# Patient Record
Sex: Male | Born: 1960 | Race: White | Hispanic: No | Smoking: Current every day smoker
Health system: Southern US, Community
[De-identification: ages and names within clinical notes are randomized; demographics above are authoritative.]

## PROBLEM LIST (undated history)

## (undated) DIAGNOSIS — E039 Hypothyroidism, unspecified: Secondary | ICD-10-CM

## (undated) DIAGNOSIS — K7030 Alcoholic cirrhosis of liver without ascites: Secondary | ICD-10-CM

## (undated) DIAGNOSIS — F411 Generalized anxiety disorder: Secondary | ICD-10-CM

## (undated) DIAGNOSIS — K612 Anorectal abscess: Secondary | ICD-10-CM

## (undated) DIAGNOSIS — K219 Gastro-esophageal reflux disease without esophagitis: Secondary | ICD-10-CM

## (undated) DIAGNOSIS — I10 Essential (primary) hypertension: Secondary | ICD-10-CM

## (undated) DIAGNOSIS — K5730 Diverticulosis of large intestine without perforation or abscess without bleeding: Secondary | ICD-10-CM

## (undated) DIAGNOSIS — G250 Essential tremor: Secondary | ICD-10-CM

## (undated) DIAGNOSIS — F1020 Alcohol dependence, uncomplicated: Secondary | ICD-10-CM

## (undated) DIAGNOSIS — K635 Polyp of colon: Secondary | ICD-10-CM

## (undated) DIAGNOSIS — F329 Major depressive disorder, single episode, unspecified: Secondary | ICD-10-CM

## (undated) HISTORY — PX: TONSILLECTOMY AND ADENOIDECTOMY: SUR1326

## (undated) HISTORY — DX: Diverticulosis of large intestine without perforation or abscess without bleeding: K57.30

## (undated) HISTORY — PX: OTHER SURGICAL HISTORY: SHX169

## (undated) HISTORY — DX: Gastro-esophageal reflux disease without esophagitis: K21.9

## (undated) HISTORY — DX: Alcohol dependence, uncomplicated: F10.20

## (undated) HISTORY — DX: Anorectal abscess: K61.2

## (undated) HISTORY — DX: Essential tremor: G25.0

## (undated) HISTORY — DX: Major depressive disorder, single episode, unspecified: F32.9

## (undated) HISTORY — DX: Essential (primary) hypertension: I10

## (undated) HISTORY — DX: Generalized anxiety disorder: F41.1

---

## 2004-03-09 ENCOUNTER — Ambulatory Visit: Payer: Self-pay | Admitting: Gastroenterology

## 2004-03-17 ENCOUNTER — Ambulatory Visit: Payer: Self-pay | Admitting: Gastroenterology

## 2004-03-24 ENCOUNTER — Ambulatory Visit: Payer: Self-pay | Admitting: Gastroenterology

## 2004-04-15 ENCOUNTER — Ambulatory Visit: Payer: Self-pay | Admitting: Gastroenterology

## 2004-10-26 ENCOUNTER — Ambulatory Visit: Payer: Self-pay | Admitting: Family Medicine

## 2004-10-30 ENCOUNTER — Observation Stay (HOSPITAL_COMMUNITY): Admission: RE | Admit: 2004-10-30 | Discharge: 2004-11-01 | Payer: Self-pay | Admitting: General Surgery

## 2004-11-20 ENCOUNTER — Ambulatory Visit: Payer: Self-pay | Admitting: Family Medicine

## 2004-12-07 ENCOUNTER — Ambulatory Visit: Payer: Self-pay | Admitting: Family Medicine

## 2005-01-07 ENCOUNTER — Ambulatory Visit: Payer: Self-pay | Admitting: Family Medicine

## 2005-01-11 ENCOUNTER — Ambulatory Visit: Payer: Self-pay | Admitting: Family Medicine

## 2005-01-27 ENCOUNTER — Ambulatory Visit: Payer: Self-pay | Admitting: Family Medicine

## 2005-01-28 ENCOUNTER — Ambulatory Visit: Payer: Self-pay | Admitting: Family Medicine

## 2005-02-04 ENCOUNTER — Ambulatory Visit: Payer: Self-pay | Admitting: Family Medicine

## 2005-03-17 ENCOUNTER — Ambulatory Visit: Payer: Self-pay | Admitting: Family Medicine

## 2006-06-07 ENCOUNTER — Ambulatory Visit: Payer: Self-pay | Admitting: Family Medicine

## 2007-03-22 ENCOUNTER — Ambulatory Visit: Payer: Self-pay | Admitting: Family Medicine

## 2008-04-24 ENCOUNTER — Telehealth: Payer: Self-pay | Admitting: *Deleted

## 2008-04-25 ENCOUNTER — Ambulatory Visit: Payer: Self-pay | Admitting: Family Medicine

## 2008-04-25 DIAGNOSIS — F329 Major depressive disorder, single episode, unspecified: Secondary | ICD-10-CM | POA: Insufficient documentation

## 2008-04-25 DIAGNOSIS — I10 Essential (primary) hypertension: Secondary | ICD-10-CM

## 2008-04-25 DIAGNOSIS — F3289 Other specified depressive episodes: Secondary | ICD-10-CM

## 2008-04-25 DIAGNOSIS — F411 Generalized anxiety disorder: Secondary | ICD-10-CM

## 2008-04-25 DIAGNOSIS — K219 Gastro-esophageal reflux disease without esophagitis: Secondary | ICD-10-CM

## 2008-04-25 HISTORY — DX: Other specified depressive episodes: F32.89

## 2008-04-25 HISTORY — DX: Gastro-esophageal reflux disease without esophagitis: K21.9

## 2008-04-25 HISTORY — DX: Major depressive disorder, single episode, unspecified: F32.9

## 2008-04-25 HISTORY — DX: Essential (primary) hypertension: I10

## 2008-04-25 HISTORY — DX: Generalized anxiety disorder: F41.1

## 2008-04-25 LAB — CONVERTED CEMR LAB
Bilirubin Urine: NEGATIVE
Glucose, Urine, Semiquant: NEGATIVE
Protein, U semiquant: NEGATIVE
Specific Gravity, Urine: 1.01
WBC Urine, dipstick: NEGATIVE
pH: 5.5

## 2008-04-30 LAB — CONVERTED CEMR LAB
AST: 51 units/L — ABNORMAL HIGH (ref 0–37)
Albumin: 3.4 g/dL — ABNORMAL LOW (ref 3.5–5.2)
Alkaline Phosphatase: 60 units/L (ref 39–117)
BUN: 9 mg/dL (ref 6–23)
Cholesterol: 196 mg/dL (ref 0–200)
Eosinophils Relative: 1.5 % (ref 0.0–5.0)
GFR calc Af Amer: 116 mL/min
Glucose, Bld: 87 mg/dL (ref 70–99)
HDL: 51.5 mg/dL (ref >39.0)
Lymphocytes Relative: 39.9 % (ref 12.0–46.0)
Monocytes Relative: 9.3 % (ref 3.0–12.0)
Neutrophils Relative %: 49.3 % (ref 43.0–77.0)
Platelets: 230 10*3/uL (ref 150–400)
Potassium: 3.7 meq/L (ref 3.5–5.1)
RDW: 13 % (ref 11.5–14.6)
Sodium: 138 meq/L (ref 135–145)
TSH: 2.32 microintl units/mL (ref 0.35–5.50)
Total CHOL/HDL Ratio: 3.8
Total Protein: 6.7 g/dL (ref 6.0–8.3)
Triglycerides: 259 mg/dL (ref 0–149)
VLDL: 52 mg/dL — ABNORMAL HIGH (ref 0–40)
WBC: 8.6 10*3/uL (ref 4.5–10.5)

## 2008-08-05 ENCOUNTER — Ambulatory Visit: Payer: Self-pay | Admitting: Family Medicine

## 2008-08-05 DIAGNOSIS — K612 Anorectal abscess: Secondary | ICD-10-CM | POA: Insufficient documentation

## 2008-11-14 ENCOUNTER — Telehealth: Payer: Self-pay | Admitting: Family Medicine

## 2009-03-17 ENCOUNTER — Encounter (INDEPENDENT_AMBULATORY_CARE_PROVIDER_SITE_OTHER): Payer: Self-pay | Admitting: *Deleted

## 2009-03-26 ENCOUNTER — Encounter: Payer: Self-pay | Admitting: Family Medicine

## 2009-05-23 DIAGNOSIS — R109 Unspecified abdominal pain: Secondary | ICD-10-CM | POA: Insufficient documentation

## 2009-05-29 ENCOUNTER — Ambulatory Visit: Payer: Self-pay | Admitting: Family Medicine

## 2009-05-29 ENCOUNTER — Emergency Department (HOSPITAL_COMMUNITY): Admission: EM | Admit: 2009-05-29 | Discharge: 2009-05-29 | Payer: Self-pay | Admitting: Emergency Medicine

## 2009-06-04 ENCOUNTER — Ambulatory Visit: Payer: Self-pay | Admitting: Family Medicine

## 2009-06-13 ENCOUNTER — Ambulatory Visit: Payer: Self-pay | Admitting: Family Medicine

## 2009-06-13 DIAGNOSIS — K5730 Diverticulosis of large intestine without perforation or abscess without bleeding: Secondary | ICD-10-CM

## 2009-06-13 HISTORY — DX: Diverticulosis of large intestine without perforation or abscess without bleeding: K57.30

## 2009-06-16 ENCOUNTER — Encounter (INDEPENDENT_AMBULATORY_CARE_PROVIDER_SITE_OTHER): Payer: Self-pay | Admitting: *Deleted

## 2010-02-06 ENCOUNTER — Emergency Department (HOSPITAL_COMMUNITY): Admission: EM | Admit: 2010-02-06 | Discharge: 2010-02-06 | Payer: Self-pay | Admitting: Emergency Medicine

## 2010-06-03 NOTE — Letter (Signed)
Summary: [facility]   Imported By: Maryln Gottron 05/28/2009 10:38:00  _____________________________________________________________________  External Attachment:    Type:   Image     Comment:   External Document

## 2010-06-03 NOTE — Assessment & Plan Note (Signed)
Summary: follow up on diverticulitus/cjr   Vital Signs:  Patient profile:   50 year old male Weight:      211 pounds Temp:     99.0 degrees F oral BP sitting:   122 / 72  (left arm) Cuff size:   regular  Vitals Entered By: Kern Reap CMA Duncan Dull) (June 04, 2009 11:22 AM)  Reason for Visit follow up office visit  History of Present Illness: Austin Padilla is a 50 year old single male, who comes in today for follow-up of diverticulitis.  He was seen on the 27th of January with severe abdominal pain.  He was referred to the emergency room.  He had a complete diagnostic workup, which showed some diverticulitis in the descending colon.  Because he was otherwise well.  White count was 11,000.  No evidence of perforation.  He was sent home on oral medication.  He did not start the oral medication for two days.  He taking Cipro and Flagyl one twice a day of each and feels 50% better.  He's on clear liquids, not eating, although his appetite is starting to come back to  Allergies: 1)  Reglan (Metoclopramide Hcl)  Past History:  Past medical, surgical, family and social histories (including risk factors) reviewed for relevance to current acute and chronic problems.  Past Medical History: Reviewed history from 04/25/2008 and no changes required. Anxiety Depression GERD Hypertension h/o etoh  Past Surgical History: Reviewed history from 08/05/2008 and no changes required. excision of perianal abcess per Dr. Johna Sheriff 2006  Family History: Reviewed history from 04/25/2008 and no changes required. Family History Depression Family History Hypertension  Social History: Reviewed history from 04/25/2008 and no changes required. Divorced Former Smoker Alcohol use-yes Drug use-no Regular exercise-no  Review of Systems      See HPI  Physical Exam  General:  Well-developed,well-nourished,in no acute distress; alert,appropriate and cooperative throughout examination Abdomen:  the  abdomen is not distended.  The bowel sounds are normal.  There is some tenderness in the left lower quadrant.  No rebound   Impression & Recommendations:  Problem # 1:  ABDOMINAL PAIN, ACUTE (ICD-789.00) Assessment Improved  Complete Medication List: 1)  Effexor Xr 150 Mg Cp24 (Venlafaxine hcl) .... 2 by mouth qam 2)  Mirtazapine 30 Mg Tabs (Mirtazapine) 3)  Protonix 40 Mg Tbec (Pantoprazole sodium) .... Take 1 tablet by mouth once a day 4)  Propranolol Hcl 20 Mg Tabs (Propranolol hcl) .... Take 1 tablet by mouth every morning 5)  Cozaar 50 Mg Tabs (Losartan potassium) .... Take one tab once daily 6)  Cipro 500 Mg Tabs (Ciprofloxacin hcl) .... Take one tab two times a day 7)  Flagyl 500 Mg Tabs (Metronidazole) .... Take one tab four times a day  Patient Instructions: 1)  advance her diet slowly.  Continue the Cipro and Flagyl one of each twice a day.  Return in one week for follow-up Prescriptions: FLAGYL 500 MG TABS (METRONIDAZOLE) take one tab four times a day  #30 x 1   Entered and Authorized by:   Roderick Pee MD   Signed by:   Roderick Pee MD on 06/04/2009   Method used:   Print then Give to Patient   RxID:   [id number] CIPRO 500 MG TABS (CIPROFLOXACIN HCL) take one tab two times a day  #30 x 1   Entered and Authorized by:   Roderick Pee MD   Signed by:   Roderick Pee MD on 06/04/2009  Method used:   Print then Give to Patient   RxID:   [id number]

## 2010-06-03 NOTE — Assessment & Plan Note (Signed)
Summary: ? food poisoning?   Vital Signs:  Patient profile:   50 year old male Height:      71 inches Weight:      216 pounds BMI:     30.23 Temp:     98.6 degrees F oral BP sitting:   130 / 70  (left arm) Cuff size:   regular  Vitals Entered By: Kern Reap CMA Duncan Dull) (May 29, 2009 11:07 AM)  Reason for Visit abdominal pain  History of Present Illness: infant, is a 50 year old single male, who comes in today for evaluation of abdominal pain  Last Friday he developed severe crampy abdominal pain and diarrhea.  The diarrhea lasted for 3 days.  He had no fever or chills, but he did have diffuse abdominal pain.  Now the diarrhea is gone, but the pain persists.  He describes the pain as constant, dull, and 8 on a scale of one to 10.  It does not radiate.  He said no nausea, vomiting, but has noticed his abdomen seems to be distended.  Urinary habits.  Normal.  Review of systems negative except that his last colonoscopy he was told he had diverticuli.  Is never had diverticulitis  Allergies: 1)  Reglan (Metoclopramide Hcl)  Past History:  Past medical, surgical, family and social histories (including risk factors) reviewed, and no changes noted (except as noted below).  Past Medical History: Reviewed history from 04/25/2008 and no changes required. Anxiety Depression GERD Hypertension h/o etoh  Past Surgical History: Reviewed history from 08/05/2008 and no changes required. excision of perianal abcess per Dr. Johna Sheriff 2006  Family History: Reviewed history from 04/25/2008 and no changes required. Family History Depression Family History Hypertension  Social History: Reviewed history from 04/25/2008 and no changes required. Divorced Former Smoker Alcohol use-yes Drug use-no Regular exercise-no  Review of Systems      See HPI  Physical Exam  General:  Well-developed,well-nourished,in no acute distress; alert,appropriate and cooperative throughout  examination Abdomen:  the abdomen is distended.  The bowel sounds are scattered and infrequent.  There is diffuse abdominal tenderness and rebound.   Impression & Recommendations:  Problem # 1:  ABDOMINAL PAIN, ACUTE (ICD-789.00) Assessment New  Complete Medication List: 1)  Effexor Xr 150 Mg Cp24 (Venlafaxine hcl) .... 2 by mouth qam 2)  Mirtazapine 30 Mg Tabs (Mirtazapine) 3)  Protonix 40 Mg Tbec (Pantoprazole sodium) .... Take 1 tablet by mouth once a day 4)  Propranolol Hcl 20 Mg Tabs (Propranolol hcl) .... Take 1 tablet by mouth every morning 5)  Cozaar 50 Mg Tabs (Losartan potassium) .... Take one tab once daily  Patient Instructions: 1)  go directly to [facility] long emergency room now

## 2010-06-03 NOTE — Letter (Signed)
Summary: New Patient letter  [facility]  [address] [place], [place] [address]   Phone: [phone]  Fax: [phone]       06/16/2009 MRN: [id number]  Austin Padilla [address] [place], [place]  [address]  Dear [name],  Welcome to the Gastroenterology Division at [facility].    You are scheduled to see Dr.  Arlyce Dice on 07-11-09 at 10am on the 3rd floor at [facility], [address]  We ask that you try to arrive at our office 15 minutes prior to your appointment time to allow for check-in.  We would like you to complete the enclosed self-administered evaluation form prior to your visit and bring it with you on the day of your appointment.  We will review it with you.  Also, please bring a complete list of all your medications or, if you prefer, bring the medication bottles and we will list them.  Please bring your insurance card so that we may make a copy of it.  If your insurance requires a referral to see a specialist, please bring your referral form from your primary care physician.  Co-payments are due at the time of your visit and may be paid by cash, check or credit card.     Your office visit will consist of a consult with your physician (includes a physical exam), any laboratory testing he/she may order, scheduling of any necessary diagnostic testing (e.g. x-ray, ultrasound, CT-scan), and scheduling of a procedure (e.g. Endoscopy, Colonoscopy) if required.  Please allow enough time on your schedule to allow for any/all of these possibilities.    If you cannot keep your appointment, please call [phone] to cancel or reschedule prior to your appointment date.  This allows [place] the opportunity to schedule an appointment for another patient in need of care.  If you do not cancel or reschedule by 5 p.m. the business day prior to your appointment date, you will be charged a $50.00 late cancellation/no-show fee.    Thank you for choosing  [facility] for your medical needs.  We appreciate the opportunity to care for you.  Please visit [place] at our website  to learn more about our practice.                     Sincerely,                                                             The Gastroenterology Division

## 2010-06-03 NOTE — Assessment & Plan Note (Signed)
Summary: 1 wk rov/njr   Vital Signs:  Patient profile:   50 year old male Weight:      220 pounds Temp:     98.8 degrees F oral BP sitting:   140 / 90  (left arm) Cuff size:   regular  Vitals Entered By: Kern Reap CMA Duncan Dull) (June 13, 2009 2:08 PM)  Reason for Visit follow up office visit  History of Present Illness: Austin Padilla is a 50 year old single male, who is starting a new job in [employer] [facility] [employer] on March 1 comes in today for follow-up of diverticulitis.  He is currently on Flagyl and Cipro one of each b.i.d. he states his pain is 90% gone.  Still little tenderness in the transverse and the descending colon.  Bowel movements are returning to normal.  He is going once or twice a day without a stool softener  Allergies: 1)  Reglan (Metoclopramide Hcl)  Past History:  Past medical, surgical, family and social histories (including risk factors) reviewed for relevance to current acute and chronic problems.  Past Medical History: Reviewed history from 04/25/2008 and no changes required. Anxiety Depression GERD Hypertension h/o etoh  Past Surgical History: Reviewed history from 08/05/2008 and no changes required. excision of perianal abcess per Dr. Johna Sheriff 2006  Family History: Reviewed history from 04/25/2008 and no changes required. Family History Depression Family History Hypertension  Social History: Reviewed history from 04/25/2008 and no changes required. Divorced Former Smoker Alcohol use-yes Drug use-no Regular exercise-no  Review of Systems      See HPI  Physical Exam  General:  Well-developed,well-nourished,in no acute distress; alert,appropriate and cooperative throughout examination Abdomen:  the abdomen is flat.  The bowel sounds are normal.  There is some tenderness with no rebound, transverse, and descending colon   Problems:  Medical Problems Added: 1)  Dx of Diverticulosis, Colon  (ICD-562.10)  Impression &  Recommendations:  Problem # 1:  DIVERTICULOSIS, COLON (ICD-562.10) Assessment Improved  Orders: Gastroenterology Referral (GI) Gastroenterology Referral (GI)  Complete Medication List: 1)  Effexor Xr 150 Mg Cp24 (Venlafaxine hcl) .... 2 by mouth qam 2)  Mirtazapine 30 Mg Tabs (Mirtazapine) 3)  Protonix 40 Mg Tbec (Pantoprazole sodium) .... Take 1 tablet by mouth once a day 4)  Propranolol Hcl 20 Mg Tabs (Propranolol hcl) .... Take 1 tablet by mouth every morning 5)  Cozaar 50 Mg Tabs (Losartan potassium) .... Take one tab once daily 6)  Cipro 500 Mg Tabs (Ciprofloxacin hcl) .... Take one tab two times a day 7)  Flagyl 500 Mg Tabs (Metronidazole) .... Take one tab four times a day  Patient Instructions: 1)  take one Cipro, and one.  Flagyl daily until you are pain free for two days.  Then stopped the medication.  We will arrange a GI consult in 8 weeks for follow-up Prescriptions: COZAAR 50 MG TABS (LOSARTAN POTASSIUM) take one tab once daily  #100 x 3   Entered and Authorized by:   Roderick Pee MD   Signed by:   Roderick Pee MD on 06/13/2009   Method used:   Electronically to        [facility]* (retail)       [address]       [place], [place]  [id number]       Ph: [phone]       Fax: [phone]   RxID:   [id number] PROTONIX 40 MG TBEC (PANTOPRAZOLE SODIUM) Take 1 tablet by mouth once a  day  #100 x 3   Entered and Authorized by:   Roderick Pee MD   Signed by:   Roderick Pee MD on 06/13/2009   Method used:   Electronically to        [facility]* (retail)       [address]       [place], [place]  [address]       Ph: [phone]       Fax: [phone]   RxID:   [id number] EFFEXOR XR 150 MG CP24 (VENLAFAXINE HCL) 2 by mouth qam  #200 x 3   Entered and Authorized by:   Roderick Pee MD   Signed by:   Roderick Pee MD on 06/13/2009   Method used:   Electronically to        [facility]* (retail)       [address]       [place], [place]  [id number]       Ph: [phone]       Fax: [phone]   RxID:   [id number]

## 2010-07-19 LAB — COMPREHENSIVE METABOLIC PANEL
Albumin: 2.7 g/dL — ABNORMAL LOW (ref 3.5–5.2)
BUN: 14 mg/dL (ref 6–23)
Creatinine, Ser: 1.18 mg/dL (ref 0.4–1.5)
GFR calc Af Amer: >60 mL/min (ref >60)
Total Bilirubin: 1 mg/dL (ref 0.3–1.2)

## 2010-07-19 LAB — ETHANOL: Alcohol, Ethyl (B): <5 mg/dL (ref 0–10)

## 2010-07-19 LAB — CBC
HCT: 44.2 % (ref 39.0–52.0)
Hemoglobin: 15.3 g/dL (ref 13.0–17.0)
RBC: 4.3 MIL/uL (ref 4.22–5.81)
WBC: 11.8 10*3/uL — ABNORMAL HIGH (ref 4.0–10.5)

## 2010-07-19 LAB — DIFFERENTIAL
Eosinophils Relative: 0 % (ref 0–5)
Monocytes Absolute: 0.8 10*3/uL (ref 0.1–1.0)
Monocytes Relative: 6 % (ref 3–12)

## 2010-07-19 LAB — HEMOCCULT GUIAC POC 1CARD (OFFICE): Fecal Occult Bld: NEGATIVE

## 2010-07-19 LAB — LIPASE, BLOOD: Lipase: 59 U/L (ref 11–59)

## 2010-08-31 ENCOUNTER — Other Ambulatory Visit: Payer: Self-pay | Admitting: Family Medicine

## 2010-09-18 NOTE — Op Note (Signed)
NAMEEDNA, GROVER             ACCOUNT NO.:  [id number]   MEDICAL RECORD NO.:  [id number]          PATIENT TYPE:  OBV   LOCATION:  1312                         FACILITY:  [facility]   PHYSICIAN:  Ollen Gross. Vernell Morgans, M.D. DATE OF BIRTH:  Sep 28, 1960   DATE OF PROCEDURE:  10/30/2004  DATE OF DISCHARGE:  11/01/2004                                 OPERATIVE REPORT   PREOPERATIVE DIAGNOSIS:  Perirectal abscess.   POSTOPERATIVE DIAGNOSES:  Perirectal abscess.   PROCEDURE:  I&D of perirectal abscess.   SURGEON:  Ollen Gross. Carolynne Edouard, M.D.   ANESTHESIA:  General endotracheal.   DESCRIPTION OF PROCEDURE:  After informed consent was obtained, the patient  was brought to the operating room, placed in the supine position on the  operating room table. After adequate induction of general anesthesia, the  patient was placed in lithotomy position and his perirectal area was prepped  with Betadine and draped in the usual sterile manner. The patient had a  large abscess in his left perirectal space. This area was opened widely  using a 10 blade knife. A large amount of purulent material was expressed,  this purulent material was cultured. Once the purulence was evacuated, the  wound itself was debrided of any dead tissue sharply with electrocautery.  Once this was accomplished, the hemostasis was achieved using the Bovie  electrocautery by fulgurating the wound. The wound was then packed with  moistened Kerlix gauze and sterile dressings were applied. The patient  tolerated the procedure well and at the end of the case, all needle, sponge  and instrument counts were correct. The patient was then awakened and taken  to recovery in stable condition.       PST/MEDQ  D:  11/01/2004  T:  11/02/2004  Job:  [id number]

## 2011-08-02 ENCOUNTER — Encounter: Payer: Self-pay | Admitting: Family Medicine

## 2011-08-03 ENCOUNTER — Encounter: Payer: Self-pay | Admitting: Family Medicine

## 2011-08-03 ENCOUNTER — Telehealth: Payer: Self-pay | Admitting: *Deleted

## 2011-08-03 ENCOUNTER — Ambulatory Visit (INDEPENDENT_AMBULATORY_CARE_PROVIDER_SITE_OTHER): Payer: 59 | Admitting: Family Medicine

## 2011-08-03 ENCOUNTER — Telehealth: Payer: Self-pay | Admitting: Gastroenterology

## 2011-08-03 VITALS — BP 140/90 | Temp 98.7°F | Ht 71.0 in

## 2011-08-03 DIAGNOSIS — R160 Hepatomegaly, not elsewhere classified: Secondary | ICD-10-CM

## 2011-08-03 LAB — BASIC METABOLIC PANEL
CO2: 29 mEq/L (ref 19–32)
Chloride: 88 mEq/L — ABNORMAL LOW (ref 96–112)
Potassium: 3.1 mEq/L — ABNORMAL LOW (ref 3.5–5.1)

## 2011-08-03 LAB — POCT URINALYSIS DIPSTICK
Blood, UA: NEGATIVE
Leukocytes, UA: NEGATIVE
Spec Grav, UA: 1.025
pH, UA: 6

## 2011-08-03 LAB — CBC WITH DIFFERENTIAL/PLATELET
Basophils Absolute: 0.1 10*3/uL (ref 0.0–0.1)
Eosinophils Relative: 0.7 % (ref 0.0–5.0)
HCT: 42 % (ref 39.0–52.0)
Hemoglobin: 14.2 g/dL (ref 13.0–17.0)
Lymphocytes Relative: 14.3 % (ref 12.0–46.0)
Lymphs Abs: 2.5 10*3/uL (ref 0.7–4.0)
Monocytes Relative: 4.9 % (ref 3.0–12.0)
Neutro Abs: 14.1 10*3/uL — ABNORMAL HIGH (ref 1.4–7.7)
RDW: 15.8 % — ABNORMAL HIGH (ref 11.5–14.6)
WBC: 17.7 10*3/uL — ABNORMAL HIGH (ref 4.5–10.5)

## 2011-08-03 LAB — HEPATIC FUNCTION PANEL: Albumin: 2.9 g/dL — ABNORMAL LOW (ref 3.5–5.2)

## 2011-08-03 MED ORDER — TRAMADOL HCL 50 MG PO TABS: ORAL_TABLET | ORAL | Status: DC

## 2011-08-03 MED ORDER — LORAZEPAM 1 MG PO TABS: 1.0000 mg | ORAL_TABLET | Freq: Two times a day (BID) | ORAL | Status: AC | PRN

## 2011-08-03 NOTE — Telephone Encounter (Signed)
Spoke with Aurther Loft and scheduled patient on 08/06/11 at 1:30 PM. She will notify patient.

## 2011-08-03 NOTE — Progress Notes (Signed)
  Subjective:    Patient ID: Austin Padilla, male    DOB: February 01, 1961, 51 y.o.   MRN: [id number]  HPI Austin Padilla is a 51 year old divorced male who comes in today for evaluation of abdominal bloating and distention for 2 weeks  He has a very long history of alcohol abuse he states he's decreased his alcohol consumption to 9 ounces of whiskey a day when the bloating started 2 weeks ago. He's gained about 5-10 pounds. No fever chills nausea vomiting or diarrhea no urinary tract symptoms. He's taken 600 mg of Motrin 3 times a day because of abdominal distention and discomfort.     Review of Systems    general and GI review of systems otherwise negative except he's had a history of diverticulitis was treated by Dr. Arlyce Dice Objective:   Physical Exam Well-developed well-nourished male in no acute distress examination the abdomen and a supine position shows it to be markedly distended. Bowel sounds normal liver was palpable 1 foot below the right costal margin it extends all the way down to the umbilicus. 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

## 2011-08-03 NOTE — Patient Instructions (Signed)
No alcohol  Stop the Motrin  Continue your other medications  Labs today  We will get you set up to see a gastroenterologist this week  Ativan 1 mg 3 times daily as needed

## 2011-08-03 NOTE — Telephone Encounter (Signed)
Requesting patient's appointment be moved up from Friday. Scheduled patient with Dr. Arlyce Dice on 08/04/11 at 1:45 PM

## 2011-08-04 ENCOUNTER — Encounter: Payer: Self-pay | Admitting: Gastroenterology

## 2011-08-04 ENCOUNTER — Telehealth: Payer: Self-pay | Admitting: Family Medicine

## 2011-08-04 ENCOUNTER — Ambulatory Visit (INDEPENDENT_AMBULATORY_CARE_PROVIDER_SITE_OTHER): Payer: 59 | Admitting: Gastroenterology

## 2011-08-04 VITALS — BP 112/70 | HR 108 | Ht 71.0 in | Wt 230.2 lb

## 2011-08-04 DIAGNOSIS — K7460 Unspecified cirrhosis of liver: Secondary | ICD-10-CM

## 2011-08-04 DIAGNOSIS — Z860100 Personal history of colon polyps, unspecified: Secondary | ICD-10-CM | POA: Insufficient documentation

## 2011-08-04 DIAGNOSIS — R160 Hepatomegaly, not elsewhere classified: Secondary | ICD-10-CM

## 2011-08-04 NOTE — Assessment & Plan Note (Addendum)
The patient has massive hepatomegaly and cholestatic liver tests. He likely has infiltrative liver disease, perhaps from fat due to alcohol, primary or metastatic liver disease. Lab values (hyperchromasia, decreased albumen) are suggestive of cirrhosis. Abdominal pain is related to this as well. It is also noteworthy that his TSH is elevated. Hypothyroidism should be ruled out although I don't think this is his primary problem.  Recommendations #1 CT of the abdomen and pelvis. I will make further recommendations pending the results of his tests. #2 check thyroid function tests-I will contact Dr. Tawanna Cooler regarding this

## 2011-08-04 NOTE — Patient Instructions (Addendum)
You have been given a separate informational sheet regarding your tobacco use, the importance of quitting and local resources to help you quit.  You have been scheduled for a CT scan of the abdomen and pelvis at [facility] ([address]---this is in the same building as Architectural technologist).   You are scheduled on 08/05/2011 at 3:00pm. You should arrive 15 minutes prior to your appointment time for registration. Please follow the written instructions below on the day of your exam:  WARNING: IF YOU ARE ALLERGIC TO IODINE/X-RAY DYE, PLEASE NOTIFY RADIOLOGY IMMEDIATELY AT [phone]! YOU WILL BE GIVEN A 13 HOUR PREMEDICATION PREP.  1) Do not eat or drink anything after 11:00am (4 hours prior to your test) 2) You have been given 2 bottles of oral contrast to drink. The solution may taste better if refrigerated, but do NOT add ice or any other liquid to this solution. Shake well before drinking.    Drink 1 bottle of contrast @ 1:00pm (2 hours prior to your exam)  Drink 1 bottle of contrast @ 2:00pm (1 hour prior to your exam)  You may take any medications as prescribed with a small amount of water except for the following: Metformin, Glucophage, Glucovance, Avandamet, Riomet, Fortamet, Actoplus Met, Janumet, Glumetza or Metaglip. The above medications must be held the day of the exam AND 48 hours after the exam.  The purpose of you drinking the oral contrast is to aid in the visualization of your intestinal tract. The contrast solution may cause some diarrhea. Before your exam is started, you will be given a small amount of fluid to drink. Depending on your individual set of symptoms, you may also receive an intravenous injection of x-ray contrast/dye. Plan on being at [facility] for 30 minutes or long, depending on the type of exam you are having performed.  If you have any questions regarding your exam or if you need to reschedule, you may call the CT department at [phone]  between the hours of 8:00 am and 5:00 pm, Monday-Friday.  ________________________________________________________________________

## 2011-08-04 NOTE — Progress Notes (Signed)
History of Present Illness: Austin Padilla is a pleasant 51 year old white male referred at the request of Dr. Tawanna Cooler for evaluation of abdominal discomfort. He has a history of alcohol abuse and admits drinking 9 whiskeys a day. He complains of mild diffuse abdominal discomfort, distention of his abdomen, and lack of energy. He's actually gained 45 pounds. There is no history of hepatitis or jaundice. He recently went 4 days without drinking without ill effects. CT scan in 2011 demonstrated some thickening of the descending colon suggestive of colitis. About 10 years ago he underwent colonoscopy where several hyperplastic polyps were removed. 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 He has rare blood on the toilet tissue. Recent laboratories pertinent for a macrocytic anemia, cholestatic liver tests and an elevated TSH.  There's been no change in his bowel habits. He denies nausea or vomiting.   Past Medical History  Diagnosis Date  . ANXIETY 04/25/2008    Qualifier: Diagnosis of  By: Tawanna Cooler MD, Eugenio Hoes   . DEPRESSION 04/25/2008    Qualifier: Diagnosis of  By: Tawanna Cooler MD, Eugenio Hoes   . HYPERTENSION 04/25/2008    Qualifier: Diagnosis of  By: Tawanna Cooler MD, Eugenio Hoes   . GERD 04/25/2008    Qualifier: Diagnosis of  By: Tawanna Cooler MD, Eugenio Hoes   . DIVERTICULOSIS, COLON 06/13/2009    Qualifier: Diagnosis of  By: Tawanna Cooler MD, Eugenio Hoes ABSCESS, PERIRECTAL 08/05/2008    Qualifier: Diagnosis of  By: Linna Darner, CMA, Cindy    . ABDOMINAL PAIN, ACUTE 05/23/2009    Qualifier: Diagnosis of  By: Tawanna Cooler MD, Eugenio Hoes   . Alcoholism   . Essential tremor    Past Surgical History  Procedure Date  . Perianal abcess   . Tonsillectomy and adenoidectomy    family history includes Hypertension in his maternal uncle and Stomach cancer in his maternal grandfather. Current Outpatient Prescriptions  Medication Sig Dispense Refill  . LORazepam (ATIVAN) 1 MG tablet Take 1 tablet (1 mg total) by mouth 2 (two) times daily as needed for anxiety.  50 tablet  1    . losartan (COZAAR) 50 MG tablet Take 50 mg by mouth daily.      . pantoprazole (PROTONIX) 40 MG tablet Take 40 mg by mouth daily.      . propranolol (INDERAL) 20 MG tablet Take 20 mg by mouth as needed.       . traMADol (ULTRAM) 50 MG tablet One half to one tab twice daily when necessary for pain  30 tablet  2  . venlafaxine (EFFEXOR-XR) 150 MG 24 hr capsule Take 150 mg by mouth 2 (two) times daily.        Allergies as of 08/04/2011 - Review Complete 08/04/2011  Allergen Reaction Noted  . Metoclopramide hcl  10/14/2006    reports that he has been smoking Cigarettes.  He has been smoking about .5 packs per day. He has never used smokeless tobacco. He reports that he drinks alcohol. He reports that he does not use illicit drugs.     Review of Systems:   He is feeling depressed and admits to poor dietary habits. Pertinent positive and negative review of systems were noted in the above HPI section. All other review of systems were otherwise negative.  Vital signs were reviewed in today's medical record Physical Exam: General: Well developed , well nourished, no acute distress Head: Normocephalic and atraumatic Eyes:  sclerae anicteric, EOMI Ears: Normal auditory acuity Mouth: No deformity or lesions Neck: Supple, no  masses or thyromegaly Lungs: Clear throughout to auscultation Heart: Regular rate and rhythm; no murmurs, rubs or bruits Abdomen: Soft, non tender; liver is hard and palpable down to the right iliac crest and to the midline. No frank nodules are palpated.  I do not appreciate gross ascites or an enlarged spleen. Rectal:deferred Musculoskeletal: Symmetrical with no gross deformities  Skin: No lesions on visible extremities; there are telangiectasias on his face and there is early palmar erythema Pulses:  Normal pulses noted Extremities: No clubbing, cyanosis, edema or deformities noted Neurological: Alert oriented x 4, grossly nonfocal Cervical Nodes:  No significant  cervical adenopathy Inguinal Nodes: No significant inguinal adenopathy Psychological:  Alert and cooperative. Normal mood and affect

## 2011-08-04 NOTE — Telephone Encounter (Signed)
[facility] Triage Call Report Triage Record Num: [id number] Operator: Remonia Richter Patient Name: Austin Padilla Call Date & Time: 08/03/2011 7:28:44PM Patient Phone: [phone] PCP: Tinnie Gens A. Todd Patient Gender: Male PCP Fax : [phone] Patient DOB: February 09, 1961 Practice Name: [facility] Reason for Call: Caller: [name]; PCP: Kelle Darting A.; CB#: [phone]; Call regarding [name] is calling from [place] regarding a ammonia level of 83, abnormal, not critical,, lab faxed to office per office protocol Protocol(s) Used: PCP Calls, No Triage (Adult) Recommended Outcome per Protocol: Call Provider within 24 Hours Reason for Outcome: Lab calling with test results Care Advice: ~ 04/

## 2011-08-05 ENCOUNTER — Other Ambulatory Visit: Payer: 59

## 2011-08-06 ENCOUNTER — Ambulatory Visit: Payer: 59 | Admitting: Nurse Practitioner

## 2011-08-09 ENCOUNTER — Ambulatory Visit (INDEPENDENT_AMBULATORY_CARE_PROVIDER_SITE_OTHER)
Admission: RE | Admit: 2011-08-09 | Discharge: 2011-08-09 | Disposition: A | Discharge status: Home / Self Care | Payer: 59 | Source: Ambulatory Visit | Attending: Cardiovascular Disease | Admitting: Cardiovascular Disease

## 2011-08-09 DIAGNOSIS — R160 Hepatomegaly, not elsewhere classified: Secondary | ICD-10-CM

## 2011-08-09 DIAGNOSIS — K7460 Unspecified cirrhosis of liver: Secondary | ICD-10-CM

## 2011-08-09 MED ORDER — IOHEXOL 300 MG/ML  SOLN
100.0000 mL | Freq: Once | INTRAMUSCULAR | Status: AC | PRN
  Administered 2011-08-09: 100 mL via INTRAVENOUS

## 2011-08-11 ENCOUNTER — Other Ambulatory Visit: Payer: Self-pay | Admitting: Gastroenterology

## 2011-08-11 NOTE — Telephone Encounter (Signed)
Pt is calling wanting results of CT scan and if he needs a follow-up OV. Please advise.

## 2011-08-16 ENCOUNTER — Telehealth: Payer: Self-pay | Admitting: Gastroenterology

## 2011-08-16 NOTE — Telephone Encounter (Signed)
Pt had CT scan and wanted to know results. Per Dr. Arlyce Dice pt has cirrhosis and needs an EGD. Pt aware and will call him back when procedure scheduled.

## 2011-08-17 ENCOUNTER — Other Ambulatory Visit: Payer: Self-pay | Admitting: Gastroenterology

## 2011-08-20 ENCOUNTER — Other Ambulatory Visit: Payer: Self-pay | Admitting: Gastroenterology

## 2011-08-20 ENCOUNTER — Telehealth: Payer: Self-pay

## 2011-08-20 NOTE — Telephone Encounter (Signed)
Message copied by Chrystie Nose on Fri Aug 20, 2011  9:46 AM ------      Message from: [name] D      Created: Fri Aug 20, 2011  8:56 AM       No problem.        Bonita Quin, he needs an alpha feto protein when he is here next.      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 He clearly has massive hepatomegaly and I have schedule an abdominal CT. His lab is noteworthy for an elevated TSH. I thought that you might want to check his thyroid tests but I will leave that up to you. Please obtain an alpha-fetoprotein when you order his blood work.            Thanks for sending this for very pleasant patient my way.      Rob

## 2011-08-20 NOTE — Telephone Encounter (Signed)
Pt set up for AFP 08/27/11 at [facility] when he has his EGD with banding. Orders entered.

## 2011-08-27 ENCOUNTER — Encounter (HOSPITAL_COMMUNITY)
Admission: RE | Disposition: A | Discharge status: Home / Self Care | Payer: Self-pay | Source: Ambulatory Visit | Attending: Gastroenterology

## 2011-08-27 ENCOUNTER — Encounter (HOSPITAL_COMMUNITY): Payer: Self-pay

## 2011-08-27 ENCOUNTER — Ambulatory Visit (HOSPITAL_COMMUNITY)
Admission: RE | Admit: 2011-08-27 | Discharge: 2011-08-27 | Disposition: A | Discharge status: Home / Self Care | Payer: 59 | Source: Ambulatory Visit | Attending: Gastroenterology | Admitting: Gastroenterology

## 2011-08-27 ENCOUNTER — Encounter: Payer: Self-pay | Admitting: *Deleted

## 2011-08-27 ENCOUNTER — Telehealth: Payer: Self-pay | Admitting: *Deleted

## 2011-08-27 DIAGNOSIS — K219 Gastro-esophageal reflux disease without esophagitis: Secondary | ICD-10-CM | POA: Insufficient documentation

## 2011-08-27 DIAGNOSIS — K766 Portal hypertension: Secondary | ICD-10-CM | POA: Insufficient documentation

## 2011-08-27 DIAGNOSIS — Z79899 Other long term (current) drug therapy: Secondary | ICD-10-CM | POA: Insufficient documentation

## 2011-08-27 DIAGNOSIS — K7460 Unspecified cirrhosis of liver: Secondary | ICD-10-CM

## 2011-08-27 DIAGNOSIS — I789 Disease of capillaries, unspecified: Secondary | ICD-10-CM | POA: Insufficient documentation

## 2011-08-27 DIAGNOSIS — K319 Disease of stomach and duodenum, unspecified: Secondary | ICD-10-CM | POA: Insufficient documentation

## 2011-08-27 DIAGNOSIS — I10 Essential (primary) hypertension: Secondary | ICD-10-CM | POA: Insufficient documentation

## 2011-08-27 DIAGNOSIS — K7030 Alcoholic cirrhosis of liver without ascites: Secondary | ICD-10-CM | POA: Diagnosis present

## 2011-08-27 HISTORY — PX: ESOPHAGOGASTRODUODENOSCOPY: SHX5428

## 2011-08-27 LAB — BASIC METABOLIC PANEL
BUN: 4 mg/dL — ABNORMAL LOW (ref 6–23)
Calcium: 8.5 mg/dL (ref 8.4–10.5)
GFR calc non Af Amer: >90 mL/min (ref >90)
Glucose, Bld: 133 mg/dL — ABNORMAL HIGH (ref 70–99)
Potassium: 2.8 mEq/L — ABNORMAL LOW (ref 3.5–5.1)

## 2011-08-27 SURGERY — EGD (ESOPHAGOGASTRODUODENOSCOPY)
Anesthesia: Moderate Sedation

## 2011-08-27 MED ORDER — POTASSIUM CHLORIDE 10 MEQ/100ML IV SOLN
10.0000 meq | INTRAVENOUS | Status: DC
  Administered 2011-08-27: 10 meq via INTRAVENOUS
  Filled 2011-08-27 (×3): qty 100

## 2011-08-27 MED ORDER — POTASSIUM CHLORIDE CRYS ER 20 MEQ PO TBCR
40.0000 meq | EXTENDED_RELEASE_TABLET | Freq: Once | ORAL | Status: AC
  Administered 2011-08-27: 40 meq via ORAL

## 2011-08-27 MED ORDER — MIDAZOLAM HCL 10 MG/2ML IJ SOLN
INTRAMUSCULAR | Status: DC | PRN
  Administered 2011-08-27 (×5): 2 mg via INTRAVENOUS

## 2011-08-27 MED ORDER — GLYCOPYRROLATE 0.2 MG/ML IJ SOLN
INTRAMUSCULAR | Status: AC
  Filled 2011-08-27: qty 1

## 2011-08-27 MED ORDER — FENTANYL NICU IV SYRINGE 50 MCG/ML
INJECTION | INTRAMUSCULAR | Status: DC | PRN
  Administered 2011-08-27 (×4): 25 ug via INTRAVENOUS

## 2011-08-27 MED ORDER — DIPHENHYDRAMINE HCL 50 MG/ML IJ SOLN
INTRAMUSCULAR | Status: AC
  Filled 2011-08-27: qty 1

## 2011-08-27 MED ORDER — MIDAZOLAM HCL 10 MG/2ML IJ SOLN
INTRAMUSCULAR | Status: AC
  Filled 2011-08-27: qty 4

## 2011-08-27 MED ORDER — FENTANYL CITRATE 0.05 MG/ML IJ SOLN
INTRAMUSCULAR | Status: AC
  Filled 2011-08-27: qty 4

## 2011-08-27 MED ORDER — SODIUM CHLORIDE 0.9 % IV SOLN
Freq: Once | INTRAVENOUS | Status: AC
  Administered 2011-08-27: 09:00:00 via INTRAVENOUS

## 2011-08-27 MED ORDER — BUTAMBEN-TETRACAINE-BENZOCAINE 2-2-14 % EX AERO
INHALATION_SPRAY | CUTANEOUS | Status: DC | PRN
  Administered 2011-08-27: 2 via TOPICAL

## 2011-08-27 MED ORDER — POTASSIUM CHLORIDE 10 MEQ/100ML IV SOLN
10.0000 meq | Freq: Once | INTRAVENOUS | Status: AC
  Administered 2011-08-27: 10 meq via INTRAVENOUS

## 2011-08-27 MED ORDER — DIPHENHYDRAMINE HCL 50 MG/ML IJ SOLN
INTRAMUSCULAR | Status: DC | PRN
  Administered 2011-08-27 (×2): 25 mg via INTRAVENOUS

## 2011-08-27 NOTE — Progress Notes (Signed)
Labs on pt from 08/03/2011 show k 3.1 and TSH 16.18. Pt  States that he has not taken any potassum since lab was drawn. Pt states Dr. Tawanna Cooler has not said anything to him about his TSH level. This was reported to Dr. Arlyce Dice and a T4 and a bmet was drawn and sent to lab.  Macy Mis

## 2011-08-27 NOTE — Interval H&P Note (Signed)
History and Physical Interval Note:  08/27/2011 9:51 AM  Austin Padilla  has presented today for surgery, with the diagnosis of Cirrhosis [571.5]  The various methods of treatment have been discussed with the patient and family. After consideration of risks, benefits and other options for treatment, the patient has consented to  Procedure(s) (LRB): ESOPHAGOGASTRODUODENOSCOPY (EGD) (N/A) ESOPHAGEAL BANDING (N/A) as a surgical intervention .  The patients' history has been reviewed, patient examined, no change in status, stable for surgery.  I have reviewed the patients' chart and labs.  Questions were answered to the patient's satisfaction.    The recent H&P (dated *08/04/11**) was reviewed, the patient was examined and there is no change in the patients condition since that H&P was completed.   [name]  08/27/2011, 9:51 AM    [name]

## 2011-08-27 NOTE — Telephone Encounter (Signed)
Mediatation called into the pharmacy  Put orders in system for BMET

## 2011-08-27 NOTE — H&P (View-Only) (Signed)
History of Present Illness: Austin Padilla is a pleasant 50-year-old white male referred at the request of Dr. Todd for evaluation of abdominal discomfort. He has a history of alcohol abuse and admits drinking 9 whiskeys a day. He complains of mild diffuse abdominal discomfort, distention of his abdomen, and lack of energy. He's actually gained 45 pounds. There is no history of hepatitis or jaundice. He recently went 4 days without drinking without ill effects. CT scan in 2011 demonstrated some thickening of the descending colon suggestive of colitis. About 10 years ago he underwent colonoscopy where several hyperplastic polyps were removed. 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 He has rare blood on the toilet tissue. Recent laboratories pertinent for a macrocytic anemia, cholestatic liver tests and an elevated TSH.  There's been no change in his bowel habits. He denies nausea or vomiting.   Past Medical History  Diagnosis Date  . ANXIETY 04/25/2008    Qualifier: Diagnosis of  By: Todd MD, Jeffrey A   . DEPRESSION 04/25/2008    Qualifier: Diagnosis of  By: Todd MD, Jeffrey A   . HYPERTENSION 04/25/2008    Qualifier: Diagnosis of  By: Todd MD, Jeffrey A   . GERD 04/25/2008    Qualifier: Diagnosis of  By: Todd MD, Jeffrey A   . DIVERTICULOSIS, COLON 06/13/2009    Qualifier: Diagnosis of  By: Todd MD, Jeffrey A   . ABSCESS, PERIRECTAL 08/05/2008    Qualifier: Diagnosis of  By: Wyrick, CMA, Cindy    . ABDOMINAL PAIN, ACUTE 05/23/2009    Qualifier: Diagnosis of  By: Todd MD, Jeffrey A   . Alcoholism   . Essential tremor    Past Surgical History  Procedure Date  . Perianal abcess   . Tonsillectomy and adenoidectomy    family history includes Hypertension in his maternal uncle and Stomach cancer in his maternal grandfather. Current Outpatient Prescriptions  Medication Sig Dispense Refill  . LORazepam (ATIVAN) 1 MG tablet Take 1 tablet (1 mg total) by mouth 2 (two) times daily as needed for anxiety.  50 tablet  1    . losartan (COZAAR) 50 MG tablet Take 50 mg by mouth daily.      . pantoprazole (PROTONIX) 40 MG tablet Take 40 mg by mouth daily.      . propranolol (INDERAL) 20 MG tablet Take 20 mg by mouth as needed.       . traMADol (ULTRAM) 50 MG tablet One half to one tab twice daily when necessary for pain  30 tablet  2  . venlafaxine (EFFEXOR-XR) 150 MG 24 hr capsule Take 150 mg by mouth 2 (two) times daily.        Allergies as of 08/04/2011 - Review Complete 08/04/2011  Allergen Reaction Noted  . Metoclopramide hcl  10/14/2006    reports that he has been smoking Cigarettes.  He has been smoking about .5 packs per day. He has never used smokeless tobacco. He reports that he drinks alcohol. He reports that he does not use illicit drugs.     Review of Systems:   He is feeling depressed and admits to poor dietary habits. Pertinent positive and negative review of systems were noted in the above HPI section. All other review of systems were otherwise negative.  Vital signs were reviewed in today's medical record Physical Exam: General: Well developed , well nourished, no acute distress Head: Normocephalic and atraumatic Eyes:  sclerae anicteric, EOMI Ears: Normal auditory acuity Mouth: No deformity or lesions Neck: Supple, no   masses or thyromegaly Lungs: Clear throughout to auscultation Heart: Regular rate and rhythm; no murmurs, rubs or bruits Abdomen: Soft, non tender; liver is hard and palpable down to the right iliac crest and to the midline. No frank nodules are palpated.  I do not appreciate gross ascites or an enlarged spleen. Rectal:deferred Musculoskeletal: Symmetrical with no gross deformities  Skin: No lesions on visible extremities; there are telangiectasias on his face and there is early palmar erythema Pulses:  Normal pulses noted Extremities: No clubbing, cyanosis, edema or deformities noted Neurological: Alert oriented x 4, grossly nonfocal Cervical Nodes:  No significant  cervical adenopathy Inguinal Nodes: No significant inguinal adenopathy Psychological:  Alert and cooperative. Normal mood and affect       

## 2011-08-27 NOTE — Op Note (Signed)
[facility] [address] [place], [place]  [address]  ENDOSCOPY PROCEDURE REPORT  PATIENT:  Austin Padilla, Austin Padilla  MR#:  [id number] BIRTHDATE:  1960-12-21, 50 yrs. old  GENDER:  male  ENDOSCOPIST:  Barbette Hair. Arlyce Dice, MD Referred by:  Eugenio Hoes Tawanna Cooler, M.D.  PROCEDURE DATE:  08/27/2011 PROCEDURE:  EGD, diagnostic 43235 ASA CLASS:  Class II INDICATIONS:  Evaluate for esophageal varices in a patient with portal hypertension and/or cirrhosis.  MEDICATIONS:   These medications were titrated to patient response per physician's verbal order, Fentanyl 100 mcg IV, Versed 10 mg IV, Benadryl 50 mg IV TOPICAL ANESTHETIC:  Cetacaine Spray  DESCRIPTION OF PROCEDURE:   After the risks and benefits of the procedure were explained, informed consent was obtained.  The EG-[id number] ([id number]) endoscope was introduced through the mouth and advanced to the third portion of the duodenum.  The instrument was slowly withdrawn as the mucosa was fully examined. <<PROCEDUREIMAGES>>  portal gastropathy (see image3 and image2). Moderately severe edematous folds in the gastric cardia and fundus  Otherwise the examination was normal. No varices are seen (see image1, image5, image6, and image7).    Retroflexed views revealed Retroflexion exam demonstrated findings as previously described.    The scope was then withdrawn from the patient and the procedure completed.  COMPLICATIONS:  None  ENDOSCOPIC IMPRESSION: 1) Portal  hypertensive gastropathy 2) Otherwise normal examination 3) Retroflexion exam demonstrated findings as previously described. RECOMMENDATIONS: 1) Call office next 2-3 days to schedule an office appointment for 3 weeks 2) EGD 1 year  ______________________________ Barbette Hair. Arlyce Dice, MD  CC:  n. eSIGNED:   Barbette Hair. Yanique Mulvihill at 08/27/2011 10:22 AM  Fran Lowes, [phone]

## 2011-08-27 NOTE — Telephone Encounter (Signed)
Kcl tabs - 3 tabs qd x 15 Needs bmet in 2 weeks

## 2011-08-27 NOTE — Telephone Encounter (Signed)
Dr Arlyce Dice, Pharmacy called and ask was this patient suppose to receive a potassium prescription today. Doesn't look like anything was sent from the hospital today

## 2011-08-27 NOTE — Discharge Instructions (Signed)
Esophagogastroduodenoscopy This is an endoscopic procedure (a procedure that uses a device like a flexible telescope) that allows your caregiver to view the upper stomach and small bowel. This test allows your caregiver to look at the esophagus. The esophagus carries food from your mouth to your stomach. They can also look at your duodenum. This is the first part of the small intestine that attaches to the stomach. This test is used to detect problems in the bowel such as ulcers and inflammation. PREPARATION FOR TEST Nothing to eat after midnight the day before the test. NORMAL FINDINGS Normal esophagus, stomach, and duodenum. Ranges for normal findings may vary among different laboratories and hospitals. You should always check with your doctor after having lab work or other tests done to discuss the meaning of your test results and whether your values are considered within normal limits. MEANING OF TEST  Your caregiver will go over the test results with you and discuss the importance and meaning of your results, as well as treatment options and the need for additional tests if necessary. OBTAINING THE TEST RESULTS It is your responsibility to obtain your test results. Ask the lab or department performing the test when and how you will get your results. Document Released: 08/20/2004 Document Revised: 04/08/2011 Document Reviewed: 03/29/2008 [facility] Patient Information 2012 [place], [place].

## 2011-08-30 ENCOUNTER — Encounter (HOSPITAL_COMMUNITY): Payer: Self-pay | Admitting: Gastroenterology

## 2011-09-02 ENCOUNTER — Telehealth: Payer: Self-pay | Admitting: Gastroenterology

## 2011-09-02 NOTE — Telephone Encounter (Signed)
Pt was calling regarding follow-up appt. Pt already has OV for 09/17/11@9 :30am. Pt aware of appt date and time.

## 2011-09-17 ENCOUNTER — Other Ambulatory Visit: Payer: Self-pay | Admitting: Gastroenterology

## 2011-09-17 ENCOUNTER — Encounter: Payer: Self-pay | Admitting: Gastroenterology

## 2011-09-17 ENCOUNTER — Other Ambulatory Visit (INDEPENDENT_AMBULATORY_CARE_PROVIDER_SITE_OTHER): Payer: 59

## 2011-09-17 ENCOUNTER — Ambulatory Visit (INDEPENDENT_AMBULATORY_CARE_PROVIDER_SITE_OTHER): Payer: 59 | Admitting: Gastroenterology

## 2011-09-17 VITALS — BP 94/60 | HR 128 | Ht 71.0 in | Wt 222.1 lb

## 2011-09-17 DIAGNOSIS — K759 Inflammatory liver disease, unspecified: Secondary | ICD-10-CM

## 2011-09-17 LAB — BASIC METABOLIC PANEL
CO2: 25 mEq/L (ref 19–32)
Calcium: 8.4 mg/dL (ref 8.4–10.5)
Chloride: 100 mEq/L (ref 96–112)
Sodium: 136 mEq/L (ref 135–145)

## 2011-09-17 LAB — IBC PANEL
Iron: 52 ug/dL (ref 42–165)
Saturation Ratios: 17.5 % — ABNORMAL LOW (ref 20.0–50.0)

## 2011-09-17 LAB — FERRITIN: Ferritin: 83.3 ng/mL (ref 22.0–322.0)

## 2011-09-17 NOTE — Progress Notes (Signed)
History of Present Illness:  The patient returns following upper endoscopy. No varices were seen. He had mild portal hypertensive gastropathy. We had a lengthy discussion about cirrhosis and alcohol use. He is trying to wean from alcohol. His main complaint is abdominal fullness, especially postprandially. Was also noted to be hypokalemic and was placed on potassium supplementation.    Review of Systems: Pertinent positive and negative review of systems were noted in the above HPI section. All other review of systems were otherwise negative.    Current Medications, Allergies, Past Medical History, Past Surgical History, Family History and Social History were reviewed in [employer] electronic medical record  Vital signs were reviewed in today's medical record. Physical Exam: General: Well developed , well nourished, no acute distress

## 2011-09-17 NOTE — Assessment & Plan Note (Signed)
He clearly has a well-established cirrhosis as evidenced by portal hypertensive gastropathy and hypoalbuminemia. Synthetic function is good.  We had a lengthy discussion about alcoholism and its contribution to cirrhosis. He indicated a willingness to abstain from alcohol.  Recommendations #1 electrolyte panel and a urine electrolytes #2 check serologies for hepatitis B and C; check iron, TIBC and ferritin levels, ANA and ANA and ceruloplasmin levels #3 vaccinations for hepatitis B and C. and Pneumovax, pending results of serologies #4 followup endoscopy in one year

## 2011-09-17 NOTE — Patient Instructions (Addendum)
You have been given a separate informational sheet regarding your tobacco use, the importance of quitting and local resources to help you quit. Please go to the basement for labs today  Follow up in 6 months

## 2011-09-20 LAB — ANA: Anti Nuclear Antibody(ANA): NEGATIVE

## 2011-09-20 LAB — ANTI-SMOOTH MUSCLE ANTIBODY, IGG: Smooth Muscle Ab: 13 U (ref <20)

## 2011-09-20 LAB — HEPATITIS PANEL, ACUTE: HCV Ab: NEGATIVE

## 2011-09-21 LAB — MITOCHONDRIAL ANTIBODIES: Mitochondrial M2 Ab, IgG: 0.46 (ref <0.91)

## 2011-09-22 LAB — NA AND K (SODIUM & POTASSIUM), RAND UR
Potassium Urine: 42 mEq/L
Sodium, Ur: 32 mEq/L

## 2011-09-22 LAB — CHLORIDE, URINE, RANDOM: Chloride Urine: 36 mEq/L

## 2011-09-29 ENCOUNTER — Telehealth: Payer: Self-pay | Admitting: Gastroenterology

## 2011-09-29 NOTE — Telephone Encounter (Signed)
Pt called and is requesting lab results. Please advise.

## 2011-09-30 NOTE — Telephone Encounter (Signed)
Pt aware.

## 2011-09-30 NOTE — Telephone Encounter (Signed)
Pt aware. Per OV note pt is to follow-up in and also possibly get hep b and c vaccine. Pt wants to know if he can just do those at the time of his next OV. Please advise.

## 2011-09-30 NOTE — Telephone Encounter (Signed)
Ok

## 2011-09-30 NOTE — Telephone Encounter (Signed)
Lab work was unrevealing. He tests indicate that there is no other cause for his liver disease beyond alcohol

## 2011-11-16 ENCOUNTER — Telehealth: Payer: Self-pay | Admitting: Family Medicine

## 2011-11-16 DIAGNOSIS — R160 Hepatomegaly, not elsewhere classified: Secondary | ICD-10-CM

## 2011-11-16 MED ORDER — TRAMADOL HCL 50 MG PO TABS: ORAL_TABLET | ORAL | Status: DC

## 2011-11-16 NOTE — Telephone Encounter (Signed)
Tramadol 50 mg dispense 60 tabs directions 1/2-1 tablet twice a day when necessary for pain refills x2

## 2011-11-16 NOTE — Telephone Encounter (Signed)
Pt. Calling, needs a refill on his Tramadol.  States that his pharmacy faxed over a refill request on Monday, 7/15.  He is going OOT and is out of the medication.  Last refilled on 6/27, taking 1 po bid.

## 2011-12-02 ENCOUNTER — Ambulatory Visit: Payer: 59 | Admitting: Family Medicine

## 2011-12-06 ENCOUNTER — Encounter: Payer: Self-pay | Admitting: Family Medicine

## 2011-12-06 ENCOUNTER — Ambulatory Visit (INDEPENDENT_AMBULATORY_CARE_PROVIDER_SITE_OTHER): Payer: 59 | Admitting: Family Medicine

## 2011-12-06 VITALS — BP 160/98 | Temp 98.7°F | Wt 238.0 lb

## 2011-12-06 DIAGNOSIS — R7989 Other specified abnormal findings of blood chemistry: Secondary | ICD-10-CM

## 2011-12-06 DIAGNOSIS — R160 Hepatomegaly, not elsewhere classified: Secondary | ICD-10-CM

## 2011-12-06 DIAGNOSIS — K7030 Alcoholic cirrhosis of liver without ascites: Secondary | ICD-10-CM

## 2011-12-06 DIAGNOSIS — I10 Essential (primary) hypertension: Secondary | ICD-10-CM

## 2011-12-06 DIAGNOSIS — R946 Abnormal results of thyroid function studies: Secondary | ICD-10-CM

## 2011-12-06 MED ORDER — PANTOPRAZOLE SODIUM 40 MG PO TBEC: 40.0000 mg | DELAYED_RELEASE_TABLET | Freq: Every day | ORAL | Status: DC

## 2011-12-06 MED ORDER — TRAMADOL HCL 50 MG PO TABS: ORAL_TABLET | ORAL | Status: DC

## 2011-12-06 MED ORDER — LOSARTAN POTASSIUM 50 MG PO TABS: 50.0000 mg | ORAL_TABLET | Freq: Every day | ORAL | Status: DC

## 2011-12-06 MED ORDER — VENLAFAXINE HCL ER 150 MG PO CP24: 150.0000 mg | ORAL_CAPSULE | Freq: Two times a day (BID) | ORAL | Status: DC

## 2011-12-06 NOTE — Patient Instructions (Signed)
Restart your blood pressure medication today  I will call you when I get your lab work back

## 2011-12-06 NOTE — Progress Notes (Signed)
  Subjective:    Patient ID: Austin Padilla, male    DOB: October 16, 1960, 51 y.o.   MRN: [id number]  HPI Lisle is a 51 year old single male with underlying alcoholic cirrhosis and a very enlarged liver who comes in today for followup. We initially saw him in the spring and referred him to Dr. Arlyce Dice. Dr. Arlyce Dice did a complete workup. At that time he was told his potassium level was low he was given a supplement potassium 2 months ago came up to 3.6.  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

## 2011-12-07 ENCOUNTER — Other Ambulatory Visit: Payer: Self-pay | Admitting: Family Medicine

## 2011-12-07 DIAGNOSIS — D649 Anemia, unspecified: Secondary | ICD-10-CM

## 2011-12-07 LAB — T3, FREE: T3, Free: 3.2 pg/mL (ref 2.3–4.2)

## 2011-12-07 LAB — HEPATIC FUNCTION PANEL
AST: 37 U/L (ref 0–37)
Alkaline Phosphatase: 179 U/L — ABNORMAL HIGH (ref 39–117)
Bilirubin, Direct: 0.5 mg/dL — ABNORMAL HIGH (ref 0.0–0.3)
Total Bilirubin: 1.8 mg/dL — ABNORMAL HIGH (ref 0.3–1.2)

## 2011-12-07 LAB — CBC WITH DIFFERENTIAL/PLATELET
Basophils Absolute: 0.1 10*3/uL (ref 0.0–0.1)
HCT: 31.6 % — ABNORMAL LOW (ref 39.0–52.0)
Hemoglobin: 10.8 g/dL — ABNORMAL LOW (ref 13.0–17.0)
Lymphs Abs: 1.6 10*3/uL (ref 0.7–4.0)
MCV: 96.3 fl (ref 78.0–100.0)
Monocytes Absolute: 0.4 10*3/uL (ref 0.1–1.0)
Monocytes Relative: 6.8 % (ref 3.0–12.0)
Neutro Abs: 3.3 10*3/uL (ref 1.4–7.7)
Platelets: 204 10*3/uL (ref 150.0–400.0)
RDW: 16.1 % — ABNORMAL HIGH (ref 11.5–14.6)

## 2011-12-07 LAB — BASIC METABOLIC PANEL
BUN: 8 mg/dL (ref 6–23)
CO2: 24 mEq/L (ref 19–32)
Calcium: 8.2 mg/dL — ABNORMAL LOW (ref 8.4–10.5)
Chloride: 96 mEq/L (ref 96–112)
Creatinine, Ser: 0.8 mg/dL (ref 0.4–1.5)

## 2011-12-07 LAB — TSH: TSH: 4.11 u[IU]/mL (ref 0.35–5.50)

## 2011-12-07 LAB — T4, FREE: Free T4: 1.59 ng/dL (ref 0.60–1.60)

## 2011-12-07 MED ORDER — POTASSIUM CHLORIDE CRYS ER 20 MEQ PO TBCR: 20.0000 meq | EXTENDED_RELEASE_TABLET | Freq: Every day | ORAL | Status: DC

## 2011-12-10 ENCOUNTER — Telehealth: Payer: Self-pay | Admitting: Gastroenterology

## 2011-12-10 ENCOUNTER — Ambulatory Visit: Payer: 59 | Admitting: Gastroenterology

## 2011-12-10 NOTE — Telephone Encounter (Signed)
Pt states he was having diarrhea today and had to cancel his appt with Dr. Arlyce Dice today. Pt rescheduled to see Amy Esterwood PA 12/13/11@3 :30pm. Pt states Dr. Tawanna Cooler requested he be seen due to drop in Hgb. Pt aware of appt date and time.

## 2011-12-13 ENCOUNTER — Ambulatory Visit: Payer: 59 | Admitting: Physician Assistant

## 2011-12-13 ENCOUNTER — Ambulatory Visit: Payer: 59 | Admitting: Family Medicine

## 2012-01-19 ENCOUNTER — Ambulatory Visit: Payer: 59 | Admitting: Gastroenterology

## 2012-04-06 ENCOUNTER — Telehealth: Payer: Self-pay | Admitting: Gastroenterology

## 2012-04-06 NOTE — Telephone Encounter (Signed)
Pt scheduled OV follow-up with Dr. Arlyce Dice 05/08/12@10 :30am. Pt wants to know if he should come for labs prior to his appt. Dr. Arlyce Dice please advise what labs you would want if any.

## 2012-04-07 ENCOUNTER — Other Ambulatory Visit: Payer: Self-pay | Admitting: Gastroenterology

## 2012-04-07 DIAGNOSIS — K7460 Unspecified cirrhosis of liver: Secondary | ICD-10-CM

## 2012-04-07 NOTE — Telephone Encounter (Signed)
Cbc, INR, CMET

## 2012-04-07 NOTE — Telephone Encounter (Signed)
Pt aware and labs ordered 

## 2012-05-08 ENCOUNTER — Other Ambulatory Visit (INDEPENDENT_AMBULATORY_CARE_PROVIDER_SITE_OTHER): Payer: 59

## 2012-05-08 ENCOUNTER — Encounter: Payer: Self-pay | Admitting: Gastroenterology

## 2012-05-08 ENCOUNTER — Ambulatory Visit (INDEPENDENT_AMBULATORY_CARE_PROVIDER_SITE_OTHER): Payer: 59 | Admitting: Gastroenterology

## 2012-05-08 VITALS — BP 162/90 | HR 120 | Ht 71.0 in | Wt 227.2 lb

## 2012-05-08 DIAGNOSIS — K7460 Unspecified cirrhosis of liver: Secondary | ICD-10-CM

## 2012-05-08 DIAGNOSIS — K743 Primary biliary cirrhosis: Secondary | ICD-10-CM

## 2012-05-08 DIAGNOSIS — Z23 Encounter for immunization: Secondary | ICD-10-CM

## 2012-05-08 DIAGNOSIS — K7030 Alcoholic cirrhosis of liver without ascites: Secondary | ICD-10-CM

## 2012-05-08 DIAGNOSIS — Z86010 Personal history of colonic polyps: Secondary | ICD-10-CM

## 2012-05-08 DIAGNOSIS — K745 Biliary cirrhosis, unspecified: Secondary | ICD-10-CM

## 2012-05-08 LAB — CBC WITH DIFFERENTIAL/PLATELET
Eosinophils Relative: 1.8 % (ref 0.0–5.0)
HCT: 38 % — ABNORMAL LOW (ref 39.0–52.0)
Lymphs Abs: 2.1 10*3/uL (ref 0.7–4.0)
Monocytes Relative: 7.9 % (ref 3.0–12.0)
Neutrophils Relative %: 67.4 % (ref 43.0–77.0)
Platelets: 240 10*3/uL (ref 150.0–400.0)
RBC: 4.29 Mil/uL (ref 4.22–5.81)
WBC: 9.1 10*3/uL (ref 4.5–10.5)

## 2012-05-08 LAB — PROTIME-INR
INR: 1.1 ratio — ABNORMAL HIGH (ref 0.8–1.0)
Prothrombin Time: 11.9 s (ref 10.2–12.4)

## 2012-05-08 LAB — COMPREHENSIVE METABOLIC PANEL
ALT: 25 U/L (ref 0–53)
AST: 33 U/L (ref 0–37)
Albumin: 3.6 g/dL (ref 3.5–5.2)
Calcium: 9.1 mg/dL (ref 8.4–10.5)
Chloride: 99 mEq/L (ref 96–112)
Potassium: 3.8 mEq/L (ref 3.5–5.1)

## 2012-05-08 MED ORDER — NA SULFATE-K SULFATE-MG SULF 17.5-3.13-1.6 GM/177ML PO SOLN: 1.0000 | Freq: Once | ORAL | Status: DC

## 2012-05-08 MED ORDER — NADOLOL 40 MG PO TABS: 40.0000 mg | ORAL_TABLET | Freq: Every day | ORAL | Status: DC

## 2012-05-08 NOTE — Patient Instructions (Addendum)
You have been scheduled for a colonoscopy Separate instructions have been given You received a Flu Vaccine today You received your first HepA/B injection today You are scheduled for your second Twinrix injection on 05/15/2012 at 2:15pm You will need to schedule a 21-30 day vaccine You will need your 4th vaccine in one year from the date of the first vaccine You Recall Endoscopy is due April 2014

## 2012-05-08 NOTE — Assessment & Plan Note (Addendum)
I had another in-depth discussion about the patient's alcoholism and cirrhosis with all its implications. The patient continues to reduce and hopefully will abstain from alcohol. In view of his portal hypertension I will start him on nadolol. He had been taking propranolol as needed for tremor . Recommendations #1 vaccinations for hepatitis A, B. and Pneumovax. Flu vaccination #2 begin nadolol 40 mg daily; DC propranolol #3 followup endoscopy April, 2014

## 2012-05-08 NOTE — Assessment & Plan Note (Signed)
Plan followup colonoscopy 

## 2012-05-08 NOTE — Progress Notes (Signed)
History of Present Illness:  The patient has returned for followup of cirrhosis. He has reduced his alcohol consumption significantly but admits to drinking 4 scotches a day. He is eating well and has regained strength. Weight is stable. He notices decreased abdominal girth. He had a myriad of questions about his alcoholism and cirrhosis.  Endoscopy in April, 2013 demonstrated portal hypertensive gastropathy.    Review of Systems: Pertinent positive and negative review of systems were noted in the above HPI section. All other review of systems were otherwise negative.    Current Medications, Allergies, Past Medical History, Past Surgical History, Family History and Social History were reviewed in [employer] electronic medical record  Vital signs were reviewed in today's medical record. Physical Exam: There are telangiectasias of his face General: Well developed , well nourished, no acute distress Head: Normocephalic and atraumatic Eyes:  sclerae anicteric, EOMI Ears: Normal auditory acuity Mouth: No deformity or lesions Lungs: Clear throughout to auscultation Heart: Regular rate and rhythm; no murmurs, rubs or bruits Abdomen: Soft, non tender and non distended. Liver is palpable 5-6 fingerbreadths below the right costal margin. Spleen is not palpable. There is no obvious ascites. Rectal:deferred Musculoskeletal: Symmetrical with no gross deformities  Pulses:  Normal pulses noted Extremities: No clubbing, cyanosis, edema or deformities noted Neurological: Alert oriented x 4, grossly nonfocal Psychological:  Alert and cooperative. Normal mood and affect

## 2012-05-15 ENCOUNTER — Ambulatory Visit (INDEPENDENT_AMBULATORY_CARE_PROVIDER_SITE_OTHER): Payer: 59 | Admitting: Gastroenterology

## 2012-05-15 DIAGNOSIS — Z23 Encounter for immunization: Secondary | ICD-10-CM

## 2012-05-15 DIAGNOSIS — K7030 Alcoholic cirrhosis of liver without ascites: Secondary | ICD-10-CM

## 2012-06-20 ENCOUNTER — Encounter: Payer: 59 | Admitting: Gastroenterology

## 2012-07-27 ENCOUNTER — Encounter: Payer: 59 | Admitting: Gastroenterology

## 2012-07-27 ENCOUNTER — Telehealth: Payer: Self-pay | Admitting: Gastroenterology

## 2012-07-27 NOTE — Telephone Encounter (Signed)
noted 

## 2012-07-31 ENCOUNTER — Encounter: Payer: Self-pay | Admitting: Gastroenterology

## 2012-10-03 ENCOUNTER — Other Ambulatory Visit: Payer: Self-pay | Admitting: Gastroenterology

## 2012-12-05 ENCOUNTER — Ambulatory Visit: Payer: 59 | Admitting: Family Medicine

## 2012-12-05 DIAGNOSIS — Z0289 Encounter for other administrative examinations: Secondary | ICD-10-CM

## 2013-01-10 ENCOUNTER — Other Ambulatory Visit: Payer: Self-pay | Admitting: Gastroenterology

## 2013-01-10 ENCOUNTER — Other Ambulatory Visit: Payer: Self-pay | Admitting: Family Medicine

## 2013-03-02 ENCOUNTER — Other Ambulatory Visit: Payer: Self-pay | Admitting: Family Medicine

## 2013-03-05 ENCOUNTER — Ambulatory Visit (INDEPENDENT_AMBULATORY_CARE_PROVIDER_SITE_OTHER): Payer: BC Managed Care – PPO | Admitting: Family Medicine

## 2013-03-05 ENCOUNTER — Encounter: Payer: Self-pay | Admitting: Family Medicine

## 2013-03-05 VITALS — BP 148/90 | HR 104 | Temp 97.9°F | Wt 256.0 lb

## 2013-03-05 DIAGNOSIS — M1990 Unspecified osteoarthritis, unspecified site: Secondary | ICD-10-CM

## 2013-03-05 DIAGNOSIS — M129 Arthropathy, unspecified: Secondary | ICD-10-CM

## 2013-03-05 LAB — CBC WITH DIFFERENTIAL/PLATELET
Basophils Relative: 0.5 % (ref 0.0–3.0)
Eosinophils Absolute: 0.2 10*3/uL (ref 0.0–0.7)
Eosinophils Relative: 2.4 % (ref 0.0–5.0)
HCT: 39.5 % (ref 39.0–52.0)
Hemoglobin: 13.4 g/dL (ref 13.0–17.0)
Lymphs Abs: 2.3 10*3/uL (ref 0.7–4.0)
MCHC: 33.8 g/dL (ref 30.0–36.0)
MCV: 95.6 fl (ref 78.0–100.0)
Monocytes Absolute: 0.8 10*3/uL (ref 0.1–1.0)
Neutro Abs: 4.1 10*3/uL (ref 1.4–7.7)
RBC: 4.13 Mil/uL — ABNORMAL LOW (ref 4.22–5.81)
WBC: 7.4 10*3/uL (ref 4.5–10.5)

## 2013-03-05 LAB — BASIC METABOLIC PANEL
BUN: 6 mg/dL (ref 6–23)
Chloride: 94 mEq/L — ABNORMAL LOW (ref 96–112)
Creatinine, Ser: 0.9 mg/dL (ref 0.4–1.5)
GFR: 97.84 mL/min (ref >60.00)

## 2013-03-05 LAB — HEPATIC FUNCTION PANEL
Bilirubin, Direct: 0.1 mg/dL (ref 0.0–0.3)
Total Bilirubin: 0.8 mg/dL (ref 0.3–1.2)

## 2013-03-05 MED ORDER — PREDNISONE 10 MG PO TABS: ORAL_TABLET | ORAL | Status: DC

## 2013-03-05 MED ORDER — HYDROCODONE-IBUPROFEN 7.5-200 MG PO TABS: 1.0000 | ORAL_TABLET | Freq: Four times a day (QID) | ORAL | Status: DC | PRN

## 2013-03-05 NOTE — Progress Notes (Signed)
  Subjective:    Patient ID: Austin Padilla, male    DOB: [birth year], 52 y.o.   MRN: [id number]  HPI Here for the sudden onset of pain in both knees and both ankles. No swelling or redness or warmth. No recent trauma. He has used Aleve and Tramadol with no relief, but Vicoprofen has helped. No hx of arthritis or gout.    Review of Systems  Constitutional: Negative.   Musculoskeletal: Positive for arthralgias and gait problem. Negative for back pain and joint swelling.       Objective:   Physical Exam  Constitutional: He appears well-developed and well-nourished.  Walks with a limp   Cardiovascular: Normal rate, regular rhythm, normal heart sounds and intact distal pulses.   Pulmonary/Chest: Effort normal and breath sounds normal.  Musculoskeletal:  His knees and ankles are normal on exam, no warmth or tenderness or edema          Assessment & Plan:  Joint pains of uncertain etiology, possibly gout. Get labs today including a uric acid level. Use Vicoprofen prn. Start on a 12 day prednisone taper beginning at 40 mg a day.

## 2013-03-07 ENCOUNTER — Telehealth: Payer: Self-pay | Admitting: Family Medicine

## 2013-03-07 MED ORDER — PANTOPRAZOLE SODIUM 40 MG PO TBEC: DELAYED_RELEASE_TABLET | ORAL | Status: DC

## 2013-03-07 MED ORDER — LOSARTAN POTASSIUM 50 MG PO TABS: ORAL_TABLET | ORAL | Status: DC

## 2013-03-07 MED ORDER — VENLAFAXINE HCL ER 150 MG PO CP24: ORAL_CAPSULE | ORAL | Status: DC

## 2013-03-07 NOTE — Telephone Encounter (Signed)
I spoke with pt and gave results.  

## 2013-03-07 NOTE — Progress Notes (Signed)
Quick Note:  I spoke with pt ______ 

## 2013-03-07 NOTE — Telephone Encounter (Signed)
Pt saw dr fry on mon and had labs. Would like results. thanks

## 2013-03-07 NOTE — Telephone Encounter (Signed)
Pt request refill of losartan 50 mg traMADol (ULTRAM) 50 MG tablet venlafaxine XR (EFFEXOR-XR) 150 MG 24 hr capsule pantoprazole (PROTONIX) 40 MG tablet  [facility] Pt saw Dr fry Monday and pt states he thinks that this appt will suffix for his fup appt on his meds. Pt had mentioned to dr fry he needed these meds and thought it had been done.

## 2013-03-19 ENCOUNTER — Telehealth: Payer: Self-pay | Admitting: Family Medicine

## 2013-03-19 NOTE — Telephone Encounter (Signed)
Pt needs appt to go over uric acid labs and pt needs refill on rx. Pt was given a 30 day and that 30 day is up. Pt can come in at the early am slot if need.

## 2013-03-20 NOTE — Telephone Encounter (Signed)
ok 

## 2013-03-20 NOTE — Telephone Encounter (Signed)
Please schedule at the first available appointment  

## 2013-03-20 NOTE — Telephone Encounter (Signed)
Done/ pt request prior to Dec 1.

## 2013-03-21 ENCOUNTER — Telehealth: Payer: Self-pay | Admitting: Family Medicine

## 2013-03-21 ENCOUNTER — Ambulatory Visit: Payer: BC Managed Care – PPO | Admitting: Family Medicine

## 2013-03-21 NOTE — Telephone Encounter (Signed)
Pt states he is sorry he had to miss his appt, but would like to know when he can get back in. pls advise

## 2013-03-22 NOTE — Telephone Encounter (Signed)
Fleet Contras please call on Friday,,,,,, any medication should be given by Dr. Kirtland Bouchard aplan because of his underlying liver disease

## 2013-03-28 NOTE — Telephone Encounter (Signed)
Left message on machine for patient to call Dr Arlyce Dice for refills

## 2013-07-04 ENCOUNTER — Other Ambulatory Visit: Payer: Self-pay | Admitting: Gastroenterology

## 2013-08-22 ENCOUNTER — Encounter (INDEPENDENT_AMBULATORY_CARE_PROVIDER_SITE_OTHER): Payer: BC Managed Care – PPO | Admitting: Surgery

## 2013-09-04 ENCOUNTER — Ambulatory Visit: Payer: BC Managed Care – PPO | Admitting: Family Medicine

## 2013-09-05 ENCOUNTER — Ambulatory Visit (INDEPENDENT_AMBULATORY_CARE_PROVIDER_SITE_OTHER): Payer: BC Managed Care – PPO | Admitting: Family Medicine

## 2013-09-05 ENCOUNTER — Encounter: Payer: Self-pay | Admitting: Family Medicine

## 2013-09-05 VITALS — BP 110/70 | Temp 98.9°F | Wt 248.0 lb

## 2013-09-05 DIAGNOSIS — N6320 Unspecified lump in the left breast, unspecified quadrant: Secondary | ICD-10-CM | POA: Insufficient documentation

## 2013-09-05 DIAGNOSIS — N63 Unspecified lump in unspecified breast: Secondary | ICD-10-CM

## 2013-09-05 DIAGNOSIS — R160 Hepatomegaly, not elsewhere classified: Secondary | ICD-10-CM

## 2013-09-05 DIAGNOSIS — K7030 Alcoholic cirrhosis of liver without ascites: Secondary | ICD-10-CM

## 2013-09-05 DIAGNOSIS — K612 Anorectal abscess: Secondary | ICD-10-CM

## 2013-09-05 MED ORDER — CEPHALEXIN 500 MG PO CAPS: ORAL_CAPSULE | ORAL | Status: DC

## 2013-09-05 NOTE — Progress Notes (Signed)
Pre visit review using our clinic review tool, if applicable. No additional management support is needed unless otherwise documented below in the visit note. 

## 2013-09-05 NOTE — Progress Notes (Signed)
   Subjective:    Patient ID: Austin Padilla, male    DOB: Jul 18, 1960, 53 y.o.   MRN: [id number]  HPI Austin Padilla is a 53 year old male who comes in today for evaluation of multiple issues  He is a history of alcoholism and liver disease however he still drinking 5 drinks daily. He's followed and GI  For the past 3 weeks he's had an abscess around his rectum. He's been treating at home with warm soaks. He's had this problem in the past  For the last month he said a lump in his left breast. He does not recall if it's gotten bigger not. No history of trauma   Review of Systems Review of systems otherwise negative    Objective:   Physical Exam Shaky male no acute distress examination of breath shows a golf ball sees lesion left breast is soft rubbery movable  He has a perirectal abscess quarter size with erythema of the buttocks       Assessment & Plan:  Left breast mass........... ultrasound rule out cancer  Perirectal abscess.............[name] begin warm soaks and antibiotics followup in GI  Alcoholism ........ again advise [facility] patient declines .....Marland KitchenMarland Kitchen

## 2013-09-05 NOTE — Patient Instructions (Signed)
Keflex 500 mg........... 2 tabs twice daily  Warm soaks twice daily  Call GI for followup  Ultrasound of your left breast  [facility]

## 2013-09-06 ENCOUNTER — Telehealth: Payer: Self-pay | Admitting: Family Medicine

## 2013-09-06 NOTE — Telephone Encounter (Signed)
Relevant patient education assigned to patient using Emmi. ° °

## 2013-11-28 ENCOUNTER — Encounter: Payer: Self-pay | Admitting: Gastroenterology

## 2013-12-25 ENCOUNTER — Ambulatory Visit: Payer: BC Managed Care – PPO | Admitting: Physician Assistant

## 2013-12-25 ENCOUNTER — Ambulatory Visit (INDEPENDENT_AMBULATORY_CARE_PROVIDER_SITE_OTHER): Payer: BC Managed Care – PPO | Admitting: Family Medicine

## 2013-12-25 ENCOUNTER — Encounter: Payer: Self-pay | Admitting: Family Medicine

## 2013-12-25 DIAGNOSIS — H6120 Impacted cerumen, unspecified ear: Secondary | ICD-10-CM

## 2013-12-25 DIAGNOSIS — H9319 Tinnitus, unspecified ear: Secondary | ICD-10-CM

## 2013-12-25 DIAGNOSIS — Z23 Encounter for immunization: Secondary | ICD-10-CM

## 2013-12-25 DIAGNOSIS — H9191 Unspecified hearing loss, right ear: Secondary | ICD-10-CM

## 2013-12-25 DIAGNOSIS — H9190 Unspecified hearing loss, unspecified ear: Secondary | ICD-10-CM

## 2013-12-25 DIAGNOSIS — H6123 Impacted cerumen, bilateral: Secondary | ICD-10-CM

## 2013-12-25 DIAGNOSIS — H9313 Tinnitus, bilateral: Secondary | ICD-10-CM

## 2013-12-25 NOTE — Progress Notes (Signed)
   Subjective:    Patient ID: Austin Padilla, male    DOB: November 18, 1960, 53 y.o.   MRN: [id number]  HPI  Austin Padilla is a 53 year old male who comes in today for evaluation of decreased hearing and tinnitus in his left ear  States over the weekend he slapped a bug in the nose and decreased hearing in that left ear along with some tenderness.  Initially exam so bilateral cerumen impactions were richly by her duration.  Review of Systems    review of systems otherwise negative Objective:   Physical Exam  Well-developed and nourished male no acute distress vital signs he was afebrile,,,,,,,,, ear exam shows bilateral cerumen impactions,,,,,,,,,, which relieved by irrigation,      Assessment & Plan:Hearing loss and question tinnitus secondary to ear wax..........[name] reassured  ENT consult when necessary  . Hearing loss secondary to wax,,,,,,,,,,,,,, which was removed with irrigation ....,,,,,,

## 2013-12-25 NOTE — Patient Instructions (Signed)
Weight for a week or 2 to see if your symptoms don't abate,,,,,,,,, if a dominant consult with ENT specialist Dr. Radene Journey,

## 2014-01-24 ENCOUNTER — Encounter: Payer: Self-pay | Admitting: Gastroenterology

## 2014-09-09 ENCOUNTER — Encounter: Payer: Self-pay | Admitting: Gastroenterology

## 2014-11-03 ENCOUNTER — Encounter (HOSPITAL_COMMUNITY): Payer: Self-pay | Admitting: Emergency Medicine

## 2014-11-03 ENCOUNTER — Inpatient Hospital Stay (HOSPITAL_COMMUNITY)
Admission: EM | Admit: 2014-11-03 | Discharge: 2014-11-18 | DRG: 682 | Disposition: A | Discharge status: Home Health | Payer: Self-pay | Attending: Internal Medicine | Admitting: Internal Medicine

## 2014-11-03 ENCOUNTER — Inpatient Hospital Stay (HOSPITAL_COMMUNITY): Payer: MEDICAID

## 2014-11-03 DIAGNOSIS — E872 Acidosis, unspecified: Secondary | ICD-10-CM | POA: Diagnosis present

## 2014-11-03 DIAGNOSIS — K219 Gastro-esophageal reflux disease without esophagitis: Secondary | ICD-10-CM | POA: Diagnosis present

## 2014-11-03 DIAGNOSIS — K766 Portal hypertension: Secondary | ICD-10-CM | POA: Diagnosis present

## 2014-11-03 DIAGNOSIS — K7011 Alcoholic hepatitis with ascites: Secondary | ICD-10-CM | POA: Diagnosis present

## 2014-11-03 DIAGNOSIS — N170 Acute kidney failure with tubular necrosis: Principal | ICD-10-CM | POA: Diagnosis present

## 2014-11-03 DIAGNOSIS — K3189 Other diseases of stomach and duodenum: Secondary | ICD-10-CM | POA: Diagnosis present

## 2014-11-03 DIAGNOSIS — Z452 Encounter for adjustment and management of vascular access device: Secondary | ICD-10-CM

## 2014-11-03 DIAGNOSIS — T465X5A Adverse effect of other antihypertensive drugs, initial encounter: Secondary | ICD-10-CM | POA: Diagnosis present

## 2014-11-03 DIAGNOSIS — E875 Hyperkalemia: Secondary | ICD-10-CM | POA: Diagnosis present

## 2014-11-03 DIAGNOSIS — Z888 Allergy status to other drugs, medicaments and biological substances status: Secondary | ICD-10-CM

## 2014-11-03 DIAGNOSIS — T39395A Adverse effect of other nonsteroidal anti-inflammatory drugs [NSAID], initial encounter: Secondary | ICD-10-CM | POA: Diagnosis present

## 2014-11-03 DIAGNOSIS — E039 Hypothyroidism, unspecified: Secondary | ICD-10-CM | POA: Diagnosis present

## 2014-11-03 DIAGNOSIS — E860 Dehydration: Secondary | ICD-10-CM | POA: Diagnosis present

## 2014-11-03 DIAGNOSIS — D638 Anemia in other chronic diseases classified elsewhere: Secondary | ICD-10-CM | POA: Diagnosis present

## 2014-11-03 DIAGNOSIS — A0472 Enterocolitis due to Clostridium difficile, not specified as recurrent: Secondary | ICD-10-CM | POA: Diagnosis present

## 2014-11-03 DIAGNOSIS — Z800 Family history of malignant neoplasm of digestive organs: Secondary | ICD-10-CM

## 2014-11-03 DIAGNOSIS — K648 Other hemorrhoids: Secondary | ICD-10-CM | POA: Diagnosis present

## 2014-11-03 DIAGNOSIS — F17210 Nicotine dependence, cigarettes, uncomplicated: Secondary | ICD-10-CM | POA: Diagnosis present

## 2014-11-03 DIAGNOSIS — F419 Anxiety disorder, unspecified: Secondary | ICD-10-CM | POA: Diagnosis present

## 2014-11-03 DIAGNOSIS — D62 Acute posthemorrhagic anemia: Secondary | ICD-10-CM | POA: Diagnosis present

## 2014-11-03 DIAGNOSIS — Z86010 Personal history of colonic polyps: Secondary | ICD-10-CM

## 2014-11-03 DIAGNOSIS — E669 Obesity, unspecified: Secondary | ICD-10-CM | POA: Diagnosis present

## 2014-11-03 DIAGNOSIS — K7030 Alcoholic cirrhosis of liver without ascites: Secondary | ICD-10-CM | POA: Diagnosis present

## 2014-11-03 DIAGNOSIS — D124 Benign neoplasm of descending colon: Secondary | ICD-10-CM | POA: Diagnosis present

## 2014-11-03 DIAGNOSIS — R579 Shock, unspecified: Secondary | ICD-10-CM | POA: Diagnosis present

## 2014-11-03 DIAGNOSIS — J9690 Respiratory failure, unspecified, unspecified whether with hypoxia or hypercapnia: Secondary | ICD-10-CM

## 2014-11-03 DIAGNOSIS — R739 Hyperglycemia, unspecified: Secondary | ICD-10-CM | POA: Diagnosis not present

## 2014-11-03 DIAGNOSIS — Z8249 Family history of ischemic heart disease and other diseases of the circulatory system: Secondary | ICD-10-CM

## 2014-11-03 DIAGNOSIS — E8809 Other disorders of plasma-protein metabolism, not elsewhere classified: Secondary | ICD-10-CM | POA: Diagnosis present

## 2014-11-03 DIAGNOSIS — D696 Thrombocytopenia, unspecified: Secondary | ICD-10-CM | POA: Diagnosis present

## 2014-11-03 DIAGNOSIS — E861 Hypovolemia: Secondary | ICD-10-CM | POA: Diagnosis present

## 2014-11-03 DIAGNOSIS — Z23 Encounter for immunization: Secondary | ICD-10-CM

## 2014-11-03 DIAGNOSIS — Z6835 Body mass index (BMI) 35.0-35.9, adult: Secondary | ICD-10-CM

## 2014-11-03 DIAGNOSIS — K567 Ileus, unspecified: Secondary | ICD-10-CM | POA: Diagnosis present

## 2014-11-03 DIAGNOSIS — R578 Other shock: Secondary | ICD-10-CM | POA: Diagnosis present

## 2014-11-03 DIAGNOSIS — K7460 Unspecified cirrhosis of liver: Secondary | ICD-10-CM

## 2014-11-03 DIAGNOSIS — A047 Enterocolitis due to Clostridium difficile: Secondary | ICD-10-CM | POA: Diagnosis present

## 2014-11-03 DIAGNOSIS — D684 Acquired coagulation factor deficiency: Secondary | ICD-10-CM | POA: Diagnosis present

## 2014-11-03 DIAGNOSIS — N179 Acute kidney failure, unspecified: Secondary | ICD-10-CM | POA: Diagnosis present

## 2014-11-03 DIAGNOSIS — E871 Hypo-osmolality and hyponatremia: Secondary | ICD-10-CM | POA: Diagnosis present

## 2014-11-03 DIAGNOSIS — K7031 Alcoholic cirrhosis of liver with ascites: Secondary | ICD-10-CM | POA: Diagnosis present

## 2014-11-03 DIAGNOSIS — I10 Essential (primary) hypertension: Secondary | ICD-10-CM | POA: Diagnosis present

## 2014-11-03 DIAGNOSIS — N19 Unspecified kidney failure: Secondary | ICD-10-CM

## 2014-11-03 DIAGNOSIS — F1020 Alcohol dependence, uncomplicated: Secondary | ICD-10-CM | POA: Diagnosis present

## 2014-11-03 DIAGNOSIS — K922 Gastrointestinal hemorrhage, unspecified: Secondary | ICD-10-CM | POA: Diagnosis present

## 2014-11-03 DIAGNOSIS — G250 Essential tremor: Secondary | ICD-10-CM | POA: Diagnosis present

## 2014-11-03 DIAGNOSIS — F329 Major depressive disorder, single episode, unspecified: Secondary | ICD-10-CM | POA: Diagnosis present

## 2014-11-03 DIAGNOSIS — R140 Abdominal distension (gaseous): Secondary | ICD-10-CM

## 2014-11-03 DIAGNOSIS — K635 Polyp of colon: Secondary | ICD-10-CM | POA: Diagnosis present

## 2014-11-03 LAB — I-STAT CHEM 8, ED
BUN: 89 mg/dL — ABNORMAL HIGH (ref 6–20)
BUN: 97 mg/dL — AB (ref 6–20)
CALCIUM ION: 0.8 mmol/L — AB (ref 1.12–1.23)
CHLORIDE: 89 mmol/L — AB (ref 101–111)
Calcium, Ion: 0.78 mmol/L — ABNORMAL LOW (ref 1.12–1.23)
Chloride: 93 mmol/L — ABNORMAL LOW (ref 101–111)
Creatinine, Ser: 10.7 mg/dL — ABNORMAL HIGH (ref 0.61–1.24)
Creatinine, Ser: 11.2 mg/dL — ABNORMAL HIGH (ref 0.61–1.24)
GLUCOSE: 101 mg/dL — AB (ref 65–99)
Glucose, Bld: 107 mg/dL — ABNORMAL HIGH (ref 65–99)
HCT: 21 % — ABNORMAL LOW (ref 39.0–52.0)
HEMATOCRIT: 23 % — AB (ref 39.0–52.0)
Hemoglobin: 7.1 g/dL — ABNORMAL LOW (ref 13.0–17.0)
Hemoglobin: 7.8 g/dL — ABNORMAL LOW (ref 13.0–17.0)
Potassium: 6.1 mmol/L (ref 3.5–5.1)
Potassium: 7 mmol/L (ref 3.5–5.1)
Sodium: 118 mmol/L — CL (ref 135–145)
Sodium: 122 mmol/L — ABNORMAL LOW (ref 135–145)
TCO2: 12 mmol/L (ref 0–100)
TCO2: 13 mmol/L (ref 0–100)

## 2014-11-03 LAB — I-STAT CG4 LACTIC ACID, ED
LACTIC ACID, VENOUS: 3.98 mmol/L — AB (ref 0.5–2.0)
Lactic Acid, Venous: 5.38 mmol/L (ref 0.5–2.0)

## 2014-11-03 LAB — BASIC METABOLIC PANEL
ANION GAP: 20 — AB (ref 5–15)
BUN: 88 mg/dL — ABNORMAL HIGH (ref 6–20)
CHLORIDE: 86 mmol/L — AB (ref 101–111)
CO2: 12 mmol/L — ABNORMAL LOW (ref 22–32)
CREATININE: 10.04 mg/dL — AB (ref 0.61–1.24)
Calcium: 6.2 mg/dL — CL (ref 8.9–10.3)
GFR calc Af Amer: 6 mL/min — ABNORMAL LOW (ref >60)
GFR, EST NON AFRICAN AMERICAN: 5 mL/min — AB (ref >60)
Glucose, Bld: 101 mg/dL — ABNORMAL HIGH (ref 65–99)
Potassium: 6.9 mmol/L (ref 3.5–5.1)
SODIUM: 118 mmol/L — AB (ref 135–145)

## 2014-11-03 LAB — HEPATIC FUNCTION PANEL
ALBUMIN: 2.8 g/dL — AB (ref 3.5–5.0)
ALT: 50 U/L (ref 17–63)
AST: 153 U/L — AB (ref 15–41)
Alkaline Phosphatase: 223 U/L — ABNORMAL HIGH (ref 38–126)
BILIRUBIN TOTAL: 3.5 mg/dL — AB (ref 0.3–1.2)
Bilirubin, Direct: 2.2 mg/dL — ABNORMAL HIGH (ref 0.1–0.5)
Indirect Bilirubin: 1.3 mg/dL — ABNORMAL HIGH (ref 0.3–0.9)
Total Protein: 6.2 g/dL — ABNORMAL LOW (ref 6.5–8.1)

## 2014-11-03 LAB — CBC
HCT: 22.7 % — ABNORMAL LOW (ref 39.0–52.0)
HEMATOCRIT: 18 % — AB (ref 39.0–52.0)
HEMOGLOBIN: 6.2 g/dL — AB (ref 13.0–17.0)
Hemoglobin: 7.7 g/dL — ABNORMAL LOW (ref 13.0–17.0)
MCH: 34.1 pg — ABNORMAL HIGH (ref 26.0–34.0)
MCH: 33.2 pg (ref 26.0–34.0)
MCHC: 33.9 g/dL (ref 30.0–36.0)
MCHC: 33.9 g/dL (ref 30.0–36.0)
MCV: 100.6 fL — ABNORMAL HIGH (ref 78.0–100.0)
MCV: 97.8 fL (ref 78.0–100.0)
PLATELETS: 139 10*3/uL — AB (ref 150–400)
Platelets: 123 10*3/uL — ABNORMAL LOW (ref 150–400)
RBC: 1.79 MIL/uL — AB (ref 4.22–5.81)
RBC: 2.32 MIL/uL — AB (ref 4.22–5.81)
RDW: 18.2 % — AB (ref 11.5–15.5)
RDW: 18.6 % — ABNORMAL HIGH (ref 11.5–15.5)
WBC: 8.2 10*3/uL (ref 4.0–10.5)
WBC: 7.5 10*3/uL (ref 4.0–10.5)

## 2014-11-03 LAB — URINALYSIS, ROUTINE W REFLEX MICROSCOPIC
GLUCOSE, UA: 100 mg/dL — AB
Ketones, ur: NEGATIVE mg/dL
Leukocytes, UA: NEGATIVE
NITRITE: NEGATIVE
PH: 7 (ref 5.0–8.0)
Protein, ur: >300 mg/dL — AB
SPECIFIC GRAVITY, URINE: 1.029 (ref 1.005–1.030)
Urobilinogen, UA: 1 mg/dL (ref 0.0–1.0)

## 2014-11-03 LAB — RAPID URINE DRUG SCREEN, HOSP PERFORMED
Amphetamines: NOT DETECTED
BENZODIAZEPINES: NOT DETECTED
Barbiturates: NOT DETECTED
Cocaine: NOT DETECTED
Opiates: NOT DETECTED
Tetrahydrocannabinol: NOT DETECTED

## 2014-11-03 LAB — I-STAT VENOUS BLOOD GAS, ED
ACID-BASE DEFICIT: 9 mmol/L — AB (ref 0.0–2.0)
Acid-base deficit: 13 mmol/L — ABNORMAL HIGH (ref 0.0–2.0)
BICARBONATE: 12.7 meq/L — AB (ref 20.0–24.0)
Bicarbonate: 16 mEq/L — ABNORMAL LOW (ref 20.0–24.0)
O2 Saturation: 46 %
O2 Saturation: 51 %
PH VEN: 7.3 (ref 7.250–7.300)
TCO2: 14 mmol/L (ref 0–100)
TCO2: 17 mmol/L (ref 0–100)
pCO2, Ven: 26.6 mmHg — ABNORMAL LOW (ref 45.0–50.0)
pCO2, Ven: 32.5 mmHg — ABNORMAL LOW (ref 45.0–50.0)
pH, Ven: 7.289 (ref 7.250–7.300)
pO2, Ven: 28 mmHg — CL (ref 30.0–45.0)
pO2, Ven: 30 mmHg (ref 30.0–45.0)

## 2014-11-03 LAB — URINE MICROSCOPIC-ADD ON

## 2014-11-03 LAB — PROTIME-INR
INR: 1.77 — ABNORMAL HIGH (ref 0.00–1.49)
Prothrombin Time: 20.6 seconds — ABNORMAL HIGH (ref 11.6–15.2)

## 2014-11-03 LAB — AMMONIA: AMMONIA: 23 umol/L (ref 9–35)

## 2014-11-03 LAB — POC OCCULT BLOOD, ED: Fecal Occult Bld: POSITIVE — AB

## 2014-11-03 LAB — ETHANOL: Alcohol, Ethyl (B): <5 mg/dL (ref <5)

## 2014-11-03 LAB — PREPARE RBC (CROSSMATCH)

## 2014-11-03 LAB — ABO/RH: ABO/RH(D): B POS

## 2014-11-03 MED ORDER — ALBUTEROL SULFATE (2.5 MG/3ML) 0.083% IN NEBU: 10.0000 mg | INHALATION_SOLUTION | Freq: Once | RESPIRATORY_TRACT | Status: DC

## 2014-11-03 MED ORDER — INSULIN ASPART 100 UNIT/ML IV SOLN
10.0000 [IU] | Freq: Once | INTRAVENOUS | Status: AC
  Administered 2014-11-03: 10 [IU] via INTRAVENOUS
  Filled 2014-11-03: qty 1

## 2014-11-03 MED ORDER — SODIUM BICARBONATE 8.4 % IV SOLN
INTRAVENOUS | Status: DC
  Filled 2014-11-03 (×2): qty 150

## 2014-11-03 MED ORDER — SODIUM CHLORIDE 0.9 % IV BOLUS (SEPSIS)
1000.0000 mL | Freq: Once | INTRAVENOUS | Status: AC
  Administered 2014-11-03: 1000 mL via INTRAVENOUS

## 2014-11-03 MED ORDER — PRISMASOL BGK 0/2.5 32-2.5 MEQ/L IV SOLN
INTRAVENOUS | Status: DC
  Administered 2014-11-04: 01:00:00 via INTRAVENOUS_CENTRAL
  Filled 2014-11-03 (×3): qty 5000

## 2014-11-03 MED ORDER — STERILE WATER FOR INJECTION IV SOLN
INTRAVENOUS | Status: DC
  Administered 2014-11-04: via INTRAVENOUS
  Filled 2014-11-03 (×2): qty 850

## 2014-11-03 MED ORDER — SODIUM CHLORIDE 0.9 % IV SOLN: Freq: Once | INTRAVENOUS | Status: DC

## 2014-11-03 MED ORDER — DEXTROSE 5 % IV SOLN
30.0000 ug/min | INTRAVENOUS | Status: DC
  Administered 2014-11-03: 30 ug/min via INTRAVENOUS
  Filled 2014-11-03: qty 1

## 2014-11-03 MED ORDER — PANTOPRAZOLE SODIUM 40 MG IV SOLR
40.0000 mg | Freq: Once | INTRAVENOUS | Status: AC
  Administered 2014-11-03: 40 mg via INTRAVENOUS
  Filled 2014-11-03: qty 40

## 2014-11-03 MED ORDER — HEPARIN SODIUM (PORCINE) 1000 UNIT/ML DIALYSIS
1000.0000 [IU] | INTRAMUSCULAR | Status: DC | PRN
  Filled 2014-11-03 (×2): qty 6

## 2014-11-03 MED ORDER — CALCIUM GLUCONATE 10 % IV SOLN
1.0000 g | Freq: Once | INTRAVENOUS | Status: AC
  Administered 2014-11-03: 1 g via INTRAVENOUS
  Filled 2014-11-03: qty 10

## 2014-11-03 MED ORDER — VITAMIN K1 10 MG/ML IJ SOLN
10.0000 mg | Freq: Once | INTRAVENOUS | Status: DC
  Filled 2014-11-03: qty 1

## 2014-11-03 MED ORDER — SODIUM CHLORIDE 0.9 % IV SOLN
1.0000 g | Freq: Once | INTRAVENOUS | Status: AC
  Administered 2014-11-03: 1 g via INTRAVENOUS
  Filled 2014-11-03: qty 10

## 2014-11-03 MED ORDER — SODIUM CHLORIDE 0.9 % IV BOLUS (SEPSIS)
3000.0000 mL | Freq: Once | INTRAVENOUS | Status: AC
  Administered 2014-11-04: 1000 mL via INTRAVENOUS

## 2014-11-03 MED ORDER — SODIUM CHLORIDE 0.9 % IV SOLN: 10.0000 mL/h | Freq: Once | INTRAVENOUS | Status: DC

## 2014-11-03 MED ORDER — ALBUTEROL (5 MG/ML) CONTINUOUS INHALATION SOLN
7.5000 mg/h | INHALATION_SOLUTION | Freq: Once | RESPIRATORY_TRACT | Status: AC
  Administered 2014-11-03: 7.5 mg/h via RESPIRATORY_TRACT
  Filled 2014-11-03: qty 20

## 2014-11-03 MED ORDER — PRISMASOL BGK 0/2.5 32-2.5 MEQ/L IV SOLN
INTRAVENOUS | Status: DC
  Administered 2014-11-04: 01:00:00 via INTRAVENOUS_CENTRAL
  Filled 2014-11-03 (×4): qty 5000

## 2014-11-03 MED ORDER — PRISMASOL BGK 0/2.5 32-2.5 MEQ/L IV SOLN
INTRAVENOUS | Status: DC
  Administered 2014-11-04 (×5): via INTRAVENOUS_CENTRAL
  Filled 2014-11-03 (×15): qty 5000

## 2014-11-03 MED ORDER — DEXTROSE 50 % IV SOLN
1.0000 | Freq: Once | INTRAVENOUS | Status: AC
  Administered 2014-11-03: 50 mL via INTRAVENOUS
  Filled 2014-11-03: qty 50

## 2014-11-03 MED ORDER — ALTEPLASE 2 MG IJ SOLR
2.0000 mg | Freq: Once | INTRAMUSCULAR | Status: AC | PRN
  Filled 2014-11-03: qty 2

## 2014-11-03 MED ORDER — SODIUM CHLORIDE 0.9 % FOR CRRT
INTRAVENOUS_CENTRAL | Status: DC | PRN
  Filled 2014-11-03: qty 1000

## 2014-11-03 MED ORDER — SODIUM CHLORIDE 0.9 % IV SOLN: 250.0000 mL | INTRAVENOUS | Status: DC | PRN

## 2014-11-03 MED ORDER — SODIUM BICARBONATE 8.4 % IV SOLN
100.0000 meq | INTRAVENOUS | Status: AC
  Administered 2014-11-03: 100 meq via INTRAVENOUS
  Filled 2014-11-03: qty 100

## 2014-11-03 NOTE — Consult Note (Addendum)
Renal Service Consult Note [facility]  Austin Padilla 11/03/2014 Austin Padilla Requesting Physician: Dr. Audie Pinto   Reason for Consult:  Acute renal failure, GIB, etoh abuse HPI: The patient is a 54 y.o. year-old with hx of anxiety, depression, HTN and etoh abuse was found laying on the floor of his home. Diarrhea x 3 days. Bloody stools noted in his bathroom. Brought to ED where BP 60's, HR 40's, K 6.9, Na 118 and creat 10.4.  Asked to see for dialysis.   Pt vague historian . Denies hx of kidney failure, last creat from 11/14 was 0.9. Has been taking some ibuprofen and is on losartan and Corgard for HTN. Also takes a PPI, Effexor, Keflex.   Past Medical History  Past Medical History  Diagnosis Date  . ANXIETY 04/25/2008    Qualifier: Diagnosis of  By: Sherren Mocha MD, Jory Ee   . DEPRESSION 04/25/2008    Qualifier: Diagnosis of  By: Sherren Mocha MD, Jory Ee   . HYPERTENSION 04/25/2008    Qualifier: Diagnosis of  By: Sherren Mocha MD, Jory Ee   . GERD 04/25/2008    Qualifier: Diagnosis of  By: Sherren Mocha MD, Jory Ee   . DIVERTICULOSIS, COLON 06/13/2009    Qualifier: Diagnosis of  By: Sherren Mocha MD, [place], PERIRECTAL 08/05/2008    Qualifier: Diagnosis of  By: Sherlynn Stalls, CMA, [place]    . ABDOMINAL PAIN, ACUTE 05/23/2009    Qualifier: Diagnosis of  By: Sherren Mocha MD, Jory Ee   . Alcoholism   . Essential tremor    Past Surgical History  Past Surgical History  Procedure Laterality Date  . Perianal abcess    . Tonsillectomy and adenoidectomy    . Esophagogastroduodenoscopy  08/27/2011    Procedure: ESOPHAGOGASTRODUODENOSCOPY (EGD);  Surgeon: Inda Castle, MD;  Location: [facility];  Service: Endoscopy;  Laterality: N/A;  See lab draw orders for pre procedure   Family History  Family History  Problem Relation Age of Onset  . Stomach cancer Maternal Grandfather   . Hypertension Maternal Uncle     great uncle   Social History  reports that he has been smoking Cigarettes.  He has  been smoking about 0.50 packs per day. He has never used smokeless tobacco. He reports that he drinks alcohol. He reports that he does not use illicit drugs. Allergies  Allergies  Allergen Reactions  . Metoclopramide Hcl     REACTION: unspecified   Home medications Prior to Admission medications   Medication Sig Start Date End Date Taking? Authorizing Provider  cephALEXin (KEFLEX) 500 MG capsule 2 tabs twice a day 09/05/13   Dorena Cookey, MD  HYDROcodone-ibuprofen (VICOPROFEN) 7.5-200 MG per tablet Take 1 tablet by mouth every 6 (six) hours as needed for pain. 03/05/13   Laurey Morale, MD  losartan (COZAAR) 50 MG tablet TAKE 1 TABLET DAILY. 03/07/13   Dorena Cookey, MD  Multiple Vitamins-Minerals (CENTRUM PO) Take 1 tablet by mouth daily.    Historical Provider, MD  nadolol (CORGARD) 40 MG tablet TAKE 1 TABLET ONCE DAILY. 07/04/13   Inda Castle, MD  pantoprazole (PROTONIX) 40 MG tablet TAKE 1 TABLET DAILY. 03/07/13   Dorena Cookey, MD  venlafaxine XR (EFFEXOR-XR) 150 MG 24 hr capsule TAKE (1) CAPSULE TWICE DAILY. 03/07/13   Dorena Cookey, MD   Liver Function Tests  Recent Labs Lab 11/03/14 2050  AST 153*  ALT 50  ALKPHOS 223*  BILITOT 3.5*  PROT 6.2*  ALBUMIN 2.8*   No results for input(s): LIPASE, AMYLASE in the last 168 hours. CBC  Recent Labs Lab 11/03/14 2000 11/03/14 2020  WBC 8.2  --   HGB 6.2* 7.1*  HCT 18.0* 21.0*  MCV 100.6*  --   PLT 139*  --    Basic Metabolic Panel  Recent Labs Lab 11/03/14 2020 11/03/14 2050  NA 118* 118*  K 7.0* 6.9*  CL 89* 86*  CO2  --  12*  GLUCOSE 101* 101*  BUN 97* 88*  CREATININE 11.20* 10.04*  CALCIUM  --  6.2*    Filed Vitals:   11/03/14 2145 11/03/14 2150 11/03/14 2155 11/03/14 2200  BP: 90/39 82/41 79/40  105/29  Pulse: 57 57 55 54  Temp: 95 F (35 C) 95.2 F (35.1 C) 95.4 F (35.2 C) 95.5 F (35.3 C)  TempSrc:      Resp: 18 19 16 15   SpO2: 100% 100% 99% 98%   Exam Pt is alert, conversant, a bit  groggy Probable mild scleral icterus No rash, cyanosis or gangrene Throat is dry No jvd Chest is clear bilat RRR no MRG, brady Abd mod obese, large firm enlarged liver 10 cm below RCM GU foley in place No LE or UE edema, bruising L shin Neuro is alert, groggy, nonfocal  Na 118, K 6.9, CO2 12, BUN 88, Creat 10.04, Ca 6.2 iCa 0.80, alb 2.8, AST 153, ALT 50, NH3 23, Tbili 3.5 WBC 8k  Hb 6.2  plt 139  INR 1.77 UA >300 prot, 11-20 rbc's, 1.029, 7-10 wbc's EKG sinus brady, widened QRS 148 msec CXR not done  Assessment: 1. Renal failure - suspect acute KI in setting of dehydration/ diarrhea/ ARB/ NSAID's.  2. Hyperkalemia - widened QRS on presentation, has rec'd IV meds in ED to stabilize 3. Hyponatremia - hypovolemic, treat with NS and CRRT 4. Metabolic acidosis from renal failure/ shock, AG = 20 5. Etoh abuse / hepatomegaly - alcoholic cirrhosis and/or hepatitis possibly 6. GI bleed - HB 6.2, getting prbc's 7. Anemia as above 8. Vol depletion 9. Shock - is going to be started on pressors per CCM   Plan - NaHCO3 drip at 75/ hr, orders written for CRRT. Will keep +75/ hr w CRRT due to vol depletion. There is zero K in the replacement fluids and 2 mEq/L K+ in the dialysate for now, may need adjustment tomorrow if K is down.   Kelly Splinter MD (pgr) [phone]    ([phone] 11/03/2014, 10:18 PM   '

## 2014-11-03 NOTE — ED Notes (Signed)
Patient here from home. EMS reports that home was extremely littered; 1 years worth of mail on floor; 100s of liquor bottles on floor of home. Was found laying on floor of home. Unknown how long he was there. Initial SBP was 50 palpated; 54 HR. Reported Diarrhea x3 days. Bright red bloody stools noted in bathroom.

## 2014-11-03 NOTE — H&P (Addendum)
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 Has had 3d of bloody diarrhea and has been consuming etoh +/- NSAID. Family found him weak and in stool 11/03/2014 . EMS/ER fouind him hypotensive with sbp 50s and with HR 40s and hypothermic 94.71F. Labs revealed jaundice with mild INR elevation but normal platelet but profound acidosis and ATN and hyperkalemia (gap 22, lactate 5, K 7, Bic 12) and anemic Hgb < 7gm% and in circulatory shock despite 2 Unit PRBC (sbp 80s). He is not intubated and is oriented and answering questions. PCCM admitting 11/03/2014. He receive K correcting Rx of albuterol, calcium and insulin/dextrose in ER    PAST MEDICAL HISTORY :   has a past medical history of ANXIETY (04/25/2008); DEPRESSION (04/25/2008); HYPERTENSION (04/25/2008); GERD (04/25/2008); DIVERTICULOSIS, COLON (06/13/2009); ABSCESS, PERIRECTAL (08/05/2008); ABDOMINAL PAIN, ACUTE (05/23/2009); Alcoholism; and Essential tremor.  has past surgical history that includes perianal abcess; Tonsillectomy and adenoidectomy; and Esophagogastroduodenoscopy (08/27/2011). Prior to Admission medications   Medication Sig Start Date End Date Taking? Authorizing Provider  cephALEXin (KEFLEX) 500 MG capsule 2 tabs twice a day 09/05/13   Dorena Cookey, MD  HYDROcodone-ibuprofen (VICOPROFEN) 7.5-200 MG per tablet Take 1 tablet by mouth every 6 (six) hours as needed for pain. 03/05/13   Laurey Morale, MD  losartan (COZAAR) 50 MG tablet TAKE 1 TABLET DAILY. 03/07/13   Dorena Cookey, MD  Multiple  Vitamins-Minerals (CENTRUM PO) Take 1 tablet by mouth daily.    Historical Provider, MD  nadolol (CORGARD) 40 MG tablet TAKE 1 TABLET ONCE DAILY. 07/04/13   Inda Castle, MD  pantoprazole (PROTONIX) 40 MG tablet TAKE 1 TABLET DAILY. 03/07/13   Dorena Cookey, MD  venlafaxine XR (EFFEXOR-XR) 150 MG 24 hr capsule TAKE (1) CAPSULE TWICE DAILY. 03/07/13   Dorena Cookey, MD   Allergies  Allergen Reactions  . Metoclopramide Hcl     REACTION: unspecified    FAMILY HISTORY:  has no family status information on file.  SOCIAL HISTORY:  reports that he has been smoking Cigarettes.  He has been smoking about 0.50 packs per day. He has never used smokeless tobacco. He reports that he drinks alcohol. He reports that he does not use illicit drugs.    VITAL SIGNS: Temp:  [94.1 F (34.5 C)-95.5 F (35.3 C)] 95.5 F (35.3 C) (07/03 2200) Pulse Rate:  [50-57] 54 (07/03 2200) Resp:  [14-38] 15 (07/03 2200) BP: (52-105)/(25-56) 105/29 mmHg (07/03 2200) SpO2:  [94 %-100 %] 98 % (07/03 2200) HEMODYNAMICS:   VENTILATOR SETTINGS:   INTAKE / OUTPUT: No intake or output data in the 24 hours ending 11/03/14 2235  PHYSICAL EXAMINATION: General:  Obese, disheveled male lyuing in ER Bed. No distress. Deconditioned looking. LOOKS DRY Neuro:  MILD ASTERIXIS  +. Oriented x 3. Moves all 4s. CAlm HEENT:  Bearded. Mallampatti class 3 Cardiovascular:  HR 60s, - 70s, SBP 70s,. Normal heart sounds Lungs:  CTA bilaterally. No distress Abdomen:  OBese, Soft .  Non tender Musculoskeletal:  Sccattered spider nevi + on chest. Otherwise no clubbing, cyanosis, pedal edema Skin:  JAUNDICED +  LABS: PULMONARY  Recent Labs Lab 11/03/14 2009 11/03/14 2020  HCO3 12.7*  --   TCO2 14 12  O2SAT 46.0  --     CBC  Recent Labs Lab 11/03/14 2000 11/03/14 2020  HGB 6.2* 7.1*  HCT 18.0* 21.0*  WBC 8.2  --   PLT 139*  --     COAGULATION  Recent Labs Lab 11/03/14 2000  INR 1.77*    CARDIAC  No results  for input(s): TROPONINI in the last 168 hours. No results for input(s): PROBNP in the last 168 hours.   CHEMISTRY  Recent Labs Lab 11/03/14 2020 11/03/14 2050  NA 118* 118*  K 7.0* 6.9*  CL 89* 86*  CO2  --  12*  GLUCOSE 101* 101*  BUN 97* 88*  CREATININE 11.20* 10.04*  CALCIUM  --  6.2*   CrCl cannot be calculated (Unknown ideal weight.).   LIVER  Recent Labs Lab 11/03/14 2000 11/03/14 2050  AST  --  153*  ALT  --  50  ALKPHOS  --  223*  BILITOT  --  3.5*  PROT  --  6.2*  ALBUMIN  --  2.8*  INR 1.77*  --      INFECTIOUS  Recent Labs Lab 11/03/14 2009  LATICACIDVEN 5.38*     ENDOCRINE CBG (last 3)  No results for input(s): GLUCAP in the last 72 hours.       IMAGING x48h  - image(s) personally visualized  -   highlighted in bold No results found.       ASSESSMENT / PLAN:  PULMONARY OET x A: Nil acute but at risk for intubation with severe acidosis P:   ABG Moinitor   CARDIOVASCULAR CVL - planned A: Circulatory shock 0- likely combo of dehdyration, hemorrhage and possibly/likely septic P:  Fluid bolus Blood products Neo via PIV and later levophed via CVL MAG goal > 65  RENAL A:  Severe ATN with acidosis, and  Hyperkalemia  - making urine in ER - looks clear - likely volume loss related P:   Bicarb bolus and bic gtt STAT Place HD Cath Renal US -  Per REnal Dr Jonnie Finner Maintain bp/hr  GASTROINTESTINAL A:  #Baseline  - hx of rectal abscess 2015 and alcholism with hx of enlarged liver #Current  - GI bleed  - Diarreha - AT risk cirrhotic  - Jaundiced ? cause  P:   Check tylenol and salicylate level Stool PCR  PPI RUQ [place] - rule out cirrhosis Hepatitis Virus panel - needs to be ordered Might need GI consult  HEMATOLOGIC A:  Blood loss anemia INR 1.77 P:  Transfuse for hemodynamics and Hgb goal > 7gm% 1 unit FFP if needdd Vit K 10mg  Sq now  INFECTIOUS A:  Possible septic P:   Pan culture Broad abx - unasyn -  source unknown  ENDOCRINE A:  AT risk hyperglycemia P:   Check TSH SSI  NEUROLOGIC A:  AT risk delirium P:   Check ammonia Monitor If needed give lactulose  FAMILY  - Updates: patient updated in ER. D/w Dr Audie Pinto EDP, Tori Milks ER resident and Dr Jonnie Finner renal all at [place] family meet or Palliative Care meeting due by:  11/10/14   TODAY'S SUMMARY: severe renal failure - likely multufactorial - ? Can we give fluids and avoid CVVH.  Address possible GI  bleed and cirrhosis. Full code      The patient is critically ill with multiple organ systems failure and requires high complexity decision making for assessment and support, frequent evaluation and titration of therapies, application of advanced monitoring technologies and extensive interpretation of multiple databases.   Critical Care Time devoted to patient care services described in this note is  60  Minutes. This time reflects time of care of this signee Dr Brand Males. This critical care time does not reflect procedure time, or teaching time or supervisory time of PA/NP/Med student/Med Resident etc but could involve care discussion time    Dr. Brand Males, M.D., [facility].C.P Pulmonary and Critical Care Medicine Staff Physician [place] [facility] Pager: [phone], If no answer or between  15:00h - 7:00h: call [phone]  11/03/2014 10:50 PM

## 2014-11-03 NOTE — ED Provider Notes (Signed)
CSN: [id number]     Arrival date & time 11/03/14  1954 History   First MD Initiated Contact with Patient 11/03/14 2013     Chief Complaint  Patient presents with  . GI Bleeding   (Consider location/radiation/quality/duration/timing/severity/associated sxs/prior Treatment) Patient is a 54 y.o. male presenting with hematochezia. The history is provided by the EMS personnel, a parent and the patient. The history is limited by the condition of the patient.  Rectal Bleeding Quality:  Bright red and mucoid Amount:  Moderate Timing:  Intermittent Progression:  Waxing and waning Chronicity:  Recurrent Context: defecation, hemorrhoids and spontaneously   Similar prior episodes: no   Relieved by:  Nothing Worsened by:  Nothing tried Ineffective treatments:  None tried Associated symptoms: no recent illness   Risk factors: liver disease and NSAID use   Risk factors: no anticoagulant use   Risk factors comment:  EtOH abuse   Past Medical History  Diagnosis Date  . ANXIETY 04/25/2008    Qualifier: Diagnosis of  By: Sherren Mocha MD, Jory Ee   . DEPRESSION 04/25/2008    Qualifier: Diagnosis of  By: Sherren Mocha MD, Jory Ee   . HYPERTENSION 04/25/2008    Qualifier: Diagnosis of  By: Sherren Mocha MD, Jory Ee   . GERD 04/25/2008    Qualifier: Diagnosis of  By: Sherren Mocha MD, Jory Ee   . DIVERTICULOSIS, COLON 06/13/2009    Qualifier: Diagnosis of  By: Sherren Mocha MD, [place], PERIRECTAL 08/05/2008    Qualifier: Diagnosis of  By: Sherlynn Stalls, CMA, [place]    . ABDOMINAL PAIN, ACUTE 05/23/2009    Qualifier: Diagnosis of  By: Sherren Mocha MD, Jory Ee   . Alcoholism   . Essential tremor    Past Surgical History  Procedure Laterality Date  . Perianal abcess    . Tonsillectomy and adenoidectomy    . Esophagogastroduodenoscopy  08/27/2011    Procedure: ESOPHAGOGASTRODUODENOSCOPY (EGD);  Surgeon: Inda Castle, MD;  Location: [facility];  Service: Endoscopy;  Laterality: N/A;  See lab draw orders for pre procedure    Family History  Problem Relation Age of Onset  . Stomach cancer Maternal Grandfather   . Hypertension Maternal Uncle     great uncle   History  Substance Use Topics  . Smoking status: Current Every Day Smoker -- 0.50 packs/day    Types: Cigarettes  . Smokeless tobacco: Never Used  . Alcohol Use: Yes     Comment: 4 per day    Review of Systems  Unable to perform ROS: Mental status change  Constitutional: Positive for fatigue.  Gastrointestinal: Positive for diarrhea, blood in stool, hematochezia and anal bleeding.  Skin: Positive for pallor.  Neurological: Positive for weakness (generalized fatigue).  Psychiatric/Behavioral: Positive for confusion.      Allergies  Metoclopramide hcl  Home Medications   Prior to Admission medications   Medication Sig Start Date End Date Taking? Authorizing Provider  cephALEXin (KEFLEX) 500 MG capsule 2 tabs twice a day 09/05/13   Dorena Cookey, MD  HYDROcodone-ibuprofen (VICOPROFEN) 7.5-200 MG per tablet Take 1 tablet by mouth every 6 (six) hours as needed for pain. 03/05/13   Laurey Morale, MD  losartan (COZAAR) 50 MG tablet TAKE 1 TABLET DAILY. 03/07/13   Dorena Cookey, MD  Multiple Vitamins-Minerals (CENTRUM PO) Take 1 tablet by mouth daily.    Historical Provider, MD  nadolol (CORGARD) 40 MG tablet TAKE 1 TABLET ONCE DAILY. 07/04/13   Inda Castle, MD  pantoprazole ([place])  40 MG tablet TAKE 1 TABLET DAILY. 03/07/13   Dorena Cookey, MD  venlafaxine XR (EFFEXOR-XR) 150 MG 24 hr capsule TAKE (1) CAPSULE TWICE DAILY. 03/07/13   Dorena Cookey, MD   BP 78/46 mmHg  Pulse 50  Temp(Src) 94.1 F (34.5 C) (Rectal)  Resp 18  SpO2 100%   Filed Vitals:   11/03/14 2001 11/03/14 2005 11/03/14 2015 11/03/14 2016  BP: 55/45 66/43 78/46    Pulse: 54 54 50   Temp:    94.1 F (34.5 C)  TempSrc:    Rectal  Resp: 22 16 18    SpO2: 100% 100% 100%     Physical Exam  Constitutional: He appears well-developed and well-nourished. He appears  lethargic. He is cooperative. He appears toxic. He appears distressed.  HENT:  Head: Normocephalic and atraumatic.  Nose: Nose normal.  Mouth/Throat: Oropharynx is clear and moist. No oropharyngeal exudate.  Eyes: EOM are normal. Pupils are equal, round, and reactive to light.  Neck: Normal range of motion. Neck supple.  Cardiovascular: Regular rhythm, normal heart sounds and intact distal pulses.  Bradycardia present.  Exam reveals decreased pulses.   No murmur heard. Thready distal pulses bilaterally, but symmetric   Pulmonary/Chest: Effort normal and breath sounds normal. No respiratory distress. He has no wheezes. He exhibits no tenderness.  O2 by [place], clear lung sounds  Abdominal: Soft. He exhibits no distension. There is no tenderness. There is no guarding.  Genitourinary:  Loose red/orangish stool over lower extremities and buttocks.  + frank bloody stool and + heme occult.  No large rectal fissures, fistula, or any gross active bleeding noted.  Normal rectal tone  Musculoskeletal: Normal range of motion. He exhibits no tenderness.  Neurological: He appears lethargic. No cranial nerve deficit. Coordination normal.  Oriented x 3 but sluggish responses and limited response to complex questioning.  Moving all extremities, following commands, no gross neuro deficits other than somnolence   Skin: Skin is dry. He is not diaphoretic. There is pallor.  Cool extremities but not cold and all symmetric  Nursing note and vitals reviewed.   ED Course  Procedures (including critical care time) Labs Review Labs Reviewed  CBC - Abnormal; Notable for the following:    RBC 1.79 (*)    Hemoglobin 6.2 (*)    HCT 18.0 (*)    MCV 100.6 (*)    MCH 34.1 (*)    RDW 18.2 (*)    Platelets 139 (*)    All other components within normal limits  I-STAT VENOUS BLOOD GAS, ED - Abnormal; Notable for the following:    pCO2, Ven 26.6 (*)    pO2, Ven 28.0 (*)    Bicarbonate 12.7 (*)    Acid-base deficit  13.0 (*)    All other components within normal limits  I-STAT CG4 LACTIC ACID, ED - Abnormal; Notable for the following:    Lactic Acid, Venous 5.38 (*)    All other components within normal limits  POC OCCULT BLOOD, ED - Abnormal; Notable for the following:    Fecal Occult Bld POSITIVE (*)    All other components within normal limits  I-STAT CHEM 8, ED - Abnormal; Notable for the following:    Sodium 118 (*)    Potassium 7.0 (*)    Chloride 89 (*)    BUN 97 (*)    Creatinine, Ser 11.20 (*)    Glucose, Bld 101 (*)    Calcium, Ion 0.80 (*)    Hemoglobin 7.1 (*)  HCT 21.0 (*)    All other components within normal limits  PROTIME-INR  AMMONIA  ETHANOL  URINE RAPID DRUG SCREEN, HOSP PERFORMED  URINALYSIS, ROUTINE W REFLEX MICROSCOPIC (NOT AT [facility])  HEPATIC FUNCTION PANEL  BASIC METABOLIC PANEL  TYPE AND SCREEN  PREPARE RBC (CROSSMATCH)   Imaging Review No results found.   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 who presented in acute distress 2/2 GI bleed.  Family found him on his living room floor with stool all over him this afternoon.  Family spoke to him this AM and he "seemed tired" but was oriented.  They drove in town to check on him and found him confused, pale, and with his house was a total mess with tons of liquor bottles everywhere.  Called 911 immediately.  Pt's BP was 50 over palpation with EMS.  He received 1.5 L NS by EMS prior to arrival.  Was cool to touch so they warmed the IVF and put him under blankets.  Glucose wnl.  Additional 1L NS given on arrival to the ED.  Mentating slowly but able to answer most basic questions.  Initial ED BP 55/45.  Bradycardic in 40s-50s.  Hypothermic to 94.1  rectally, so started on bear hugger.   Stool on lower extremities/buttocks that was red/orange and + heme occult.  No continued bleeding and no obvious external source.  He reports several days of loose bloody stool then became incontinent and had spontaneous bleeding per rectum for the past two days (but pt is unsure on timeline and changed his story several times, doubt reliability here). Endorses a hx of EtOH abuse, rectal fistula, and rectal AVM.  Seen by GI but has not had any procedures done in our records.  Also reports that he has taken NSAIDs several times this week.  No melena seen at this time, so likely lower GI bleed.    Was persistently hypotensive, so 2 units emergency release pRBC ordered.  Patient gave verbal agreement to the risks/benefits of blood transfusion, and this was reinforced by consent from his parents on their arrival to the ED.    istat labs with Hb 6.2, Na 118, K 7, BUN 87, and Cr 11.2.   EKG with no peaked T waves but due to hyperkalemia and bradycardia, he was given cardioprotection with calcium chloride, albuterol, and insulin/glucose.  Unfortunately, since he has received 2 units of pRBCs, his K will likely continue to rise.   Covered with additional dose of calcium after ~45 minutes.   Labs show Hb 7.1, plts 139, INR 1.7.  Na 118, K 6.9, CO2 12, BUN 88, Cr 10, glucose 101, AG 12.  Lactate 5.  VBG: 7.289/26/28/12/+13  He has been persistently hypotensive around 80s/50 since receiving the pRBCs and IVF.  Still bradycardic in the 60s, but this is improved by ~ 10 bpm since arrival.    Sx likely 2/2 to both GI bleed and dehydration.  Unknown duration of his bloody stools, but does not seem to have significant amount dumping out since being in the ED and has not had any melena.  No hx of kidney failure in the past but now significantly deranged. Unclear timing of his  AKI.  Elevated ammonia and LFTs, but has known liver pathology and EtOH abuse.  INR and platelets ok.     Called critical care, who accepted patient to the ICU team.  Will come when able but are currently at OSH.    Paged nephrology about emergent dialysis due to hyperK, AKI, and bradycardia.  Dr. [name] presented to ED bedside to evaluate and suggested CRRT in ICU.    Dr. Chase Caller placed a Right IJ CVC in the ED with local lidocaine and US guidance.  Verified placement by blood gas c/w venous sample.  Pt tolerated it well.  Started on levophed drip.   Admitted to the ICU.    Mom Inez Catalina) and Step Dad Terie Purser) can be reached on Vic's cell phone at [phone]    Labs, EKGs, and imaging were reviewed and interpreted by myself and my attending, and incorporated in the medical decision making.  Patient was seen with ED Attending, Dr. Lucina Mellow, MD    Tori Milks, MD 11/04/14 4801  Leonard Schwartz, MD 11/17/14 [phone]

## 2014-11-03 NOTE — ED Notes (Signed)
2nd Unit Emergency Release PRBC started via pressure bag through hotline infusion system per MD Audie Pinto.

## 2014-11-03 NOTE — ED Notes (Signed)
1 unit emergency release PRBCs started via pressure bag through hotline infusion system per MD Joya Gaskins.

## 2014-11-04 ENCOUNTER — Inpatient Hospital Stay (HOSPITAL_COMMUNITY): Payer: MEDICAID

## 2014-11-04 ENCOUNTER — Inpatient Hospital Stay (HOSPITAL_COMMUNITY): Payer: Self-pay

## 2014-11-04 DIAGNOSIS — I427 Cardiomyopathy due to drug and external agent: Secondary | ICD-10-CM

## 2014-11-04 DIAGNOSIS — D62 Acute posthemorrhagic anemia: Secondary | ICD-10-CM | POA: Diagnosis present

## 2014-11-04 DIAGNOSIS — K922 Gastrointestinal hemorrhage, unspecified: Secondary | ICD-10-CM | POA: Diagnosis present

## 2014-11-04 DIAGNOSIS — Z452 Encounter for adjustment and management of vascular access device: Secondary | ICD-10-CM

## 2014-11-04 LAB — HEMOGLOBIN AND HEMATOCRIT, BLOOD
HCT: 20.7 % — ABNORMAL LOW (ref 39.0–52.0)
HCT: 21.1 % — ABNORMAL LOW (ref 39.0–52.0)
HCT: 21.2 % — ABNORMAL LOW (ref 39.0–52.0)
HCT: 21 % — ABNORMAL LOW (ref 39.0–52.0)
HEMATOCRIT: 21 % — AB (ref 39.0–52.0)
Hemoglobin: 7.4 g/dL — ABNORMAL LOW (ref 13.0–17.0)
Hemoglobin: 7.4 g/dL — ABNORMAL LOW (ref 13.0–17.0)
Hemoglobin: 7.4 g/dL — ABNORMAL LOW (ref 13.0–17.0)
Hemoglobin: 7.5 g/dL — ABNORMAL LOW (ref 13.0–17.0)
Hemoglobin: 7.5 g/dL — ABNORMAL LOW (ref 13.0–17.0)

## 2014-11-04 LAB — CBC
HEMATOCRIT: 21.9 % — AB (ref 39.0–52.0)
Hemoglobin: 7.8 g/dL — ABNORMAL LOW (ref 13.0–17.0)
MCH: 33.2 pg (ref 26.0–34.0)
MCHC: 35.6 g/dL (ref 30.0–36.0)
MCV: 93.2 fL (ref 78.0–100.0)
PLATELETS: 139 10*3/uL — AB (ref 150–400)
RBC: 2.35 MIL/uL — ABNORMAL LOW (ref 4.22–5.81)
RDW: 18.8 % — AB (ref 11.5–15.5)
WBC: 7.4 10*3/uL (ref 4.0–10.5)

## 2014-11-04 LAB — APTT
APTT: 37 s (ref 24–37)
aPTT: 37 seconds (ref 24–37)

## 2014-11-04 LAB — RENAL FUNCTION PANEL
ANION GAP: 15 (ref 5–15)
ANION GAP: 15 (ref 5–15)
Albumin: 2.9 g/dL — ABNORMAL LOW (ref 3.5–5.0)
Albumin: 2.7 g/dL — ABNORMAL LOW (ref 3.5–5.0)
Albumin: 2.8 g/dL — ABNORMAL LOW (ref 3.5–5.0)
Anion gap: 17 — ABNORMAL HIGH (ref 5–15)
BUN: 70 mg/dL — ABNORMAL HIGH (ref 6–20)
BUN: 58 mg/dL — AB (ref 6–20)
BUN: 53 mg/dL — ABNORMAL HIGH (ref 6–20)
CALCIUM: 6.3 mg/dL — AB (ref 8.9–10.3)
CALCIUM: 6.7 mg/dL — AB (ref 8.9–10.3)
CO2: 18 mmol/L — ABNORMAL LOW (ref 22–32)
CO2: 20 mmol/L — AB (ref 22–32)
CO2: 21 mmol/L — ABNORMAL LOW (ref 22–32)
Calcium: 6.5 mg/dL — ABNORMAL LOW (ref 8.9–10.3)
Chloride: 90 mmol/L — ABNORMAL LOW (ref 101–111)
Chloride: 94 mmol/L — ABNORMAL LOW (ref 101–111)
Chloride: 93 mmol/L — ABNORMAL LOW (ref 101–111)
Creatinine, Ser: 7.77 mg/dL — ABNORMAL HIGH (ref 0.61–1.24)
Creatinine, Ser: 6.35 mg/dL — ABNORMAL HIGH (ref 0.61–1.24)
Creatinine, Ser: 5.87 mg/dL — ABNORMAL HIGH (ref 0.61–1.24)
GFR calc Af Amer: 8 mL/min — ABNORMAL LOW (ref >60)
GFR calc non Af Amer: 7 mL/min — ABNORMAL LOW (ref >60)
GFR, EST AFRICAN AMERICAN: 10 mL/min — AB (ref >60)
GFR, EST AFRICAN AMERICAN: 11 mL/min — AB (ref >60)
GFR, EST NON AFRICAN AMERICAN: 9 mL/min — AB (ref >60)
GFR, EST NON AFRICAN AMERICAN: 10 mL/min — AB (ref >60)
GLUCOSE: 137 mg/dL — AB (ref 65–99)
Glucose, Bld: 135 mg/dL — ABNORMAL HIGH (ref 65–99)
Glucose, Bld: 127 mg/dL — ABNORMAL HIGH (ref 65–99)
POTASSIUM: 5.5 mmol/L — AB (ref 3.5–5.1)
POTASSIUM: 4.5 mmol/L (ref 3.5–5.1)
Phosphorus: 6.8 mg/dL — ABNORMAL HIGH (ref 2.5–4.6)
Phosphorus: 6.2 mg/dL — ABNORMAL HIGH (ref 2.5–4.6)
Phosphorus: 5.6 mg/dL — ABNORMAL HIGH (ref 2.5–4.6)
Potassium: 4.3 mmol/L (ref 3.5–5.1)
SODIUM: 129 mmol/L — AB (ref 135–145)
Sodium: 125 mmol/L — ABNORMAL LOW (ref 135–145)
Sodium: 129 mmol/L — ABNORMAL LOW (ref 135–145)

## 2014-11-04 LAB — POCT I-STAT 3, ART BLOOD GAS (G3+)
ACID-BASE DEFICIT: 6 mmol/L — AB (ref 0.0–2.0)
Bicarbonate: 17.8 mEq/L — ABNORMAL LOW (ref 20.0–24.0)
O2 SAT: 99 %
PH ART: 7.387 (ref 7.350–7.450)
TCO2: 19 mmol/L (ref 0–100)
pCO2 arterial: 29.5 mmHg — ABNORMAL LOW (ref 35.0–45.0)
pO2, Arterial: 120 mmHg — ABNORMAL HIGH (ref 80.0–100.0)

## 2014-11-04 LAB — BASIC METABOLIC PANEL
Anion gap: 17 — ABNORMAL HIGH (ref 5–15)
BUN: 70 mg/dL — AB (ref 6–20)
CALCIUM: 6.5 mg/dL — AB (ref 8.9–10.3)
CO2: 18 mmol/L — AB (ref 22–32)
Chloride: 89 mmol/L — ABNORMAL LOW (ref 101–111)
Creatinine, Ser: 7.71 mg/dL — ABNORMAL HIGH (ref 0.61–1.24)
GFR, EST AFRICAN AMERICAN: 8 mL/min — AB (ref >60)
GFR, EST NON AFRICAN AMERICAN: 7 mL/min — AB (ref >60)
Glucose, Bld: 133 mg/dL — ABNORMAL HIGH (ref 65–99)
Potassium: 5.4 mmol/L — ABNORMAL HIGH (ref 3.5–5.1)
Sodium: 124 mmol/L — ABNORMAL LOW (ref 135–145)

## 2014-11-04 LAB — TROPONIN I
TROPONIN I: 0.91 ng/mL — AB (ref <0.031)
Troponin I: 1.08 ng/mL (ref <0.031)

## 2014-11-04 LAB — AMYLASE: Amylase: 79 U/L (ref 28–100)

## 2014-11-04 LAB — GLUCOSE, CAPILLARY
GLUCOSE-CAPILLARY: 131 mg/dL — AB (ref 65–99)
Glucose-Capillary: 113 mg/dL — ABNORMAL HIGH (ref 65–99)
Glucose-Capillary: 124 mg/dL — ABNORMAL HIGH (ref 65–99)
Glucose-Capillary: 128 mg/dL — ABNORMAL HIGH (ref 65–99)
Glucose-Capillary: 134 mg/dL — ABNORMAL HIGH (ref 65–99)
Glucose-Capillary: 92 mg/dL (ref 65–99)

## 2014-11-04 LAB — PROTIME-INR
INR: 1.72 — ABNORMAL HIGH (ref 0.00–1.49)
PROTHROMBIN TIME: 20.2 s — AB (ref 11.6–15.2)

## 2014-11-04 LAB — CK TOTAL AND CKMB (NOT AT ARMC)
CK, MB: 7.6 ng/mL — AB (ref 0.5–5.0)
RELATIVE INDEX: 2 (ref 0.0–2.5)
Total CK: 385 U/L (ref 49–397)

## 2014-11-04 LAB — CORTISOL: Cortisol, Plasma: 28.8 ug/dL

## 2014-11-04 LAB — LIPASE, BLOOD: Lipase: 335 U/L — ABNORMAL HIGH (ref 22–51)

## 2014-11-04 LAB — MAGNESIUM
MAGNESIUM: 1.5 mg/dL — AB (ref 1.7–2.4)
Magnesium: 1.8 mg/dL (ref 1.7–2.4)

## 2014-11-04 LAB — MRSA PCR SCREENING: MRSA BY PCR: NEGATIVE

## 2014-11-04 LAB — TSH: TSH: 12.482 u[IU]/mL — ABNORMAL HIGH (ref 0.350–4.500)

## 2014-11-04 LAB — LACTIC ACID, PLASMA
LACTIC ACID, VENOUS: 4 mmol/L — AB (ref 0.5–2.0)
LACTIC ACID, VENOUS: 1.5 mmol/L (ref 0.5–2.0)

## 2014-11-04 LAB — SALICYLATE LEVEL: Salicylate Lvl: <4 mg/dL (ref 2.8–30.0)

## 2014-11-04 LAB — PHOSPHORUS
PHOSPHORUS: 8.8 mg/dL — AB (ref 2.5–4.6)
Phosphorus: 6.8 mg/dL — ABNORMAL HIGH (ref 2.5–4.6)

## 2014-11-04 LAB — PROCALCITONIN: Procalcitonin: 3.92 ng/mL

## 2014-11-04 LAB — AMMONIA: Ammonia: 55 umol/L — ABNORMAL HIGH (ref 9–35)

## 2014-11-04 LAB — ACETAMINOPHEN LEVEL: Acetaminophen (Tylenol), Serum: <10 ug/mL — ABNORMAL LOW (ref 10–30)

## 2014-11-04 MED ORDER — SODIUM CHLORIDE 0.9 % IV SOLN
1.0000 g | Freq: Once | INTRAVENOUS | Status: AC
  Administered 2014-11-04: 1 g via INTRAVENOUS
  Filled 2014-11-04: qty 10

## 2014-11-04 MED ORDER — LEVOTHYROXINE SODIUM 100 MCG PO TABS
100.0000 ug | ORAL_TABLET | Freq: Every day | ORAL | Status: DC
  Administered 2014-11-04 – 2014-11-18 (×15): 100 ug via ORAL
  Filled 2014-11-04 (×17): qty 1

## 2014-11-04 MED ORDER — PANTOPRAZOLE SODIUM 40 MG PO TBEC
40.0000 mg | DELAYED_RELEASE_TABLET | Freq: Every day | ORAL | Status: DC
  Administered 2014-11-04 – 2014-11-06 (×3): 40 mg via ORAL
  Filled 2014-11-04 (×3): qty 1

## 2014-11-04 MED ORDER — VITAMIN K1 10 MG/ML IJ SOLN
10.0000 mg | INTRAMUSCULAR | Status: AC
  Administered 2014-11-04: 10 mg via SUBCUTANEOUS
  Filled 2014-11-04: qty 5

## 2014-11-04 MED ORDER — SODIUM CHLORIDE 0.9 % IV SOLN
Freq: Once | INTRAVENOUS | Status: AC
  Administered 2014-11-04: 08:00:00 via INTRAVENOUS

## 2014-11-04 MED ORDER — PRISMASOL BGK 4/2.5 32-4-2.5 MEQ/L IV SOLN
INTRAVENOUS | Status: DC
  Administered 2014-11-04 – 2014-11-05 (×5): via INTRAVENOUS_CENTRAL
  Filled 2014-11-04 (×23): qty 5000

## 2014-11-04 MED ORDER — PRISMASOL BGK 4/2.5 32-4-2.5 MEQ/L IV SOLN
INTRAVENOUS | Status: DC
  Administered 2014-11-04: 18:00:00 via INTRAVENOUS_CENTRAL
  Filled 2014-11-04 (×3): qty 5000

## 2014-11-04 MED ORDER — CALCIUM CHLORIDE 10 % IV SOLN
1.0000 g | INTRAVENOUS | Status: AC
  Administered 2014-11-04: 1 g via INTRAVENOUS

## 2014-11-04 MED ORDER — AMPICILLIN-SULBACTAM SODIUM 3 (2-1) G IJ SOLR
3.0000 g | Freq: Three times a day (TID) | INTRAMUSCULAR | Status: DC
  Administered 2014-11-04 – 2014-11-05 (×5): 3 g via INTRAVENOUS
  Filled 2014-11-04 (×6): qty 3

## 2014-11-04 MED ORDER — DEXTROSE 5 % IV SOLN
2.0000 ug/min | INTRAVENOUS | Status: DC
  Filled 2014-11-04: qty 4

## 2014-11-04 MED ORDER — PRISMASOL BGK 4/2.5 32-4-2.5 MEQ/L IV SOLN
INTRAVENOUS | Status: DC
  Administered 2014-11-04 – 2014-11-05 (×2): via INTRAVENOUS_CENTRAL
  Filled 2014-11-04 (×6): qty 5000

## 2014-11-04 MED ORDER — DEXTROSE 5 % IV SOLN
30.0000 ug/min | INTRAVENOUS | Status: DC
  Administered 2014-11-04: 100 ug/min via INTRAVENOUS
  Filled 2014-11-04: qty 4

## 2014-11-04 MED ORDER — NOREPINEPHRINE BITARTRATE 1 MG/ML IV SOLN
2.0000 ug/min | INTRAVENOUS | Status: DC
  Administered 2014-11-04: 30 ug/min via INTRAVENOUS
  Administered 2014-11-04: 35 ug/min via INTRAVENOUS
  Administered 2014-11-05: 37 ug/min via INTRAVENOUS
  Administered 2014-11-05: 34 ug/min via INTRAVENOUS
  Administered 2014-11-05: 35 ug/min via INTRAVENOUS
  Administered 2014-11-06: 37 ug/min via INTRAVENOUS
  Administered 2014-11-06: 26 ug/min via INTRAVENOUS
  Administered 2014-11-07: 14 ug/min via INTRAVENOUS
  Filled 2014-11-04 (×10): qty 16

## 2014-11-04 NOTE — Procedures (Signed)
Central Venous Catheter Insertion Procedure Note Austin Padilla [id number] 1961/03/08  Procedure: Insertion of Central Venous Catheter for DCRRT/HD Indications: Assessment of intravascular volume, Drug and/or fluid administration and Frequent blood sampling  Procedure Details Consent: Risks of procedure as well as the alternatives and risks of each were explained to the (patient/caregiver).  Consent for procedure obtained. Time Out: Verified patient identification, verified procedure, site/side was marked, verified correct patient position, special equipment/implants available, medications/allergies/relevent history reviewed, required imaging and test results available.  Performed  Maximum sterile technique was used including antiseptics, cap, gloves, gown, hand hygiene, mask and sheet. Skin prep: Chlorhexidine; local anesthetic administered A antimicrobial bonded/coated triple lumen catheter was placed in the right internal jugular vein using the Seldinger technique.  Evaluation Blood flow good Complications: No apparent complications Patient did not tolerate procedure well. Chest X-ray ordered to verify placement.  CXR: normal.  Austin Padilla 11/04/2014, 12:24 AM  HD cath - insertiojn 54SF no complication Real time 2D ultrasound used for vein site selection, patency assessment, and needle entry. Ag / A record of image was made but could not be submitted for filing due to malfunction of printing device   Dr. Brand Males, M.D., [facility].C.P Pulmonary and Critical Care Medicine Staff Physician [place] [facility] Pager: [phone], If no answer or between  15:00h - 7:00h: call [phone]  11/04/2014 12:25 AM

## 2014-11-04 NOTE — Progress Notes (Signed)
Utilization Review Completed.Austin Padilla T7/08/2014  

## 2014-11-04 NOTE — Progress Notes (Signed)
  Echocardiogram 2D Echocardiogram has been performed.  [name] 11/04/2014, 10:56 AM

## 2014-11-04 NOTE — Progress Notes (Signed)
Afternoon renal function panel reported to Dr. Posey Pronto. Awaiting new orders

## 2014-11-04 NOTE — Progress Notes (Signed)
[facility] Progress Note Patient Name: Austin Padilla DOB: 1960/06/22 MRN: [id number]   Date of Service  11/04/2014  HPI/Events of Note  Wants to eat. Alert and orientded  eICU Interventions  Renal carb modified diet        Tysheem Accardo 11/04/2014, 7:20 PM

## 2014-11-04 NOTE — Progress Notes (Signed)
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 Retired 10 years ago  Updated - him. Anuric. Still on levophed. Normal tylenol. Normal Sal level    eICU Interventions  Check [place] CK      Intervention Category Intermediate Interventions: Communication with other healthcare providers and/or family  Glennette Galster 11/04/2014, 5:29 PM

## 2014-11-04 NOTE — Progress Notes (Signed)
[facility] Progress Note Patient Name: Austin Padilla DOB: November 28, 1960 MRN: [id number]   Date of Service  11/04/2014  HPI/Events of Note  Ca++ = 6.2 and albumin = 2.8. Ca++ corrects to 7.16.   eICU Interventions  Will order Calcium Gluconate 1 gm IV now.      Intervention Category Intermediate Interventions: Electrolyte abnormality - evaluation and management  Sommer,Steven Eugene 11/04/2014, 12:49 AM

## 2014-11-04 NOTE — Progress Notes (Signed)
ANTIBIOTIC CONSULT NOTE - INITIAL  Pharmacy Consult for Unasyn Indication: r/o sepsis  Allergies  Allergen Reactions  . Metoclopramide Hcl     REACTION: unspecified    Patient Measurements:    Vital Signs: Temp: 98.2 F (36.8 C) (07/04 0000) Temp Source: Rectal (07/03 2016) BP: 90/49 mmHg (07/04 0000) Pulse Rate: 66 (07/04 0000) Intake/Output from previous day:   Intake/Output from this shift:    Labs:  Recent Labs  11/03/14 2000 11/03/14 2020 11/03/14 2050 11/03/14 2313 11/03/14 2335  WBC 8.2  --   --  7.5  --   HGB 6.2* 7.1*  --  7.7* 7.8*  PLT 139*  --   --  123*  --   CREATININE  --  11.20* 10.04*  --  10.70*   CrCl cannot be calculated (Unknown ideal weight.). No results for input(s): VANCOTROUGH, VANCOPEAK, VANCORANDOM, GENTTROUGH, GENTPEAK, GENTRANDOM, TOBRATROUGH, TOBRAPEAK, TOBRARND, AMIKACINPEAK, AMIKACINTROU, AMIKACIN in the last 72 hours.   Microbiology: No results found for this or any previous visit (from the past 720 hour(s)).  Medical History: Past Medical History  Diagnosis Date  . ANXIETY 04/25/2008    Qualifier: Diagnosis of  By: Sherren Mocha MD, Jory Ee   . DEPRESSION 04/25/2008    Qualifier: Diagnosis of  By: Sherren Mocha MD, Jory Ee   . HYPERTENSION 04/25/2008    Qualifier: Diagnosis of  By: Sherren Mocha MD, Jory Ee   . GERD 04/25/2008    Qualifier: Diagnosis of  By: Sherren Mocha MD, Jory Ee   . DIVERTICULOSIS, COLON 06/13/2009    Qualifier: Diagnosis of  By: Sherren Mocha MD, [place], PERIRECTAL 08/05/2008    Qualifier: Diagnosis of  By: Sherlynn Stalls, CMA, [place]    . ABDOMINAL PAIN, ACUTE 05/23/2009    Qualifier: Diagnosis of  By: Sherren Mocha MD, Jory Ee   . Alcoholism   . Essential tremor     Medications:  Awaiting home med rec  Assessment: 54 y.o. male presents with acute renal failure, GIB, ETOH abuse. Found lying on the floor of his home. To begin Unasyn for r/o sepsis. WBC wnl. LA 3.98. SCr 10.7 - to start CRRT.  Goal of Therapy:  Resolution of  infection  Plan:  Unasyn 3gm IV q8h (CRRT dosing) Will f/u CRRT initiation/tolerance, micro data, and pt's clinical condition  Sherlon Handing, PharmD, BCPS Clinical pharmacist, pager [phone] 11/04/2014,12:13 AM

## 2014-11-04 NOTE — Progress Notes (Signed)
Patient ID: Austin Padilla, male   DOB: [birth year], 54 y.o.   MRN: [id number]  [facility] Progress Note    Assessment/ Plan:   1. AKI: This is likely ATN in the setting of volume depletion with ongoing ARB/NSAIDs-he is anuric and I will continue CRRT at this time. Discontinue sodium bicarbonate drip and attempt some ultrafiltration with CRRT now that his CVP is 29. Clinically, appears to be doing well and I anticipate improvement of urine output within the next 1-2 days.  2. Hyperkalemia: Secondary to acute renal failure, improving with CRRT-recheck labs today at 2 PM to assess her need to adjust dialysate bath 3. Hyponatremia: Monitor with CRRT-with history of alcohol abuse/cirrhosis, may indeed have chronic hyponatremia and will need caution not to over correct/correct rapidly 4. Anemia: With GI bleed and now status post PRBC, await GI input (? Varices) 5. Metabolic acidosis: Improving with CRRT, discontinue sodium bicarbonate drip and monitor  Subjective:   No acute events overnight-CVP 29 this morning. Patient reports to be feeling well and denies any pain    Objective:   BP 101/53 mmHg  Pulse 69  Temp(Src) 96.2 F (35.7 C) (Core (Comment))  Resp 12  Ht 5\' 11"  (1.803 m)  Wt 112 kg (246 lb 14.6 oz)  BMI 34.45 kg/m2  SpO2 100%  Intake/Output Summary (Last 24 hours) at 11/04/14 0759 Last data filed at 11/04/14 0700  Gross per 24 hour  Intake 2742.73 ml  Output    358 ml  Net 2384.73 ml   Weight change:   Physical Exam: Gen: Comfortably resting in bed, awakens to voice CVS: Pulse regular in rate and rhythm, S1 and S2 normal Resp: Clear to auscultation, no rales Abd: Soft, obese, nontender Ext: No lower extremity edema noted  Imaging: Dg Chest Port 1 View  11/04/2014   CLINICAL DATA:  GI bleed.  Line placement.  EXAM: PORTABLE CHEST - 1 VIEW  COMPARISON:  Chest x-ray from the same date at 00:09  FINDINGS: Right IJ central line, tip still at the lower SVC.   Normal heart size for technique. Negative aortic and hilar contours.  Stable lung inflation. No edema, effusion, consolidation, or air leak.  IMPRESSION: Cleared lung opacity compatible with resolved edema or atelectasis.   Electronically Signed   By: Monte Fantasia M.D.   On: 11/04/2014 05:43   Dg Chest Port 1 View  11/04/2014   CLINICAL DATA:  Central line placement.  Initial encounter.  EXAM: PORTABLE CHEST - 1 VIEW  COMPARISON:  None.  FINDINGS: A right IJ line is noted ending about the mid SVC.  The lungs are mildly hypoexpanded. Vascular congestion is noted, with mildly increased interstitial markings, concerning for mild interstitial edema. No pleural effusion or pneumothorax is seen.  The cardiomediastinal silhouette is borderline normal in size. No acute osseous abnormalities are identified.  IMPRESSION: 1. Right IJ line noted ending about the mid SVC. 2. Lungs mildly hypoexpanded. Vascular congestion, with mildly increased interstitial markings, raising question for mild interstitial edema.   Electronically Signed   By: Garald Balding M.D.   On: 11/04/2014 00:33    Labs: BMET  Recent Labs Lab 11/03/14 2020 11/03/14 2050 11/03/14 2313 11/03/14 2335 11/04/14 0500  NA 118* 118* 121* 122* 124*  125*  K 7.0* 6.9* 6.1* 6.1* 5.4*  5.5*  CL 89* 86* 88* 93* 89*  90*  CO2  --  12* 11*  --  18*  18*  GLUCOSE 101* 101* 107* 107*  133*  135*  BUN 97* 88* 88* 89* 70*  70*  CREATININE 11.20* 10.04* 9.60* 10.70* 7.71*  7.77*  CALCIUM  --  6.2* 6.2*  --  6.5*  6.5*  PHOS  --   --  8.8*  --  6.8*  6.8*   CBC  Recent Labs Lab 11/03/14 2000  11/03/14 2313 11/03/14 2335 11/04/14 0255 11/04/14 0500  WBC 8.2  --  7.5  --   --  7.4  HGB 6.2*  < > 7.7* 7.8* 7.4* 7.8*  HCT 18.0*  < > 22.7* 23.0* 21.0* 21.9*  MCV 100.6*  --  97.8  --   --  93.2  PLT 139*  --  123*  --   --  139*  < > = values in this interval not displayed.  Medications:    . sodium chloride  10 mL/hr Intravenous  Once  . sodium chloride   Intravenous Once  . ampicillin-sulbactam (UNASYN) IV  3 g Intravenous Q8H    Elmarie Shiley, MD 11/04/2014, 7:59 AM

## 2014-11-05 ENCOUNTER — Inpatient Hospital Stay (HOSPITAL_COMMUNITY): Payer: Self-pay

## 2014-11-05 LAB — RENAL FUNCTION PANEL
ALBUMIN: 2.6 g/dL — AB (ref 3.5–5.0)
Albumin: 2.5 g/dL — ABNORMAL LOW (ref 3.5–5.0)
Anion gap: 11 (ref 5–15)
Anion gap: 13 (ref 5–15)
BUN: 42 mg/dL — ABNORMAL HIGH (ref 6–20)
BUN: 46 mg/dL — ABNORMAL HIGH (ref 6–20)
CALCIUM: 7 mg/dL — AB (ref 8.9–10.3)
CHLORIDE: 97 mmol/L — AB (ref 101–111)
CO2: 23 mmol/L (ref 22–32)
CO2: 21 mmol/L — ABNORMAL LOW (ref 22–32)
CREATININE: 4.83 mg/dL — AB (ref 0.61–1.24)
CREATININE: 5.08 mg/dL — AB (ref 0.61–1.24)
Calcium: 7 mg/dL — ABNORMAL LOW (ref 8.9–10.3)
Chloride: 95 mmol/L — ABNORMAL LOW (ref 101–111)
GFR calc Af Amer: 15 mL/min — ABNORMAL LOW (ref >60)
GFR calc Af Amer: 14 mL/min — ABNORMAL LOW (ref >60)
GFR calc non Af Amer: 13 mL/min — ABNORMAL LOW (ref >60)
GFR calc non Af Amer: 12 mL/min — ABNORMAL LOW (ref >60)
GLUCOSE: 141 mg/dL — AB (ref 65–99)
Glucose, Bld: 129 mg/dL — ABNORMAL HIGH (ref 65–99)
PHOSPHORUS: 4.5 mg/dL (ref 2.5–4.6)
PHOSPHORUS: 4.3 mg/dL (ref 2.5–4.6)
POTASSIUM: 5 mmol/L (ref 3.5–5.1)
Potassium: 4.5 mmol/L (ref 3.5–5.1)
SODIUM: 131 mmol/L — AB (ref 135–145)
Sodium: 129 mmol/L — ABNORMAL LOW (ref 135–145)

## 2014-11-05 LAB — POCT I-STAT 3, VENOUS BLOOD GAS (G3P V)
Acid-base deficit: 6 mmol/L — ABNORMAL HIGH (ref 0.0–2.0)
Bicarbonate: 17.4 mEq/L — ABNORMAL LOW (ref 20.0–24.0)
O2 Saturation: 80 %
PH VEN: 7.44 — AB (ref 7.250–7.300)
Patient temperature: 98
TCO2: 18 mmol/L (ref 0–100)
pCO2, Ven: 25.5 mmHg — ABNORMAL LOW (ref 45.0–50.0)
pO2, Ven: 40 mmHg (ref 30.0–45.0)

## 2014-11-05 LAB — CBC
HCT: 20.2 % — ABNORMAL LOW (ref 39.0–52.0)
HCT: 20.6 % — ABNORMAL LOW (ref 39.0–52.0)
HEMOGLOBIN: 7 g/dL — AB (ref 13.0–17.0)
Hemoglobin: 7.1 g/dL — ABNORMAL LOW (ref 13.0–17.0)
MCH: 33.5 pg (ref 26.0–34.0)
MCH: 33 pg (ref 26.0–34.0)
MCHC: 35.1 g/dL (ref 30.0–36.0)
MCHC: 34 g/dL (ref 30.0–36.0)
MCV: 95.3 fL (ref 78.0–100.0)
MCV: 97.2 fL (ref 78.0–100.0)
PLATELETS: 112 10*3/uL — AB (ref 150–400)
Platelets: 103 10*3/uL — ABNORMAL LOW (ref 150–400)
RBC: 2.12 MIL/uL — ABNORMAL LOW (ref 4.22–5.81)
RBC: 2.12 MIL/uL — AB (ref 4.22–5.81)
RDW: 19.3 % — ABNORMAL HIGH (ref 11.5–15.5)
RDW: 19.7 % — ABNORMAL HIGH (ref 11.5–15.5)
WBC: 7.7 10*3/uL (ref 4.0–10.5)
WBC: 8.1 10*3/uL (ref 4.0–10.5)

## 2014-11-05 LAB — COMPREHENSIVE METABOLIC PANEL
ALT: 49 U/L (ref 17–63)
AST: 152 U/L — ABNORMAL HIGH (ref 15–41)
Albumin: 2.8 g/dL — ABNORMAL LOW (ref 3.5–5.0)
Alkaline Phosphatase: 213 U/L — ABNORMAL HIGH (ref 38–126)
Anion gap: 22 — ABNORMAL HIGH (ref 5–15)
BILIRUBIN TOTAL: 4 mg/dL — AB (ref 0.3–1.2)
BUN: 88 mg/dL — AB (ref 6–20)
CO2: 11 mmol/L — ABNORMAL LOW (ref 22–32)
Calcium: 6.2 mg/dL — CL (ref 8.9–10.3)
Chloride: 88 mmol/L — ABNORMAL LOW (ref 101–111)
Creatinine, Ser: 9.6 mg/dL — ABNORMAL HIGH (ref 0.61–1.24)
GFR calc Af Amer: 6 mL/min — ABNORMAL LOW (ref >60)
GFR calc non Af Amer: 5 mL/min — ABNORMAL LOW (ref >60)
Glucose, Bld: 107 mg/dL — ABNORMAL HIGH (ref 65–99)
POTASSIUM: 6.1 mmol/L — AB (ref 3.5–5.1)
Sodium: 121 mmol/L — ABNORMAL LOW (ref 135–145)
Total Protein: 6.2 g/dL — ABNORMAL LOW (ref 6.5–8.1)

## 2014-11-05 LAB — MAGNESIUM: Magnesium: 2.2 mg/dL (ref 1.7–2.4)

## 2014-11-05 LAB — GLUCOSE, CAPILLARY
GLUCOSE-CAPILLARY: 136 mg/dL — AB (ref 65–99)
GLUCOSE-CAPILLARY: 124 mg/dL — AB (ref 65–99)
GLUCOSE-CAPILLARY: 137 mg/dL — AB (ref 65–99)
Glucose-Capillary: 120 mg/dL — ABNORMAL HIGH (ref 65–99)
Glucose-Capillary: 133 mg/dL — ABNORMAL HIGH (ref 65–99)
Glucose-Capillary: 144 mg/dL — ABNORMAL HIGH (ref 65–99)

## 2014-11-05 LAB — PREPARE FRESH FROZEN PLASMA: Unit division: 0

## 2014-11-05 LAB — APTT: aPTT: 38 seconds — ABNORMAL HIGH (ref 24–37)

## 2014-11-05 LAB — HEMOGLOBIN AND HEMATOCRIT, BLOOD
HEMATOCRIT: 20.9 % — AB (ref 39.0–52.0)
HEMOGLOBIN: 7.2 g/dL — AB (ref 13.0–17.0)

## 2014-11-05 LAB — BLOOD PRODUCT ORDER (VERBAL) VERIFICATION

## 2014-11-05 LAB — TROPONIN I: Troponin I: 0.61 ng/mL (ref <0.031)

## 2014-11-05 MED ORDER — ALTEPLASE 2 MG IJ SOLR
2.0000 mg | Freq: Once | INTRAMUSCULAR | Status: AC | PRN
  Administered 2014-11-05: 2 mg
  Filled 2014-11-05 (×3): qty 2

## 2014-11-05 MED ORDER — HEPARIN SODIUM (PORCINE) 1000 UNIT/ML IJ SOLN
1000.0000 [IU] | Freq: Once | INTRAMUSCULAR | Status: AC
  Administered 2014-11-05: 1200 [IU] via INTRAVENOUS

## 2014-11-05 MED ORDER — PHYTONADIONE 5 MG PO TABS
10.0000 mg | ORAL_TABLET | Freq: Every day | ORAL | Status: DC
  Administered 2014-11-05 – 2014-11-07 (×3): 10 mg via ORAL
  Filled 2014-11-05 (×5): qty 2

## 2014-11-05 MED ORDER — OXYCODONE HCL 5 MG PO TABS
5.0000 mg | ORAL_TABLET | ORAL | Status: DC | PRN
  Administered 2014-11-05 – 2014-11-07 (×7): 5 mg via ORAL
  Filled 2014-11-05 (×8): qty 1

## 2014-11-05 MED ORDER — SODIUM CHLORIDE 0.9 % IV SOLN
3.0000 g | Freq: Two times a day (BID) | INTRAVENOUS | Status: DC
  Administered 2014-11-05 – 2014-11-06 (×2): 3 g via INTRAVENOUS
  Filled 2014-11-05 (×3): qty 3

## 2014-11-05 MED ORDER — PNEUMOCOCCAL VAC POLYVALENT 25 MCG/0.5ML IJ INJ
0.5000 mL | INJECTION | INTRAMUSCULAR | Status: AC
  Administered 2014-11-06: 0.5 mL via INTRAMUSCULAR
  Filled 2014-11-05: qty 0.5

## 2014-11-05 MED ORDER — LIDOCAINE HCL (PF) 1 % IJ SOLN
INTRAMUSCULAR | Status: AC
  Filled 2014-11-05: qty 5

## 2014-11-05 NOTE — Progress Notes (Signed)
ANTIBIOTIC CONSULT NOTE - FOLLOW UP  Pharmacy Consult for Unasyn Indication: rule out sepsis  Allergies  Allergen Reactions  . Metoclopramide Hcl     REACTION: unspecified    Patient Measurements: Height: 5\' 11"  (180.3 cm) Weight: 249 lb 9 oz (113.2 kg) IBW/kg (Calculated) : 75.3  Vital Signs: Temp: 99.6 F (37.6 C) (07/05 1030) Temp Source: Core (Comment) (07/05 0800) BP: 104/51 mmHg (07/05 1030) Pulse Rate: 79 (07/05 1030) Intake/Output from previous day: 07/04 0701 - 07/05 0700 In: 1749.6 [P.O.:336; I.V.:1113.6; IV Piggyback:300] Out: 1569 [Urine:65] Intake/Output from this shift: Total I/O In: 458.4 [P.O.:240; I.V.:118.4; IV Piggyback:100] Out: -   Labs:  Recent Labs  11/03/14 2313  11/04/14 0500  11/04/14 1400 11/04/14 1700 11/04/14 2100 11/05/14 0100 11/05/14 0458  WBC 7.5  --  7.4  --   --   --   --   --  7.7  HGB 7.7*  < > 7.8*  < > 7.4* 7.5* 7.5* 7.2* 7.1*  PLT 123*  --  139*  --   --   --   --   --  112*  CREATININE 9.60*  < > 7.71*  7.77*  --  6.35* 5.87*  --   --  4.83*  < > = values in this interval not displayed. Estimated Creatinine Clearance: 22.6 mL/min (by C-G formula based on Cr of 4.83). No results for input(s): VANCOTROUGH, VANCOPEAK, VANCORANDOM, GENTTROUGH, GENTPEAK, GENTRANDOM, TOBRATROUGH, TOBRAPEAK, TOBRARND, AMIKACINPEAK, AMIKACINTROU, AMIKACIN in the last 72 hours.   Microbiology: Recent Results (from the past 720 hour(s))  MRSA PCR Screening     Status: None   Collection Time: 11/04/14 12:58 AM  Result Value Ref Range Status   MRSA by PCR NEGATIVE NEGATIVE Final    Comment:        The GeneXpert MRSA Assay (FDA approved for NASAL specimens only), is one component of a comprehensive MRSA colonization surveillance program. It is not intended to diagnose MRSA infection nor to guide or monitor treatment for MRSA infections.     Anti-infectives    Start     Dose/Rate Route Frequency Ordered Stop   11/04/14 0100   Ampicillin-Sulbactam (UNASYN) 3 g in sodium chloride 0.9 % 100 mL IVPB     3 g 100 mL/hr over 60 Minutes Intravenous Every 8 hours 11/04/14 0021        Assessment: 60 YOM who presented on 7/4 with shock in the setting of a LGIB. The patient is noted to have acute kidney injury related to the shock and was initiated on CRRT on 7/4. CRRT was turned off this morning and labs are to be rechecked this afternoon to determine additional plans. The patient continues on Unasyn for r/o sepsis - will plan to adjust the dose this morning while CRRT held.   Unasyn 7/4 >>  7/3 BCx >> 7/4 UCx >>  Goal of Therapy:  Proper antibiotics for infection/cultures adjusted for renal/hepatic function   Plan:  1. Adjust Unasyn to 3g IV every 12 hours 2. Will continue to follow renal function, culture results, LOT, and antibiotic de-escalation plans   Alycia Rossetti, PharmD, BCPS Clinical Pharmacist Pager: [phone] 11/05/2014 1:07 PM

## 2014-11-05 NOTE — Progress Notes (Signed)
Patient ID: Austin Padilla, male   DOB: 1960-07-22, 54 y.o.   MRN: [id number]   [facility] Progress Note    Assessment/ Plan:   1. AKI: This is likely ATN in the setting of volume depletion with ongoing ARB/NSAIDs-remains anuric and without any evident renal recovery. Had problems with CRRT overnight with filter clotting (attempting catheter TPA at this time). We'll recheck labs today again at 2 PM to decide on restarting CRRT based on potassium levels  2. Hyperkalemia: Secondary to acute renal failure, improved with CRRT-currently 5.0 following problematic dialysis overnight due to system clotting. Recheck labs again at 2 PM to decide on need to restart  3. Hyponatremia: sodium levels stable/low-suspect that he has baseline hyponatremia with history of cirrhosis  4. Anemia: With GI bleed and now status post PRBC, hemoglobin stable and may no longer need every 4 hours H&H   Subjective:   Reports to be doing better-denies any nausea or vomiting after allowed to eat last night. On Levophed    Objective:   BP 104/50 mmHg  Pulse 75  Temp(Src) 98.9 F (37.2 C) (Core (Comment))  Resp 12  Ht 5\' 11"  (1.803 m)  Wt 113.2 kg (249 lb 9 oz)  BMI 34.82 kg/m2  SpO2 96%  Intake/Output Summary (Last 24 hours) at 11/05/14 0739 Last data filed at 11/05/14 0700  Gross per 24 hour  Intake 1749.64 ml  Output   1569 ml  Net 180.64 ml   Weight change: 1.2 kg (2 lb 10.3 oz)  Physical Exam: Gen: Comfortably resting in bed  CVS: Regular in rate and rhythm, S1 and S2 normal  Resp: Clear to auscultation, no rales  Abd: Soft, obese, nontender  Ext: Trace ankle edema   Imaging: US Abdomen Complete  11/04/2014   CLINICAL DATA:  Alcoholic cirrhosis, renal failure, hypertension  EXAM: ULTRASOUND ABDOMEN COMPLETE  COMPARISON:  CT abdomen and pelvis 08/09/2011  FINDINGS: Gallbladder: Distended gallbladder containing sludge. No definite shadowing calculi. No gallbladder wall thickening,  pericholecystic fluid or sonographic Murphy sign.  Common bile duct: Diameter: 3 mm diameter, normal  Liver: Heterogeneous increased echogenicity with slightly nodular margins compatible with cirrhosis. Hepatopetal portal venous flow. No discrete hepatic mass identified, though assessment of intrahepatic detail is limited by sound attenuation.  IVC: Normal appearance  Pancreas: Obscured by bowel gas  Spleen: Appears mildly enlarged, 1 cm length, calculated volume 548 mL. No focal abnormalities  Right Kidney: Length: 12.5 cm. Normal morphology without mass or hydronephrosis. Small amount of perinephric fluid.  Left Kidney: Length: 12.1 cm. Normal morphology without mass or hydronephrosis.  Abdominal aorta: Predominately obscured by bowel gas.  Other findings: Small amount ascites.  Small LEFT pleural effusion.  IMPRESSION: Significant sludge within gallbladder without definite shadowing calculi.  Cirrhotic appearing liver with mild splenomegaly.  Incomplete visualization of pancreas and aorta.  Small amount of nonspecific RIGHT perinephric fluid.   Electronically Signed   By: Lavonia Dana M.D.   On: 11/04/2014 09:56   Dg Chest Port 1 View  11/05/2014   CLINICAL DATA:  Central line placement  EXAM: PORTABLE CHEST - 1 VIEW  COMPARISON:  11/04/2014  FINDINGS: New left IJ central line is kinked at the tip, with tip directed to the left. Arterial and venous structures overlap in this region, but reportedly blood gas confirms venous placement. The tip could be directed into the distal left subclavian vein or a branch vessel (including IMV). No pneumothorax.  Increased opacification of the lower chest with lower  lung volumes. No overt pulmonary edema or pleural effusion. Stable mild cardiomegaly.  These results were called by telephone at the time of interpretation on 11/05/2014 at 1:54 am to [place] via ICU RN.  IMPRESSION: 1. Abnormal positioning and kinking of the new left IJ dialysis catheter, as above. 2. No  pneumothorax. 3. Worsening aeration since yesterday.   Electronically Signed   By: Monte Fantasia M.D.   On: 11/05/2014 01:58   Dg Chest Port 1 View  11/04/2014   CLINICAL DATA:  GI bleed.  Line placement.  EXAM: PORTABLE CHEST - 1 VIEW  COMPARISON:  Chest x-ray from the same date at 00:09  FINDINGS: Right IJ central line, tip still at the lower SVC.  Normal heart size for technique. Negative aortic and hilar contours.  Stable lung inflation. No edema, effusion, consolidation, or air leak.  IMPRESSION: Cleared lung opacity compatible with resolved edema or atelectasis.   Electronically Signed   By: Monte Fantasia M.D.   On: 11/04/2014 05:43   Dg Chest Port 1 View  11/04/2014   CLINICAL DATA:  Central line placement.  Initial encounter.  EXAM: PORTABLE CHEST - 1 VIEW  COMPARISON:  None.  FINDINGS: A right IJ line is noted ending about the mid SVC.  The lungs are mildly hypoexpanded. Vascular congestion is noted, with mildly increased interstitial markings, concerning for mild interstitial edema. No pleural effusion or pneumothorax is seen.  The cardiomediastinal silhouette is borderline normal in size. No acute osseous abnormalities are identified.  IMPRESSION: 1. Right IJ line noted ending about the mid SVC. 2. Lungs mildly hypoexpanded. Vascular congestion, with mildly increased interstitial markings, raising question for mild interstitial edema.   Electronically Signed   By: Garald Balding M.D.   On: 11/04/2014 00:33    Labs: BMET  Recent Labs Lab 11/03/14 2050 11/03/14 2313 11/03/14 2335 11/04/14 0500 11/04/14 1400 11/04/14 1700 11/05/14 0458  NA 118* 121* 122* 124*  125* 129* 129* 131*  K 6.9* 6.1* 6.1* 5.4*  5.5* 4.3 4.5 5.0  CL 86* 88* 93* 89*  90* 94* 93* 97*  CO2 12* 11*  --  18*  18* 20* 21* 23  GLUCOSE 101* 107* 107* 133*  135* 127* 137* 129*  BUN 88* 88* 89* 70*  70* 58* 53* 42*  CREATININE 10.04* 9.60* 10.70* 7.71*  7.77* 6.35* 5.87* 4.83*  CALCIUM 6.2* 6.2*  --  6.5*   6.5* 6.3* 6.7* 7.0*  PHOS  --  8.8*  --  6.8*  6.8* 6.2* 5.6* 4.5   CBC  Recent Labs Lab 11/03/14 2000  11/03/14 2313  11/04/14 0500  11/04/14 1700 11/04/14 2100 11/05/14 0100 11/05/14 0458  WBC 8.2  --  7.5  --  7.4  --   --   --   --  7.7  HGB 6.2*  < > 7.7*  < > 7.8*  < > 7.5* 7.5* 7.2* 7.1*  HCT 18.0*  < > 22.7*  < > 21.9*  < > 21.2* 21.0* 20.9* 20.2*  MCV 100.6*  --  97.8  --  93.2  --   --   --   --  95.3  PLT 139*  --  123*  --  139*  --   --   --   --  112*  < > = values in this interval not displayed.  Medications:    . sodium chloride  10 mL/hr Intravenous Once  . ampicillin-sulbactam (UNASYN) IV  3 g Intravenous  Q8H  . levothyroxine  100 mcg Oral QAC breakfast  . lidocaine (PF)      . pantoprazole  40 mg Oral Daily   Elmarie Shiley, MD 11/05/2014, 7:39 AM

## 2014-11-05 NOTE — Progress Notes (Addendum)
11/04/14-11/05/14 Pt return pressures running very high, requiring multiple interventions. Return port trouble shooting done with second RN Mick Sell. Able to flush line relatively easily, get blood return, and patient holding head still; however, return pressures remain very high. 2336 Filter clotted for second time in twelve hours. CCM NP attempted to reposition line slightly. Restarted CRRT to no improvement. Unable to obtain second HD cath. Nephro MD Lorrene Reid orders to increase PBP rate to 600. CCM NP resutured and repositioned right HD cath.  Improved pressure levels. Will continue to monitor. 2863 Return pressures rising again, continue to trouble shoot line with second RN. Still able to flush line relatively easily, get blood return, and pt holding still. 8177 Filter clotted. Nephro MD paged.

## 2014-11-05 NOTE — Progress Notes (Signed)
TPA removed from blue HD cap, flushed with 10cc NS followed by Heparin 1.41m (1200 units/ml) to dwell in port, blue dead end cap placed on line and red High Dose sticker on tubing. Nurse updated regarding the pt's HD line. Marianna Payment

## 2014-11-05 NOTE — Clinical Documentation Improvement (Addendum)
Query #1 For clarity and accurate coding purposes, please document in the progress notes and discharge summary if the diagnosis of Sepsis and/or Septic Shock have been:   - Ruled Out and are not applicable to this admission  - Confirmed and are applicable to this admission  - Unable to clinically determine  Thank You, Erling Conte ,RN Clinical Documentation Specialist:  [facility]

## 2014-11-05 NOTE — Procedures (Signed)
Hemodialysis Insertion Procedure Note Austin Padilla [id number] [birth year]  Procedure: Insertion of Hemodialysis Catheter Type: 3 port  Indications: Hemodialysis   Procedure Details Consent: Risks of procedure as well as the alternatives and risks of each were explained to the (patient/caregiver).  Consent for procedure obtained. Time Out: Verified patient identification, verified procedure, site/side was marked, verified correct patient position, special equipment/implants available, medications/allergies/relevent history reviewed, required imaging and test results available.  Performed  Maximum sterile technique was used including antiseptics, cap, gloves, gown, hand hygiene, mask and sheet. Skin prep: Chlorhexidine; local anesthetic administered A antimicrobial bonded/coated triple lumen catheter was placed in the left internal jugular vein using the Seldinger technique. Ultrasound guidance used.Yes.   Catheter placed to 18 cm. Blood aspirated via all 3 ports and then flushed x 3. Line sutured x 2 and dressing applied.  Evaluation Blood flow good Complications: No apparent complications Patient did tolerate procedure well. Chest X-ray ordered to verify placement.  CXR: pending.  Georgann Housekeeper, AGACNP-BC [facility] Pulmonology/Critical Care Pager [phone] or [phone]  11/05/2014 1:26 AM   Merton Border, MD ; [facility] service Mobile [phone].  After 5:30 PM or weekends, call [phone]

## 2014-11-05 NOTE — Progress Notes (Signed)
PULMONARY / CRITICAL CARE MEDICINE   Name: Austin Padilla MRN: [id number] DOB: 28-Oct-1960    ADMISSION DATE:  11/03/2014 CONSULTATION DATE:  11/03/2014  REFERRING MD : Dr Audie Pinto of ER and Tori Milks ER resident  CHIEF COMPLAINT:  Shock , gi bleed, aTN, hyperkalemia  INITIAL PRESENTATION:  57 M alcoholic and hx of rectal abscesses presented 7/03 with 3 days of bloody diarrhea in the midst of heavy EtOH and NSAIDS consumption. Family found him weak and in stool 11/03/2014 . Found by family confused and incontinent of stool. Hypotensive in ED with AKI, acidosis, hyperkalemia  MAJOR EVENTS/TEST RESULTS: 7/04 Abd [place]: Significant sludge within gallbladder without definite shadowing calculi. Cirrhotic appearing liver with mild splenomegaly 7/04 TTE: LVEF 60-65%. Moderate MR. LA severely dilated. Diastolic dysfunction 7/89 CRRT initiated 7/05 cognition intact. Remained on high dose norepi. Remained oligo-anuric. HD catheter clotted. CRRT suspended  INDWELLING DEVICES:: R IJ HD cath 7/04 >>   MICRO DATA: MRSA PCR 7/04 >> NEG Urine 7/04 >>  Blood 7/04 >>   ANTIMICROBIALS:  Amp-sulbactam 7/04 >>    SUBJECTIVE:  RASS 0. + F/C.   VITAL SIGNS: Temp:  [91.6 F (33.1 C)-99.6 F (37.6 C)] 99.6 F (37.6 C) (07/05 0830) Pulse Rate:  [66-80] 80 (07/05 0830) Resp:  [10-28] 17 (07/05 0830) BP: (82-111)/(40-80) 97/49 mmHg (07/05 0830) SpO2:  [92 %-100 %] 96 % (07/05 0830) Weight:  [113.2 kg (249 lb 9 oz)] 113.2 kg (249 lb 9 oz) (07/05 0500) HEMODYNAMICS: CVP:  [16 mmHg-23 mmHg] 23 mmHg VENTILATOR SETTINGS:   INTAKE / OUTPUT:  Intake/Output Summary (Last 24 hours) at 11/05/14 0940 Last data filed at 11/05/14 0800  Gross per 24 hour  Intake 1643.17 ml  Output   1495 ml  Net 148.17 ml    PHYSICAL EXAMINATION: General:  NAD, RASS -1 Neuro:  No focal deficits HEENT:  WNL Cardiovascular: reg, no M noted Lungs:  Clear Abdomen:  Soft, +BS Ext: warm, trace pedal edema, B feet very  tender Skin: no lesions noted  LABS:  CBC  Recent Labs Lab 11/03/14 2313  11/04/14 0500  11/04/14 2100 11/05/14 0100 11/05/14 0458  WBC 7.5  --  7.4  --   --   --  7.7  HGB 7.7*  < > 7.8*  < > 7.5* 7.2* 7.1*  HCT 22.7*  < > 21.9*  < > 21.0* 20.9* 20.2*  PLT 123*  --  139*  --   --   --  112*  < > = values in this interval not displayed. Coag's  Recent Labs Lab 11/03/14 2000 11/03/14 2313 11/04/14 0500 11/05/14 0458  APTT  --  37 37 38*  INR 1.77* 1.72*  --   --    BMET  Recent Labs Lab 11/04/14 1400 11/04/14 1700 11/05/14 0458  NA 129* 129* 131*  K 4.3 4.5 5.0  CL 94* 93* 97*  CO2 20* 21* 23  BUN 58* 53* 42*  CREATININE 6.35* 5.87* 4.83*  GLUCOSE 127* 137* 129*   Electrolytes  Recent Labs Lab 11/03/14 2313 11/04/14 0500 11/04/14 1400 11/04/14 1700 11/05/14 0458  CALCIUM 6.2* 6.5*  6.5* 6.3* 6.7* 7.0*  MG 1.5* 1.8  --   --  2.2  PHOS 8.8* 6.8*  6.8* 6.2* 5.6* 4.5   Sepsis Markers  Recent Labs Lab 11/03/14 2313 11/03/14 2335 11/04/14 1400  LATICACIDVEN 4.0* 3.98* 1.5  PROCALCITON 3.92  --   --    ABG  Recent Labs Lab 11/04/14  0531  PHART 7.387  PCO2ART 29.5*  PO2ART 120.0*   Liver Enzymes  Recent Labs Lab 11/03/14 2050 11/03/14 2313  11/04/14 1400 11/04/14 1700 11/05/14 0458  AST 153* 152*  --   --   --   --   ALT 50 49  --   --   --   --   ALKPHOS 223* 213*  --   --   --   --   BILITOT 3.5* 4.0*  --   --   --   --   ALBUMIN 2.8* 2.8*  < > 2.7* 2.8* 2.6*  < > = values in this interval not displayed. Cardiac Enzymes  Recent Labs Lab 11/03/14 2313 11/04/14 0500 11/04/14 1131  TROPONINI 0.61* 0.91* 1.08*   Glucose  Recent Labs Lab 11/04/14 1233 11/04/14 1554 11/04/14 2001 11/05/14 0047 11/05/14 0343 11/05/14 0751  GLUCAP 124* 134* 128* 144* 136* 124*    CXR: CM, mild edema, LLL atx vs eff    ASSESSMENT / PLAN:  PULMONARY A:  Mild pulm edema P:  Caution with volume Supplemental O2 as needed to  maintain SpO2 > 92%   CARDIOVASCULAR A: Circulatory shock- likely mixed. Hemorrhagic +/- septic +/- cardiogenic P:  CVP goal 8-12 MAP goal > 65 mmHg Cont vasopressors  RENAL A:  AKI, anuric - likely ATN/shock + NSAIDS Hyperkalemia, resolved Metabolic acidosis P:  Monitor BMET intermittently Monitor I/Os Correct electrolytes as indicated  GASTROINTESTINAL A: Elevated LFTs - likely alcoholic hepatitis Cirrhosis by abd [place] Rectal bleeding - appears resolved P:  SUP: PPI Cont diet as tolerated Monitor LFTs Needs EtOH abstinence  HEMATOLOGIC A:  Acute blood loss anemia Thrombocytopenia Coagulopathy - likely due to liver dz P:  DVT px: SQ heparin Monitor CBC intermittently Transfuse per usual ICU guidelines Cont Vitamin K  INFECTIOUS A:  Elevated PCT Possible severe sepsis - unclear source P:  Micro and abx as above  ENDOCRINE A:  Mild hyperglycemia without prior dx of DM Elevated TSH P:  Check T3, T4 Consider L thyroxine Consider SSI if glu > 180  NEUROLOGIC A:  Alcohol abuse P:  Monitor for withdrawal   40 mins CCM time  Merton Border, MD ; [facility] service Mobile [phone].  After 5:30 PM or weekends, call [phone]

## 2014-11-06 ENCOUNTER — Inpatient Hospital Stay (HOSPITAL_COMMUNITY): Payer: Self-pay

## 2014-11-06 DIAGNOSIS — R6521 Severe sepsis with septic shock: Secondary | ICD-10-CM

## 2014-11-06 DIAGNOSIS — A047 Enterocolitis due to Clostridium difficile: Secondary | ICD-10-CM

## 2014-11-06 DIAGNOSIS — A419 Sepsis, unspecified organism: Secondary | ICD-10-CM

## 2014-11-06 DIAGNOSIS — K922 Gastrointestinal hemorrhage, unspecified: Secondary | ICD-10-CM

## 2014-11-06 LAB — GLUCOSE, CAPILLARY
GLUCOSE-CAPILLARY: 114 mg/dL — AB (ref 65–99)
GLUCOSE-CAPILLARY: 112 mg/dL — AB (ref 65–99)
Glucose-Capillary: 104 mg/dL — ABNORMAL HIGH (ref 65–99)
Glucose-Capillary: 119 mg/dL — ABNORMAL HIGH (ref 65–99)

## 2014-11-06 LAB — CLOSTRIDIUM DIFFICILE BY PCR: Toxigenic C. Difficile by PCR: POSITIVE — AB

## 2014-11-06 LAB — COMPREHENSIVE METABOLIC PANEL
ALT: 50 U/L (ref 17–63)
AST: 149 U/L — ABNORMAL HIGH (ref 15–41)
Albumin: 2.6 g/dL — ABNORMAL LOW (ref 3.5–5.0)
Alkaline Phosphatase: 248 U/L — ABNORMAL HIGH (ref 38–126)
Anion gap: 13 (ref 5–15)
BILIRUBIN TOTAL: 3.4 mg/dL — AB (ref 0.3–1.2)
BUN: 50 mg/dL — ABNORMAL HIGH (ref 6–20)
CHLORIDE: 93 mmol/L — AB (ref 101–111)
CO2: 23 mmol/L (ref 22–32)
Calcium: 7.3 mg/dL — ABNORMAL LOW (ref 8.9–10.3)
Creatinine, Ser: 4.95 mg/dL — ABNORMAL HIGH (ref 0.61–1.24)
GFR calc non Af Amer: 12 mL/min — ABNORMAL LOW (ref >60)
GFR, EST AFRICAN AMERICAN: 14 mL/min — AB (ref >60)
GLUCOSE: 119 mg/dL — AB (ref 65–99)
Potassium: 4.7 mmol/L (ref 3.5–5.1)
Sodium: 129 mmol/L — ABNORMAL LOW (ref 135–145)
TOTAL PROTEIN: 6.5 g/dL (ref 6.5–8.1)

## 2014-11-06 LAB — URINE CULTURE

## 2014-11-06 LAB — CBC
HEMATOCRIT: 21.3 % — AB (ref 39.0–52.0)
Hemoglobin: 7.2 g/dL — ABNORMAL LOW (ref 13.0–17.0)
MCH: 33 pg (ref 26.0–34.0)
MCHC: 33.8 g/dL (ref 30.0–36.0)
MCV: 97.7 fL (ref 78.0–100.0)
Platelets: 107 10*3/uL — ABNORMAL LOW (ref 150–400)
RBC: 2.18 MIL/uL — ABNORMAL LOW (ref 4.22–5.81)
RDW: 19.3 % — ABNORMAL HIGH (ref 11.5–15.5)
WBC: 8.3 10*3/uL (ref 4.0–10.5)

## 2014-11-06 LAB — RENAL FUNCTION PANEL
ALBUMIN: 2.4 g/dL — AB (ref 3.5–5.0)
Anion gap: 12 (ref 5–15)
BUN: 53 mg/dL — ABNORMAL HIGH (ref 6–20)
CALCIUM: 7.2 mg/dL — AB (ref 8.9–10.3)
CO2: 23 mmol/L (ref 22–32)
Chloride: 93 mmol/L — ABNORMAL LOW (ref 101–111)
Creatinine, Ser: 4.13 mg/dL — ABNORMAL HIGH (ref 0.61–1.24)
GFR calc Af Amer: 18 mL/min — ABNORMAL LOW (ref >60)
GFR, EST NON AFRICAN AMERICAN: 15 mL/min — AB (ref >60)
Glucose, Bld: 108 mg/dL — ABNORMAL HIGH (ref 65–99)
Phosphorus: 5 mg/dL — ABNORMAL HIGH (ref 2.5–4.6)
Potassium: 4.6 mmol/L (ref 3.5–5.1)
Sodium: 128 mmol/L — ABNORMAL LOW (ref 135–145)

## 2014-11-06 LAB — MAGNESIUM: MAGNESIUM: 2 mg/dL (ref 1.7–2.4)

## 2014-11-06 LAB — APTT: APTT: 42 s — AB (ref 24–37)

## 2014-11-06 LAB — PREPARE RBC (CROSSMATCH)

## 2014-11-06 LAB — CORTISOL: Cortisol, Plasma: 14.5 ug/dL

## 2014-11-06 LAB — T4, FREE: Free T4: 0.84 ng/dL (ref 0.61–1.12)

## 2014-11-06 MED ORDER — SODIUM CHLORIDE 0.9 % IV SOLN
Freq: Once | INTRAVENOUS | Status: AC
  Administered 2014-11-06: 14:00:00 via INTRAVENOUS

## 2014-11-06 MED ORDER — FUROSEMIDE 10 MG/ML IJ SOLN
80.0000 mg | Freq: Once | INTRAMUSCULAR | Status: AC
  Administered 2014-11-06: 80 mg via INTRAVENOUS
  Filled 2014-11-06: qty 8

## 2014-11-06 MED ORDER — OXYCODONE HCL 5 MG PO TABS
5.0000 mg | ORAL_TABLET | Freq: Once | ORAL | Status: AC
  Administered 2014-11-06: 5 mg via ORAL

## 2014-11-06 MED ORDER — VANCOMYCIN 50 MG/ML ORAL SOLUTION
500.0000 mg | Freq: Four times a day (QID) | ORAL | Status: DC
  Administered 2014-11-06 – 2014-11-18 (×49): 500 mg via ORAL
  Filled 2014-11-06 (×54): qty 10

## 2014-11-06 MED ORDER — HEPARIN SODIUM (PORCINE) 1000 UNIT/ML IJ SOLN
5000.0000 [IU] | Freq: Three times a day (TID) | INTRAMUSCULAR | Status: DC
  Filled 2014-11-06 (×3): qty 5

## 2014-11-06 MED ORDER — HEPARIN SODIUM (PORCINE) 5000 UNIT/ML IJ SOLN: 5000.0000 [IU] | Freq: Three times a day (TID) | INTRAMUSCULAR | Status: DC

## 2014-11-06 MED ORDER — METRONIDAZOLE IN NACL 5-0.79 MG/ML-% IV SOLN
500.0000 mg | Freq: Three times a day (TID) | INTRAVENOUS | Status: DC
  Administered 2014-11-06 – 2014-11-08 (×6): 500 mg via INTRAVENOUS
  Filled 2014-11-06 (×8): qty 100

## 2014-11-06 MED ORDER — FAMOTIDINE IN NACL 20-0.9 MG/50ML-% IV SOLN
20.0000 mg | Freq: Two times a day (BID) | INTRAVENOUS | Status: DC
  Administered 2014-11-07: 20 mg via INTRAVENOUS
  Filled 2014-11-06 (×2): qty 50

## 2014-11-06 NOTE — Progress Notes (Signed)
Patient ID: Austin Padilla, male   DOB: [birth year], 54 y.o.   MRN: [id number]  [facility] Progress Note   Assessment/ Plan:   1. Acute renal failure possibly on chronic kidney disease: likely ATN in the setting of volume depletion with ongoing ARB/NSAIDs-remains anuric and without any evident renal recovery. Surprisingly labs this morning unchanged since yesterday with regards to BUN/creatinine. Without critical hyperkalemia/profound metabolic acidosis or other concerning findings to prompt initiation of RRT. Remains pressor dependent (being weaned). Will try a dose of furosemide to see if we can augment urine output (currently anuric) after bladder scan.  2. Hyperkalemia: Corrected with dialysis/CRRT-potassium this morning appears to be within acceptable limits. 3. Hyponatremia: sodium levels stable/low-suspect that he has baseline hyponatremia with history of cirrhosis  4. Anemia: With GI bleed and now status post PRBC, hemoglobin stable and may no longer need every 4 hours H&H  5. Shock/sepsis: Suspected mixed etiology hemorrhagic/cardiogenic. Blood cultures/urine cultures negative and remains on empiric Unasyn  Subjective:   Reports to be feeling comfortable and denies any chest pain or shortness of breath-has not voided since Foley catheter was removed yesterday. On Levophed    Objective:   BP 118/59 mmHg  Pulse 69  Temp(Src) 98.1 F (36.7 C) (Oral)  Resp 11  Ht 5\' 11"  (1.803 m)  Wt 116.2 kg (256 lb 2.8 oz)  BMI 35.74 kg/m2  SpO2 97%  Intake/Output Summary (Last 24 hours) at 11/06/14 0725 Last data filed at 11/06/14 0600  Gross per 24 hour  Intake   1359 ml  Output      0 ml  Net   1359 ml   Weight change: 3 kg (6 lb 9.8 oz)  Physical Exam: Gen: Comfortably resting in bed -glad that Foley catheter was taken out CVS: Regular in rate and rhythm, S1 and S2 normal  Resp: Clear to auscultation, no rales  Abd: Soft, obese, nontender  Ext: Trace ankle edema    Imaging: US Abdomen Complete  11/04/2014   CLINICAL DATA:  Alcoholic cirrhosis, renal failure, hypertension  EXAM: ULTRASOUND ABDOMEN COMPLETE  COMPARISON:  CT abdomen and pelvis 08/09/2011  FINDINGS: Gallbladder: Distended gallbladder containing sludge. No definite shadowing calculi. No gallbladder wall thickening, pericholecystic fluid or sonographic Murphy sign.  Common bile duct: Diameter: 3 mm diameter, normal  Liver: Heterogeneous increased echogenicity with slightly nodular margins compatible with cirrhosis. Hepatopetal portal venous flow. No discrete hepatic mass identified, though assessment of intrahepatic detail is limited by sound attenuation.  IVC: Normal appearance  Pancreas: Obscured by bowel gas  Spleen: Appears mildly enlarged, 1 cm length, calculated volume 548 mL. No focal abnormalities  Right Kidney: Length: 12.5 cm. Normal morphology without mass or hydronephrosis. Small amount of perinephric fluid.  Left Kidney: Length: 12.1 cm. Normal morphology without mass or hydronephrosis.  Abdominal aorta: Predominately obscured by bowel gas.  Other findings: Small amount ascites.  Small LEFT pleural effusion.  IMPRESSION: Significant sludge within gallbladder without definite shadowing calculi.  Cirrhotic appearing liver with mild splenomegaly.  Incomplete visualization of pancreas and aorta.  Small amount of nonspecific RIGHT perinephric fluid.   Electronically Signed   By: Lavonia Dana M.D.   On: 11/04/2014 09:56   Dg Chest Port 1 View  11/06/2014   CLINICAL DATA:  Respiratory failure  EXAM: PORTABLE CHEST - 1 VIEW  COMPARISON:  11/05/2014  FINDINGS: Right IJ central line, tip at the distal SVC. Previously seen left IJ catheter has been removed.  Stable heart size and mediastinal  contours.  Streaky opacity in the left lower lung. No edema, effusion, or pneumothorax.  IMPRESSION: 1. Focal opacity at the left base could reflect pneumonia or pneumonitis. 2. The remaining central line is in good  position.   Electronically Signed   By: Monte Fantasia M.D.   On: 11/06/2014 05:24   Dg Chest Port 1 View  11/05/2014   CLINICAL DATA:  Central line placement  EXAM: PORTABLE CHEST - 1 VIEW  COMPARISON:  11/04/2014  FINDINGS: New left IJ central line is kinked at the tip, with tip directed to the left. Arterial and venous structures overlap in this region, but reportedly blood gas confirms venous placement. The tip could be directed into the distal left subclavian vein or a branch vessel (including IMV). No pneumothorax.  Increased opacification of the lower chest with lower lung volumes. No overt pulmonary edema or pleural effusion. Stable mild cardiomegaly.  These results were called by telephone at the time of interpretation on 11/05/2014 at 1:54 am to [place] via ICU RN.  IMPRESSION: 1. Abnormal positioning and kinking of the new left IJ dialysis catheter, as above. 2. No pneumothorax. 3. Worsening aeration since yesterday.   Electronically Signed   By: Monte Fantasia M.D.   On: 11/05/2014 01:58    Labs: BMET  Recent Labs Lab 11/03/14 2313 11/03/14 2335 11/04/14 0500 11/04/14 1400 11/04/14 1700 11/05/14 0458 11/05/14 1400 11/06/14 0524  NA 121* 122* 124*  125* 129* 129* 131* 129* 129*  K 6.1* 6.1* 5.4*  5.5* 4.3 4.5 5.0 4.5 4.7  CL 88* 93* 89*  90* 94* 93* 97* 95* 93*  CO2 11*  --  18*  18* 20* 21* 23 21* 23  GLUCOSE 107* 107* 133*  135* 127* 137* 129* 141* 119*  BUN 88* 89* 70*  70* 58* 53* 42* 46* 50*  CREATININE 9.60* 10.70* 7.71*  7.77* 6.35* 5.87* 4.83* 5.08* 4.95*  CALCIUM 6.2*  --  6.5*  6.5* 6.3* 6.7* 7.0* 7.0* 7.3*  PHOS 8.8*  --  6.8*  6.8* 6.2* 5.6* 4.5 4.3  --    CBC  Recent Labs Lab 11/04/14 0500  11/05/14 0100 11/05/14 0458 11/05/14 1400 11/06/14 0524  WBC 7.4  --   --  7.7 8.1 8.3  HGB 7.8*  < > 7.2* 7.1* 7.0* 7.2*  HCT 21.9*  < > 20.9* 20.2* 20.6* 21.3*  MCV 93.2  --   --  95.3 97.2 97.7  PLT 139*  --   --  112* 103* 107*  < > = values in  this interval not displayed.  Medications:    . sodium chloride  10 mL/hr Intravenous Once  . ampicillin-sulbactam (UNASYN) IV  3 g Intravenous Q12H  . levothyroxine  100 mcg Oral QAC breakfast  . pantoprazole  40 mg Oral Daily  . phytonadione  10 mg Oral Daily  . pneumococcal 23 valent vaccine  0.5 mL Intramuscular Tomorrow-1000   Elmarie Shiley, MD 11/06/2014, 7:25 AM

## 2014-11-06 NOTE — Progress Notes (Signed)
PULMONARY / CRITICAL CARE MEDICINE   Name: Austin Padilla MRN: [id number] DOB: 10-04-1960    ADMISSION DATE:  11/03/2014 CONSULTATION DATE:  11/03/2014  REFERRING MD :  Dr Audie Pinto of ER and Tori Milks ER resident  CHIEF COMPLAINT:  Shock , gi bleed, aTN, hyperkalemia  INITIAL PRESENTATION: 43 M alcoholic and hx of rectal abscesses presented 7/03 with 3 days of bloody diarrhea in the midst of heavy EtOH and NSAIDS consumption. Family found him weak and in stool 11/03/2014 . Found by family confused and incontinent of stool. Hypotensive in ED with AKI, acidosis, hyperkalemia  MAJOR EVENTS/TEST RESULTS: 7/04 Abd [place]: Significant sludge within gallbladder without definite shadowing calculi. Cirrhotic appearing liver with mild splenomegaly 7/04 TTE: LVEF 60-65%. Moderate MR. LA severely dilated. Diastolic dysfunction 4/09 CRRT initiated 7/05 cognition intact. Remained on high dose norepi. Remained oligo-anuric. HD catheter clotted. CRRT suspended  INDWELLING DEVICES:: R IJ HD cath 7/04 >> 7/5  MICRO DATA: MRSA PCR 7/04 >> NEG Urine 7/04 >>  Blood 7/04 >>   ANTIMICROBIALS:  Amp-sulbactam 7/04 >> 7/6  SIGNIFICANT EVENTS: 7/3 admitted to ICU     SUBJECTIVE:   Difficulty urinating overnight, unable to urinate this AM, bladder scan 400 ml, else NAE  I/O cath of 850 amber colored urine  VITAL SIGNS: Temp:  [97.7 F (36.5 C)-99.8 F (37.7 C)] 97.7 F (36.5 C) (07/06 0751) Pulse Rate:  [66-81] 68 (07/06 0715) Resp:  [9-24] 10 (07/06 0715) BP: (87-132)/(42-62) 114/55 mmHg (07/06 0715) SpO2:  [93 %-99 %] 95 % (07/06 0715) Weight:  [256 lb 2.8 oz (116.2 kg)] 256 lb 2.8 oz (116.2 kg) (07/06 0500) HEMODYNAMICS: CVP:  [23 mmHg] 23 mmHg VENTILATOR SETTINGS:   INTAKE / OUTPUT:  Intake/Output Summary (Last 24 hours) at 11/06/14 0825 Last data filed at 11/06/14 0600  Gross per 24 hour  Intake 1226.2 ml  Output      0 ml  Net 1226.2 ml    PHYSICAL EXAMINATION: General:  NAD Neuro: No focal deficits HEENT: WNL Cardiovascular: RRR no murmurs Lungs: CTAB Abdomen: Soft, +BS Ext: warm, trace pedal edema, bilateral feet tenderness improved Skin: bruises ober bilateral legs  LABS:  CBC  Recent Labs Lab 11/05/14 0458 11/05/14 1400 11/06/14 0524  WBC 7.7 8.1 8.3  HGB 7.1* 7.0* 7.2*  HCT 20.2* 20.6* 21.3*  PLT 112* 103* 107*   Coag's  Recent Labs Lab 11/03/14 2000  11/03/14 2313 11/04/14 0500 11/05/14 0458 11/06/14 0524  APTT  --   < > 37 37 38* 42*  INR 1.77*  --  1.72*  --   --   --   < > = values in this interval not displayed. BMET  Recent Labs Lab 11/05/14 0458 11/05/14 1400 11/06/14 0524  NA 131* 129* 129*  K 5.0 4.5 4.7  CL 97* 95* 93*  CO2 23 21* 23  BUN 42* 46* 50*  CREATININE 4.83* 5.08* 4.95*  GLUCOSE 129* 141* 119*   Electrolytes  Recent Labs Lab 11/04/14 0500  11/04/14 1700 11/05/14 0458 11/05/14 1400 11/06/14 0524  CALCIUM 6.5*  6.5*  < > 6.7* 7.0* 7.0* 7.3*  MG 1.8  --   --  2.2  --  2.0  PHOS 6.8*  6.8*  < > 5.6* 4.5 4.3  --   < > = values in this interval not displayed. Sepsis Markers  Recent Labs Lab 11/03/14 2313 11/03/14 2335 11/04/14 1400  LATICACIDVEN 4.0* 3.98* 1.5  PROCALCITON 3.92  --   --  ABG  Recent Labs Lab 11/04/14 0531  PHART 7.387  PCO2ART 29.5*  PO2ART 120.0*   Liver Enzymes  Recent Labs Lab 11/03/14 2050 11/03/14 2313  11/05/14 0458 11/05/14 1400 11/06/14 0524  AST 153* 152*  --   --   --  149*  ALT 50 49  --   --   --  50  ALKPHOS 223* 213*  --   --   --  248*  BILITOT 3.5* 4.0*  --   --   --  3.4*  ALBUMIN 2.8* 2.8*  < > 2.6* 2.5* 2.6*  < > = values in this interval not displayed. Cardiac Enzymes  Recent Labs Lab 11/03/14 2313 11/04/14 0500 11/04/14 1131  TROPONINI 0.61* 0.91* 1.08*   Glucose  Recent Labs Lab 11/05/14 0751 11/05/14 1112 11/05/14 1528 11/05/14 1934 11/05/14 2353 11/06/14 0400  GLUCAP 124* 133* 120* 137* 119* 114*     Imaging Dg Chest Port 1 View  11/06/2014   CLINICAL DATA:  Respiratory failure  EXAM: PORTABLE CHEST - 1 VIEW  COMPARISON:  11/05/2014  FINDINGS: Right IJ central line, tip at the distal SVC. Previously seen left IJ catheter has been removed.  Stable heart size and mediastinal contours.  Streaky opacity in the left lower lung. No edema, effusion, or pneumothorax.  IMPRESSION: 1. Focal opacity at the left base could reflect pneumonia or pneumonitis. 2. The remaining central line is in good position.   Electronically Signed   By: Monte Fantasia M.D.   On: 11/06/2014 05:24     ASSESSMENT / PLAN:  PULMONARY A:  Mild pulm edema P:  Caution with volume Supplemental O2 as needed to maintain SpO2 > 92%  CARDIOVASCULAR A: Circulatory shock- likely mixed. Hemorrhagic +/- septic +/- cardiogenic P:  CVP goal 8-12 MAP goal > 65 mmHg S/p pressors  RENAL A:  AKI on possible CKD, anuric - likely ATN/shock + NSAIDS Hyperkalemia, resolved Metabolic acidosis P:  Monitor BMET intermittently Monitor I/Os Correct electrolytes as indicated Renal following, appreciate recs Per Renal furosemide 80 x1 this AM, f/u output  GASTROINTESTINAL A: Stably elevated LFTs (AST) - likely alcoholic hepatitis Cirrhosis by abd [place] Rectal bleeding - appears resolved P:  SUP: PPI Cont diet as tolerated Monitor electrolytes, LFTs Needs EtOH abstinence Consider CIWA if shows s/s of with drawal F/u c diff  HEMATOLOGIC A:  Acute blood loss anemia- resolved bleeding Thrombocytopenia Coagulopathy - likely due to liver dz P:  DVT px: SQ heparin Monitor CBC intermittently,  Transfuse per usual ICU guidelines Cont Vitamin K  INFECTIOUS A:  Elevated PCT Possible septic shock on admission but now with no evidence of septic shock P:  D diff PCR +, start vanc and flagyl  D/c Unasyn     ENDOCRINE A:  Mild hyperglycemia without prior dx of DM Elevated TSH P:  T4 nl, f/u  T3 Consider L thyroxine Consider SSI if glu > 180  NEUROLOGIC A:  Alcohol abuse, per pt last etoh 3 days ago, drinks 5 drinks of whiskey per day P:  Monitor for withdrawal    Dispo: D/c to floor pending clinical stability, likely today  Alyssa A. Lincoln Brigham MD, [facility] Resident PGY-1 Pager [phone]   11/06/2014, 8:25 AM  PCCM ATTENDING: I have reviewed pt's initial presentation, consultants notes and hospital database in detail.  The above assessment and plan was formulated under my direction.  In summary: Overall much improved Cognition fully intact No distress Remains dependent on vasopressors On AM  rounds, we discussed causes of persistent shock Subsequently, C diff has come back positive Likely cause of hypotension is septic shock Anemia noted - received one unit PRBCs today Uo improving and Cr is coming down. Hopefully will not require further HD  Therapy for C diff initiated today Cont to wean vasopressors for MAP > 60 mmHg Will be able to transfer out of ICU once he is off vasopressors  Merton Border, MD;  PCCM service; Mobile [phone]

## 2014-11-07 LAB — COMPREHENSIVE METABOLIC PANEL
ALT: 47 U/L (ref 17–63)
AST: 131 U/L — AB (ref 15–41)
Albumin: 2.5 g/dL — ABNORMAL LOW (ref 3.5–5.0)
Alkaline Phosphatase: 261 U/L — ABNORMAL HIGH (ref 38–126)
Anion gap: 11 (ref 5–15)
BILIRUBIN TOTAL: 3.6 mg/dL — AB (ref 0.3–1.2)
BUN: 55 mg/dL — ABNORMAL HIGH (ref 6–20)
CHLORIDE: 92 mmol/L — AB (ref 101–111)
CO2: 24 mmol/L (ref 22–32)
Calcium: 7.3 mg/dL — ABNORMAL LOW (ref 8.9–10.3)
Creatinine, Ser: 3.59 mg/dL — ABNORMAL HIGH (ref 0.61–1.24)
GFR calc Af Amer: 21 mL/min — ABNORMAL LOW (ref >60)
GFR, EST NON AFRICAN AMERICAN: 18 mL/min — AB (ref >60)
Glucose, Bld: 102 mg/dL — ABNORMAL HIGH (ref 65–99)
POTASSIUM: 4.8 mmol/L (ref 3.5–5.1)
SODIUM: 127 mmol/L — AB (ref 135–145)
TOTAL PROTEIN: 6.5 g/dL (ref 6.5–8.1)

## 2014-11-07 LAB — TYPE AND SCREEN
ABO/RH(D): B POS
Antibody Screen: NEGATIVE
Unit division: 0
Unit division: 0
Unit division: 0

## 2014-11-07 LAB — IRON AND TIBC
IRON: 41 ug/dL — AB (ref 45–182)
Saturation Ratios: 16 % — ABNORMAL LOW (ref 17.9–39.5)
TIBC: 252 ug/dL (ref 250–450)
UIBC: 211 ug/dL

## 2014-11-07 LAB — T3, FREE: T3 FREE: 1.7 pg/mL — AB (ref 2.0–4.4)

## 2014-11-07 LAB — CBC
HEMATOCRIT: 22.8 % — AB (ref 39.0–52.0)
Hemoglobin: 7.8 g/dL — ABNORMAL LOW (ref 13.0–17.0)
MCH: 32.5 pg (ref 26.0–34.0)
MCHC: 34.2 g/dL (ref 30.0–36.0)
MCV: 95 fL (ref 78.0–100.0)
Platelets: 98 10*3/uL — ABNORMAL LOW (ref 150–400)
RBC: 2.4 MIL/uL — AB (ref 4.22–5.81)
RDW: 20.3 % — AB (ref 11.5–15.5)
WBC: 7.7 10*3/uL (ref 4.0–10.5)

## 2014-11-07 LAB — FERRITIN: Ferritin: 479 ng/mL — ABNORMAL HIGH (ref 24–336)

## 2014-11-07 LAB — MAGNESIUM: Magnesium: 1.6 mg/dL — ABNORMAL LOW (ref 1.7–2.4)

## 2014-11-07 LAB — PROCALCITONIN: PROCALCITONIN: 1.15 ng/mL

## 2014-11-07 LAB — PROTIME-INR
INR: 1.44 (ref 0.00–1.49)
Prothrombin Time: 17.6 seconds — ABNORMAL HIGH (ref 11.6–15.2)

## 2014-11-07 MED ORDER — FUROSEMIDE 10 MG/ML IJ SOLN
80.0000 mg | Freq: Once | INTRAMUSCULAR | Status: AC
  Administered 2014-11-07: 80 mg via INTRAVENOUS
  Filled 2014-11-07: qty 8

## 2014-11-07 MED ORDER — MAGNESIUM OXIDE 400 (241.3 MG) MG PO TABS
400.0000 mg | ORAL_TABLET | Freq: Every day | ORAL | Status: DC
  Administered 2014-11-07 – 2014-11-18 (×12): 400 mg via ORAL
  Filled 2014-11-07 (×12): qty 1

## 2014-11-07 MED ORDER — FUROSEMIDE 10 MG/ML IJ SOLN
INTRAMUSCULAR | Status: AC
  Filled 2014-11-07: qty 4

## 2014-11-07 MED ORDER — OXYCODONE HCL 5 MG PO TABS
5.0000 mg | ORAL_TABLET | Freq: Once | ORAL | Status: AC
  Administered 2014-11-07: 5 mg via ORAL
  Filled 2014-11-07: qty 1

## 2014-11-07 MED ORDER — OXYCODONE HCL 5 MG PO TABS
10.0000 mg | ORAL_TABLET | ORAL | Status: DC | PRN
Start: 2014-11-08 — End: 2014-11-08
  Administered 2014-11-08: 10 mg via ORAL
  Filled 2014-11-07: qty 2

## 2014-11-07 NOTE — Progress Notes (Signed)
PULMONARY / CRITICAL CARE MEDICINE   Name: Austin Padilla MRN: [id number] DOB: [birth year]    ADMISSION DATE:  11/03/2014 CONSULTATION DATE:  11/03/2014  REFERRING MD :  Dr Audie Pinto of ER and Tori Milks ER resident  CHIEF COMPLAINT:  Shock , gi bleed, aTN, hyperkalemia  INITIAL PRESENTATION: 63 M alcoholic and hx of rectal abscesses presented 7/03 with 3 days of bloody diarrhea in the midst of heavy EtOH and NSAIDS consumption. Family found him weak and in stool 11/03/2014 . Found by family confused and incontinent of stool. Hypotensive in ED with AKI, acidosis, hyperkalemia  MAJOR EVENTS/TEST RESULTS: 7/04 Abd [place]: Significant sludge within gallbladder without definite shadowing calculi. Cirrhotic appearing liver with mild splenomegaly 7/04 TTE: LVEF 60-65%. Moderate MR. LA severely dilated. Diastolic dysfunction 8/10 CRRT initiated 7/05 cognition intact. Remained on high dose norepi. Remained oligo-anuric. HD catheter clotted. CRRT suspended 7/06 C diff + started on Vanc/flagyl   INDWELLING DEVICES:: R IJ HD cath 7/04 >> 7/5  MICRO DATA: MRSA PCR 7/04 >> NEG Urine 7/04 >> contaminated specimen Blood 7/04 >> NEG C diff PCR 7/06 >> POS  ANTIMICROBIALS:  Amp-sulbactam 7/04 >> 7/6 Metronidazole 7/06 >>  PO vanc 7/06 >>    SUBJECTIVE:   No events overnight  I/O cath of 850 amber colored urine  VITAL SIGNS: Temp:  [98 F (36.7 C)-98.2 F (36.8 C)] 98.1 F (36.7 C) (07/07 0733) Pulse Rate:  [29-85] 74 (07/07 0600) Resp:  [9-22] 14 (07/07 0600) BP: (76-132)/(34-69) 110/59 mmHg (07/07 0600) SpO2:  [93 %-100 %] 96 % (07/07 0600) Weight:  [257 lb 11.1 oz (116.89 kg)] 257 lb 11.1 oz (116.89 kg) (07/07 0355) HEMODYNAMICS:   VENTILATOR SETTINGS:   INTAKE / OUTPUT:  Intake/Output Summary (Last 24 hours) at 11/07/14 0848 Last data filed at 11/07/14 0600  Gross per 24 hour  Intake 1247.67 ml  Output   2150 ml  Net -902.33 ml    PHYSICAL EXAMINATION: General:  NAD Neuro: No focal deficits HEENT: WNL Cardiovascular: RRR no murmurs Lungs: CTAB Abdomen: Soft, +BS Ext: warm, trace pedal edema, bilateral feet tenderness improved Skin: bruises over bilateral legs  LABS:  CBC  Recent Labs Lab 11/05/14 1400 11/06/14 0524 11/07/14 0358  WBC 8.1 8.3 7.7  HGB 7.0* 7.2* 7.8*  HCT 20.6* 21.3* 22.8*  PLT 103* 107* 98*   Coag's  Recent Labs Lab 11/03/14 2000  11/03/14 2313 11/04/14 0500 11/05/14 0458 11/06/14 0524 11/07/14 0358  APTT  --   < > 37 37 38* 42*  --   INR 1.77*  --  1.72*  --   --   --  1.44  < > = values in this interval not displayed. BMET  Recent Labs Lab 11/06/14 0524 11/06/14 1645 11/07/14 0358  NA 129* 128* 127*  K 4.7 4.6 4.8  CL 93* 93* 92*  CO2 23 23 24   BUN 50* 53* 55*  CREATININE 4.95* 4.13* 3.59*  GLUCOSE 119* 108* 102*   Electrolytes  Recent Labs Lab 11/05/14 0458 11/05/14 1400 11/06/14 0524 11/06/14 1645 11/07/14 0358  CALCIUM 7.0* 7.0* 7.3* 7.2* 7.3*  MG 2.2  --  2.0  --  1.6*  PHOS 4.5 4.3  --  5.0*  --    Sepsis Markers  Recent Labs Lab 11/03/14 2313 11/03/14 2335 11/04/14 1400 11/07/14 0358  LATICACIDVEN 4.0* 3.98* 1.5  --   PROCALCITON 3.92  --   --  1.15   ABG  Recent Labs Lab 11/04/14 0531  PHART 7.387  PCO2ART 29.5*  PO2ART 120.0*   Liver Enzymes  Recent Labs Lab 11/03/14 2313  11/06/14 0524 11/06/14 1645 11/07/14 0358  AST 152*  --  149*  --  131*  ALT 49  --  50  --  47  ALKPHOS 213*  --  248*  --  261*  BILITOT 4.0*  --  3.4*  --  3.6*  ALBUMIN 2.8*  < > 2.6* 2.4* 2.5*  < > = values in this interval not displayed. Cardiac Enzymes  Recent Labs Lab 11/03/14 2313 11/04/14 0500 11/04/14 1131  TROPONINI 0.61* 0.91* 1.08*   Glucose  Recent Labs Lab 11/05/14 1528 11/05/14 1934 11/05/14 2353 11/06/14 0400 11/06/14 0748 11/06/14 1154  GLUCAP 120* 137* 119* 114* 104* 112*    Imaging No results found.   ASSESSMENT /  PLAN:  PULMONARY A:  Mild pulm edema, resolved P:  Supplemental O2 as needed to maintain SpO2 > 92%  CARDIOVASCULAR A: Septic shock, resolving P:  CVP goal 8-12 MAP goal > 60 mmHg Wean NE as tolerated for MAP > 60 mmHg  RENAL A:  AKI on possible CKD,  nonoliguric Hyperkalemia, resolved Mild hyponatremia Metabolic acidosis, resolved P:  Monitor BMET intermittently Monitor I/Os Correct electrolytes as indicated Renal following, appreciate recs Per Renal furosemide 80 x1 this AM, f/u output Fluid restriction 1/2 L  GASTROINTESTINAL A: Elevated LFTs - likely alcoholic hepatitis - stable Cirrhosis by abd [place] Rectal bleeding - resolved P:  SUP: N/I Cont diet as tolerated Monitor LFTs intermittently  HEMATOLOGIC A:  Acute blood loss anemia- no further bleeding  2 units FFP, 2 units RBCs 7/03  One unit RBCs 7/06 Thrombocytopenia, mild Coagulopathy, mild - likely due to liver dz P:  DVT px: SQ heparin Monitor CBC intermittently Transfuse per ICU guidelines Cont Vitamin K  INFECTIOUS A:  Elevated PCT Severe sepsis C diff colitis P:  Micro and abx as above  ENDOCRINE A:  Mild hyperglycemia without prior dx of DM Elevated TSH, mildly low T3 P:  T4 nl, T3 mildly low to 1.7 Cont L thyroxine - initiated 7/04 Consider SSI if glu > 180  NEUROLOGIC A:  Alcohol abuse - no evidence of withdrawal P:  Monitor for withdrawal    Alyssa A. Lincoln Brigham MD, [facility] Resident PGY-1 Pager [phone]   11/07/2014, 8:48 AM  PCCM ATTENDING: I have reviewed pt's initial presentation, consultants notes and hospital database in detail.  The above assessment and plan was formulated under my direction.  In summary: Admitted with AMS, LGIB and shock with documentation of heavy EtOH Resolving septic shock due to C diff I have modified the assessment and plan above    Merton Border, MD;  PCCM service; Mobile [phone]

## 2014-11-07 NOTE — Progress Notes (Signed)
Patient ID: Austin Padilla, male   DOB: March 10, 1961, 54 y.o.   MRN: [id number]  [facility] Progress Note   Assessment/ Plan:   1. Acute renal failure possibly on chronic kidney disease: likely ATN in the setting of volume depletion with ongoing ARB/NSAIDs. Transiently on CRRT for clearance and management of hyperkalemia. Overnight with improved UOP and improving renal function.   2. C.difficile colitis: started yesterday on oral vancomycin and IV flagyl. 3. Hyponatremia: sodium levels stable/low-suspect that he has baseline hyponatremia with history of EtOhism. Restrict PO fluids to 1.2L and give an additional dose of lasix today 4. Anemia: With GI bleed and now status post PRBC, hemoglobin stable and I will check iron studies today. Give ESA if replete. 5. Shock: Suspected mixed etiology hemorrhagic/cardiogenic/septic- now on treatment narrowed to C difficile colitis.   Will sign off at this time--please re-consult as needed  Subjective:    Reports to be feeling better today and inquires about anticipated hospital course.   Objective:   BP 110/59 mmHg  Pulse 74  Temp(Src) 98 F (36.7 C) (Oral)  Resp 14  Ht 5\' 11"  (1.803 m)  Wt 116.89 kg (257 lb 11.1 oz)  BMI 35.96 kg/m2  SpO2 96%  Intake/Output Summary (Last 24 hours) at 11/07/14 0720 Last data filed at 11/07/14 0600  Gross per 24 hour  Intake 1537.07 ml  Output   2150 ml  Net -612.93 ml   Weight change: 0.69 kg (1 lb 8.3 oz)  Physical Exam: Gen: Comfortably resting in bed CVS: Regular in rate and rhythm, S1 and S2 normal  Resp: Clear to auscultation, no rales  Abd: Soft, obese, nontender  Ext: Trace ankle edema   Imaging: Dg Chest Port 1 View  11/06/2014   CLINICAL DATA:  Respiratory failure  EXAM: PORTABLE CHEST - 1 VIEW  COMPARISON:  11/05/2014  FINDINGS: Right IJ central line, tip at the distal SVC. Previously seen left IJ catheter has been removed.  Stable heart size and mediastinal contours.   Streaky opacity in the left lower lung. No edema, effusion, or pneumothorax.  IMPRESSION: 1. Focal opacity at the left base could reflect pneumonia or pneumonitis. 2. The remaining central line is in good position.   Electronically Signed   By: Monte Fantasia M.D.   On: 11/06/2014 05:24    Labs: BMET  Recent Labs Lab 11/03/14 2313  11/04/14 0500 11/04/14 1400 11/04/14 1700 11/05/14 0458 11/05/14 1400 11/06/14 0524 11/06/14 1645 11/07/14 0358  NA 121*  < > 124*  125* 129* 129* 131* 129* 129* 128* 127*  K 6.1*  < > 5.4*  5.5* 4.3 4.5 5.0 4.5 4.7 4.6 4.8  CL 88*  < > 89*  90* 94* 93* 97* 95* 93* 93* 92*  CO2 11*  --  18*  18* 20* 21* 23 21* 23 23 24   GLUCOSE 107*  < > 133*  135* 127* 137* 129* 141* 119* 108* 102*  BUN 88*  < > 70*  70* 58* 53* 42* 46* 50* 53* 55*  CREATININE 9.60*  < > 7.71*  7.77* 6.35* 5.87* 4.83* 5.08* 4.95* 4.13* 3.59*  CALCIUM 6.2*  --  6.5*  6.5* 6.3* 6.7* 7.0* 7.0* 7.3* 7.2* 7.3*  PHOS 8.8*  --  6.8*  6.8* 6.2* 5.6* 4.5 4.3  --  5.0*  --   < > = values in this interval not displayed. CBC  Recent Labs Lab 11/05/14 0458 11/05/14 1400 11/06/14 0524 11/07/14 0358  WBC 7.7  8.1 8.3 7.7  HGB 7.1* 7.0* 7.2* 7.8*  HCT 20.2* 20.6* 21.3* 22.8*  MCV 95.3 97.2 97.7 95.0  PLT 112* 103* 107* 98*    Medications:    . famotidine (PEPCID) IV  20 mg Intravenous Q12H  . levothyroxine  100 mcg Oral QAC breakfast  . metronidazole  500 mg Intravenous Q8H  . phytonadione  10 mg Oral Daily  . vancomycin  500 mg Oral 4 times per day   Elmarie Shiley, MD 11/07/2014, 7:20 AM

## 2014-11-08 DIAGNOSIS — R6520 Severe sepsis without septic shock: Secondary | ICD-10-CM

## 2014-11-08 LAB — COMPREHENSIVE METABOLIC PANEL
ALBUMIN: 2.3 g/dL — AB (ref 3.5–5.0)
ALK PHOS: 250 U/L — AB (ref 38–126)
ALT: 42 U/L (ref 17–63)
AST: 118 U/L — ABNORMAL HIGH (ref 15–41)
Anion gap: 9 (ref 5–15)
BUN: 50 mg/dL — ABNORMAL HIGH (ref 6–20)
CHLORIDE: 96 mmol/L — AB (ref 101–111)
CO2: 25 mmol/L (ref 22–32)
CREATININE: 2.11 mg/dL — AB (ref 0.61–1.24)
Calcium: 7.7 mg/dL — ABNORMAL LOW (ref 8.9–10.3)
GFR calc Af Amer: 40 mL/min — ABNORMAL LOW (ref >60)
GFR, EST NON AFRICAN AMERICAN: 34 mL/min — AB (ref >60)
Glucose, Bld: 114 mg/dL — ABNORMAL HIGH (ref 65–99)
POTASSIUM: 4.4 mmol/L (ref 3.5–5.1)
SODIUM: 130 mmol/L — AB (ref 135–145)
Total Bilirubin: 3.3 mg/dL — ABNORMAL HIGH (ref 0.3–1.2)
Total Protein: 5.8 g/dL — ABNORMAL LOW (ref 6.5–8.1)

## 2014-11-08 LAB — GI PATHOGEN PANEL BY PCR, STOOL
C difficile toxin A/B: NOT DETECTED
Campylobacter by PCR: NOT DETECTED
Cryptosporidium by PCR: NOT DETECTED
E COLI (ETEC) LT/ST: NOT DETECTED
E COLI (STEC): NOT DETECTED
E coli 0157 by PCR: NOT DETECTED
G lamblia by PCR: NOT DETECTED
NOROVIRUS G1/G2: NOT DETECTED
Rotavirus A by PCR: NOT DETECTED
SALMONELLA BY PCR: NOT DETECTED
SHIGELLA BY PCR: NOT DETECTED

## 2014-11-08 LAB — MAGNESIUM: Magnesium: 1.1 mg/dL — ABNORMAL LOW (ref 1.7–2.4)

## 2014-11-08 LAB — CBC
HCT: 22.7 % — ABNORMAL LOW (ref 39.0–52.0)
Hemoglobin: 7.7 g/dL — ABNORMAL LOW (ref 13.0–17.0)
MCH: 32.5 pg (ref 26.0–34.0)
MCHC: 33.9 g/dL (ref 30.0–36.0)
MCV: 95.8 fL (ref 78.0–100.0)
PLATELETS: 85 10*3/uL — AB (ref 150–400)
RBC: 2.37 MIL/uL — ABNORMAL LOW (ref 4.22–5.81)
RDW: 20.1 % — AB (ref 11.5–15.5)
WBC: 6.8 10*3/uL (ref 4.0–10.5)

## 2014-11-08 LAB — PHOSPHORUS: Phosphorus: 3.5 mg/dL (ref 2.5–4.6)

## 2014-11-08 MED ORDER — METRONIDAZOLE 500 MG PO TABS: 500.0000 mg | ORAL_TABLET | Freq: Two times a day (BID) | ORAL | Status: DC

## 2014-11-08 MED ORDER — VENLAFAXINE HCL ER 75 MG PO CP24
150.0000 mg | ORAL_CAPSULE | Freq: Every day | ORAL | Status: DC
  Administered 2014-11-08 – 2014-11-18 (×11): 150 mg via ORAL
  Filled 2014-11-08: qty 2
  Filled 2014-11-08: qty 1
  Filled 2014-11-08 (×8): qty 2
  Filled 2014-11-08: qty 1
  Filled 2014-11-08: qty 2

## 2014-11-08 MED ORDER — OXYCODONE HCL 5 MG PO TABS
5.0000 mg | ORAL_TABLET | ORAL | Status: DC | PRN
Start: 2014-11-08 — End: 2014-11-18
  Administered 2014-11-08 – 2014-11-18 (×55): 5 mg via ORAL
  Filled 2014-11-08 (×55): qty 1

## 2014-11-08 MED ORDER — VENLAFAXINE HCL ER 150 MG PO CP24: 150.0000 mg | ORAL_CAPSULE | Freq: Every day | ORAL | Status: DC

## 2014-11-08 MED ORDER — METRONIDAZOLE 500 MG PO TABS
500.0000 mg | ORAL_TABLET | Freq: Three times a day (TID) | ORAL | Status: DC
  Administered 2014-11-08 – 2014-11-18 (×30): 500 mg via ORAL
  Filled 2014-11-08 (×33): qty 1

## 2014-11-08 MED ORDER — METRONIDAZOLE 500 MG PO TABS: 500.0000 mg | ORAL_TABLET | Freq: Three times a day (TID) | ORAL | Status: DC

## 2014-11-08 NOTE — Progress Notes (Signed)
PULMONARY / CRITICAL CARE MEDICINE   Name: Austin Padilla MRN: [id number] DOB: 1960-08-19    ADMISSION DATE:  11/03/2014 CONSULTATION DATE:  11/03/2014  REFERRING MD :  Dr Audie Pinto of ER and Tori Milks ER resident  CHIEF COMPLAINT:  Shock , gi bleed, aTN, hyperkalemia  INITIAL PRESENTATION: 59 M alcoholic and hx of rectal abscesses presented 7/03 with 3 days of bloody diarrhea in the midst of heavy EtOH and NSAIDS consumption. Family found him weak and in stool 11/03/2014 . Found by family confused and incontinent of stool. Hypotensive in ED with AKI, acidosis, hyperkalemia  MAJOR EVENTS/TEST RESULTS: 7/04 Abd [place]: Significant sludge within gallbladder without definite shadowing calculi. Cirrhotic appearing liver with mild splenomegaly 7/04 TTE: LVEF 60-65%. Moderate MR. LA severely dilated. Diastolic dysfunction 0/97 CRRT initiated 7/05 cognition intact. Remained on high dose norepi. Remained oligo-anuric. HD catheter clotted. CRRT suspended 7/06 C diff + started on Vanc/flagyl   INDWELLING DEVICES:: R IJ HD cath 7/04 >> 7/5  MICRO DATA: MRSA PCR 7/04 >> NEG Urine 7/04 >> contaminated specimen Blood 7/04 >> NEG C diff PCR 7/06 >> POS  ANTIMICROBIALS:  Amp-sulbactam 7/04 >> 7/6 Metronidazole 7/06 >> transitioned to oral 7/8 to complete 14 day course PO vanc 7/06 >>    SUBJECTIVE:   No events overnight   VITAL SIGNS: Temp:  [97.7 F (36.5 C)-98.6 F (37 C)] 98.6 F (37 C) (07/08 0726) Pulse Rate:  [57-95] 95 (07/08 0830) Resp:  [10-19] 16 (07/08 0830) BP: (73-124)/(36-70) 104/60 mmHg (07/08 0830) SpO2:  [60 %-100 %] 100 % (07/08 0830) Weight:  [254 lb 10.1 oz (115.5 kg)] 254 lb 10.1 oz (115.5 kg) (07/08 0500) HEMODYNAMICS:   VENTILATOR SETTINGS:   INTAKE / OUTPUT:  Intake/Output Summary (Last 24 hours) at 11/08/14 0903 Last data filed at 11/08/14 0800  Gross per 24 hour  Intake 1119.45 ml  Output   2500 ml  Net -1380.55 ml    PHYSICAL  EXAMINATION: General: NAD, comfortable Neuro: No focal deficits HEENT: WNL Cardiovascular: RRR no murmurs Lungs: CTAB Abdomen: Soft, +BS Ext: warm, trace pedal edema, bilateral pedal tenderness improved Skin: bruises over bilateral legs  LABS:  CBC  Recent Labs Lab 11/06/14 0524 11/07/14 0358 11/08/14 0455  WBC 8.3 7.7 6.8  HGB 7.2* 7.8* 7.7*  HCT 21.3* 22.8* 22.7*  PLT 107* 98* 85*   Coag's  Recent Labs Lab 11/03/14 2000  11/03/14 2313 11/04/14 0500 11/05/14 0458 11/06/14 0524 11/07/14 0358  APTT  --   < > 37 37 38* 42*  --   INR 1.77*  --  1.72*  --   --   --  1.44  < > = values in this interval not displayed. BMET  Recent Labs Lab 11/06/14 1645 11/07/14 0358 11/08/14 0455  NA 128* 127* 130*  K 4.6 4.8 4.4  CL 93* 92* 96*  CO2 23 24 25   BUN 53* 55* 50*  CREATININE 4.13* 3.59* 2.11*  GLUCOSE 108* 102* 114*   Electrolytes  Recent Labs Lab 11/05/14 0458 11/05/14 1400 11/06/14 0524 11/06/14 1645 11/07/14 0358 11/08/14 0455  CALCIUM 7.0* 7.0* 7.3* 7.2* 7.3* 7.7*  MG 2.2  --  2.0  --  1.6*  --   PHOS 4.5 4.3  --  5.0*  --   --    Sepsis Markers  Recent Labs Lab 11/03/14 2313 11/03/14 2335 11/04/14 1400 11/07/14 0358  LATICACIDVEN 4.0* 3.98* 1.5  --   PROCALCITON 3.92  --   --  1.15   ABG  Recent Labs Lab 11/04/14 0531  PHART 7.387  PCO2ART 29.5*  PO2ART 120.0*   Liver Enzymes  Recent Labs Lab 11/06/14 0524 11/06/14 1645 11/07/14 0358 11/08/14 0455  AST 149*  --  131* 118*  ALT 50  --  47 42  ALKPHOS 248*  --  261* 250*  BILITOT 3.4*  --  3.6* 3.3*  ALBUMIN 2.6* 2.4* 2.5* 2.3*   Cardiac Enzymes  Recent Labs Lab 11/03/14 2313 11/04/14 0500 11/04/14 1131  TROPONINI 0.61* 0.91* 1.08*   Glucose  Recent Labs Lab 11/05/14 1528 11/05/14 1934 11/05/14 2353 11/06/14 0400 11/06/14 0748 11/06/14 1154  GLUCAP 120* 137* 119* 114* 104* 112*    Imaging No results found.   ASSESSMENT / PLAN:  PULMONARY A:   Mild pulm edema, resolved P:  Supplemental O2 as needed to maintain SpO2 > 92%  CARDIOVASCULAR A: Septic shock, resolved P:  CVP goal 8-12 MAP goal > 60 mmHg Wean NE as tolerated for MAP > 60 mmHg  RENAL A:  AKI on possible CKD,  Nonoliguric - resolving Hyperkalemia, resolved Mild hyponatremia- slowly improving Metabolic acidosis, resolved P:  Monitor BMET intermittently Monitor I/Os Correct electrolytes as indicated Renal following, appreciate recs Per Renal furosemide s/p 80 x1 yesterday Fluid restriction 1/2 L per dday  GASTROINTESTINAL A: Elevated LFTs - likely alcoholic hepatitis - stable Cirrhosis by abd [place] Rectal bleeding - resolved P:  SUP: N/I Cont diet as tolerated Monitor LFTs intermittently  HEMATOLOGIC A:  Acute blood loss anemia- no further bleeding  2 units FFP 7/03  2 units RBCs 7/03  One unit RBCs 7/06 Thrombocytopenia - stable Coagulopathy, mild - likely due to liver dz P:  DVT px: SQ heparin Monitor CBC intermittently Transfuse per usual guidelines  INFECTIOUS A:  Elevated PCT Severe sepsis C diff colitis P:  Micro and abx as above  ENDOCRINE A:  Mild hyperglycemia without prior dx of DM Elevated TSH, mildly low T3 P:  T4 nl, T3 mildly low to 1.7 Cont L thyroxine - initiated 7/04 Consider SSI if glu > 180  NEUROLOGIC A:  Alcohol abuse - no evidence of withdrawal Depression-stable P:  Monitor for withdrawal Continue home effexor   Dispo: Dispo to floor today  Alyssa A. Lincoln Brigham MD, [facility] Resident PGY-1 Pager [phone]   11/08/2014, 9:03 AM  PCCM ATTENDING: I have reviewed pt's initial presentation, consultants notes and hospital database in detail.  The above assessment and plan was formulated under my direction.  In summary: Finally off vasopressors No new complaints Cognition intact Renal function continues to improve DC IV metronidazole Complete 14 days oral  vancomycin Transfer to med-surg [facility] to assume care as of AM 7/09 and PCCM off. Discussed with Dr Marton Redwood, MD;  PCCM service; Mobile [phone]

## 2014-11-08 NOTE — Evaluation (Signed)
Physical Therapy Evaluation Patient Details Name: Austin Padilla MRN: [id number] DOB: 29-Dec-1960 Today's Date: 11/08/2014   History of Present Illness  Pt is a 54 y/o male with a history of alcohol abuse, rectal abscesses who presented with a 3 day history of bloody diarrhea in the midst of heavy ETOH and NSAID consumption. Family found him confused, weak and in stool on 11/03/14.   Clinical Impression  Pt admitted with above diagnosis. Pt currently with functional limitations due to the deficits listed below (see PT Problem List). At the time of PT eval pt was able to perform transfers with min assist for balance and safety. Pt was not safe to ambulate without an AD at this time. Will need gait training with the RW or SPC next PT session.  Pt will benefit from skilled PT to increase their independence and safety with mobility to allow discharge to the venue listed below.       Follow Up Recommendations Home health PT;Supervision/Assistance - 24 hour    Equipment Recommendations  Rolling walker with 5" wheels    Recommendations for Other Services       Precautions / Restrictions Precautions Precautions: Fall Restrictions Weight Bearing Restrictions: No      Mobility  Bed Mobility Overal bed mobility: Needs Assistance Bed Mobility: Supine to Sit     Supine to sit: Supervision     General bed mobility comments: No physical assistance required, however increased time was needed for pt to fully transition to EOB.   Transfers Overall transfer level: Needs assistance Equipment used: None Transfers: Sit to/from [facility] Sit to Stand: Min assist Stand pivot transfers: Min assist       General transfer comment: Steadying assist for balance as pt powered-up to full standing position. Pt was able to take pivotal steps around to the recliner chair but was very unsteady.   Ambulation/Gait             General Gait Details: Further ambulation was not  attempted as there was not an AD available for safety.   Stairs            Wheelchair Mobility    Modified Rankin (Stroke Patients Only)       Balance Overall balance assessment: Needs assistance Sitting-balance support: Feet supported;No upper extremity supported Sitting balance-Leahy Scale: Good     Standing balance support: No upper extremity supported;During functional activity Standing balance-Leahy Scale: Poor Standing balance comment: Pt rqeuired assist to maintain standing balance.                              Pertinent Vitals/Pain Pain Assessment: No/denies pain    Home Living Family/patient expects to be discharged to:: Private residence Living Arrangements: Alone Available Help at Discharge: Family;Friend(s);Available PRN/intermittently Type of Home: House         Home Equipment: Shower seat      Prior Function Level of Independence: Independent         Comments: Pt states he has been unsteady and holding on to walls and furniture the last couple weeks, however prior to that was completely independent.      Hand Dominance   Dominant Hand: Right    Extremity/Trunk Assessment   Upper Extremity Assessment: Overall WFL for tasks assessed           Lower Extremity Assessment: Overall WFL for tasks assessed      Cervical / Trunk Assessment: Normal  Communication   Communication: No difficulties  Cognition Arousal/Alertness: Awake/alert Behavior During Therapy: WFL for tasks assessed/performed Overall Cognitive Status: Within Functional Limits for tasks assessed                      General Comments      Exercises        Assessment/Plan    PT Assessment Patient needs continued PT services  PT Diagnosis Difficulty walking;Generalized weakness   PT Problem List Decreased strength;Decreased range of motion;Decreased activity tolerance;Decreased balance;Decreased mobility;Decreased safety awareness;Decreased  knowledge of precautions  PT Treatment Interventions DME instruction;Gait training;Functional mobility training;Stair training;Therapeutic activities;Therapeutic exercise;Neuromuscular re-education;Patient/family education   PT Goals (Current goals can be found in the Care Plan section) Acute Rehab PT Goals Patient Stated Goal: Home as soon as possible PT Goal Formulation: With patient Time For Goal Achievement: 11/15/14 Potential to Achieve Goals: Good    Frequency Min 3X/week   Barriers to discharge Decreased caregiver support Pt lives alone    Co-evaluation               End of Session   Activity Tolerance: Patient tolerated treatment well Patient left: in chair;with call bell/phone within reach;with nursing/sitter in room Nurse Communication: Mobility status         Time: 0626-9485 PT Time Calculation (min) (ACUTE ONLY): 18 min   Charges:   PT Evaluation $Initial PT Evaluation Tier I: 1 Procedure     PT G CodesRolinda Roan 2014/11/30, 11:57 AM   Rolinda Roan, PT, DPT Acute Rehabilitation Services Pager: [phone]

## 2014-11-08 NOTE — Care Management Note (Signed)
Case Management Note  Patient Details  Name: Austin Padilla MRN: [id number] Date of Birth: 07-02-1960  Subjective/Objective:  Lives at home alone, but is having a friend stay with him initially.  PT states ok for home but a little unsteady.  Patient asking for RW.  Plan for tx off to [facility] floor.                   Action/Plan:   Expected Discharge Date:                  Expected Discharge Plan:  Home/Self Care  In-House Referral:     Discharge planning Services     Post Acute Care Choice:    Choice offered to:     DME Arranged:    DME Agency:     HH Arranged:    [place] Agency:     Status of Service:  Completed, signed off  Medicare Important Message Given:    Date Medicare IM Given:    Medicare IM give by:    Date Additional Medicare IM Given:    Additional Medicare Important Message give by:     If discussed at [place] of Stay Meetings, dates discussed:    Additional Comments:  Vergie Living, RN 11/08/2014, 11:38 AM

## 2014-11-09 DIAGNOSIS — A0472 Enterocolitis due to Clostridium difficile, not specified as recurrent: Secondary | ICD-10-CM | POA: Diagnosis present

## 2014-11-09 LAB — COMPREHENSIVE METABOLIC PANEL
ALBUMIN: 2.3 g/dL — AB (ref 3.5–5.0)
ALT: 40 U/L (ref 17–63)
ANION GAP: 9 (ref 5–15)
AST: 109 U/L — ABNORMAL HIGH (ref 15–41)
Alkaline Phosphatase: 232 U/L — ABNORMAL HIGH (ref 38–126)
BILIRUBIN TOTAL: 3.7 mg/dL — AB (ref 0.3–1.2)
BUN: 39 mg/dL — AB (ref 6–20)
CO2: 25 mmol/L (ref 22–32)
CREATININE: 1.34 mg/dL — AB (ref 0.61–1.24)
Calcium: 7.7 mg/dL — ABNORMAL LOW (ref 8.9–10.3)
Chloride: 97 mmol/L — ABNORMAL LOW (ref 101–111)
GFR calc non Af Amer: 59 mL/min — ABNORMAL LOW (ref >60)
Glucose, Bld: 93 mg/dL (ref 65–99)
Potassium: 4.2 mmol/L (ref 3.5–5.1)
Sodium: 131 mmol/L — ABNORMAL LOW (ref 135–145)
Total Protein: 6 g/dL — ABNORMAL LOW (ref 6.5–8.1)

## 2014-11-09 LAB — CULTURE, BLOOD (ROUTINE X 2)
Culture: NO GROWTH
Culture: NO GROWTH

## 2014-11-09 LAB — CBC
HEMATOCRIT: 21.8 % — AB (ref 39.0–52.0)
Hemoglobin: 7.4 g/dL — ABNORMAL LOW (ref 13.0–17.0)
MCH: 33 pg (ref 26.0–34.0)
MCHC: 33.9 g/dL (ref 30.0–36.0)
MCV: 97.3 fL (ref 78.0–100.0)
Platelets: 74 10*3/uL — ABNORMAL LOW (ref 150–400)
RBC: 2.24 MIL/uL — AB (ref 4.22–5.81)
RDW: 20.2 % — AB (ref 11.5–15.5)
WBC: 6.5 10*3/uL (ref 4.0–10.5)

## 2014-11-09 MED ORDER — MAGNESIUM SULFATE 2 GM/50ML IV SOLN
2.0000 g | Freq: Once | INTRAVENOUS | Status: AC
  Administered 2014-11-09: 2 g via INTRAVENOUS
  Filled 2014-11-09: qty 50

## 2014-11-09 NOTE — Progress Notes (Signed)
Last night 11/08/14 around 2100, patient was having a nose bleed from right nare.  Patient was cleaned up; beddings changed, hospital gown changed, provided ice pack for right nare.  Bleeding stopped but then patient admitted to picking his nose and the bleeding started again in right nare around 0100.  Cleaned his face and applied ice pack; bleeding currently subsided.

## 2014-11-09 NOTE — Progress Notes (Addendum)
[facility] PROGRESS NOTE   Austin Padilla EPP:[id number] DOB: Feb 18, 1961 DOA: 11/03/2014 PCP: Joycelyn Man, MD  HPI/Subjective: Patient feels much better, still complaining about diagnosis and his's abdomen and RUQ abdominal pain. Denies any fever or chills.  Assessment/Plan: Principal Problem:   Acute renal failure Active Problems:   Alcoholic cirrhosis   Hyperkalemia   Shock circulatory   Lactic acidosis   Encounter for central line placement   Lower GI bleed    Acute renal failure Patient presented to the hospital with creatinine of 11.2, baseline creatinine 0.9 from 2 years ago. Admitted to the ICU, seen by nephrology. Start on CRRT, currently off of dialysis. Dialysis catheter removed. Renal function is improving, creatinine 1.34, currently on fluid restriction 1200 mL.  GI bleed Lower GI bleed likely, patient did have EGD done in April 2013 showed no evidence of varices. Patient came in with anemia and what is suspected to be lower GI bleed. GI was not consulted but patient appears to stop bleeding. Needs close follow-up as outpatient.  C. difficile colitis Positive for C. difficile, started on oral Flagyl and vancomycin. Currently stools firming up, likely to treat for total of 10-14 days.  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 Presented with hemoglobin of 6.2, patient baseline from 2 years ago as 13.4. Status post transfusion of 3 units of back RBCs. Hemoglobin at 7.5, follow hemoglobin closely transfuse as needed.  Alcoholic cirrhosis Patient has hypoalbuminemia, hyponatremia, thrombocytopenia, anemia and ascites. No evidence of hepatic encephalopathy. Not on diuresis because of recent acute kidney injury/failure, evaluate for diuretics on discharge.  Circulatory shock and Likely secondary to lower GI bleed and acute renal failure, this is  resolved.  Hyperkalemia Secondary to acute renal failure, resolved.  Code Status: Full Code Family Communication: Plan discussed with the patient. Disposition Plan: Remains inpatient Diet: Diet renal/carb modified with fluid restriction Diet-HS Snack?: Nothing; Room service appropriate?: Yes; Fluid consistency:: Thin; Fluid restriction:: 1200 mL Fluid  Consultants:  She was under PCCM, [facility] assumed care on 7/9  Procedures:  Insertion of CVL on 11/04/2014.  Insertion of hemodialysis access on 11/05/2014.  Transfusion of 3 units of packed RBCs on 7/3.  Transfusion of 1 unit of FFP on 7/4  Antibiotics:  Oral Flagyl and oral vancomycin for C. difficile colitis.   Objective: Filed Vitals:   11/09/14 0512  BP: 114/56  Pulse: 95  Temp: 98.6 F (37 C)  Resp: 19    Intake/Output Summary (Last 24 hours) at 11/09/14 1052 Last data filed at 11/09/14 0815  Gross per 24 hour  Intake    120 ml  Output   2025 ml  Net  -1905 ml   Filed Weights   11/06/14 0500 11/07/14 0355 11/08/14 0500  Weight: 116.2 kg (256 lb 2.8 oz) 116.89 kg (257 lb 11.1 oz) 115.5 kg (254 lb 10.1 oz)    Exam: General: Alert and awake, oriented x3, not in any acute distress. HEENT: anicteric sclera, pupils reactive to light and accommodation, EOMI CVS: S1-S2 clear, no murmur rubs or gallops Chest: clear to auscultation bilaterally, no wheezing, rales or rhonchi Abdomen: soft nontender, nondistended, normal bowel sounds, no organomegaly Extremities: no cyanosis, clubbing or edema noted bilaterally Neuro: Cranial nerves II-XII intact, no focal neurological deficits  Data Reviewed: Basic Metabolic Panel:  Recent Labs Lab 11/04/14 0500  11/04/14 1700 11/05/14 0458 11/05/14 1400 11/06/14 0524 11/06/14 1645 11/07/14 0358 11/08/14 0455 11/08/14  1020 11/09/14 0308  NA 124*  125*  < > 129* 131* 129* 129* 128* 127* 130*  --  131*  K 5.4*  5.5*  < > 4.5 5.0 4.5 4.7 4.6 4.8 4.4  --  4.2  CL 89*   90*  < > 93* 97* 95* 93* 93* 92* 96*  --  97*  CO2 18*  18*  < > 21* 23 21* 23 23 24 25   --  25  GLUCOSE 133*  135*  < > 137* 129* 141* 119* 108* 102* 114*  --  93  BUN 70*  70*  < > 53* 42* 46* 50* 53* 55* 50*  --  39*  CREATININE 7.71*  7.77*  < > 5.87* 4.83* 5.08* 4.95* 4.13* 3.59* 2.11*  --  1.34*  CALCIUM 6.5*  6.5*  < > 6.7* 7.0* 7.0* 7.3* 7.2* 7.3* 7.7*  --  7.7*  MG 1.8  --   --  2.2  --  2.0  --  1.6*  --  1.1*  --   PHOS 6.8*  6.8*  < > 5.6* 4.5 4.3  --  5.0*  --   --  3.5  --   < > = values in this interval not displayed. Liver Function Tests:  Recent Labs Lab 11/03/14 2313  11/06/14 0524 11/06/14 1645 11/07/14 0358 11/08/14 0455 11/09/14 0308  AST 152*  --  149*  --  131* 118* 109*  ALT 49  --  50  --  47 42 40  ALKPHOS 213*  --  248*  --  261* 250* 232*  BILITOT 4.0*  --  3.4*  --  3.6* 3.3* 3.7*  PROT 6.2*  --  6.5  --  6.5 5.8* 6.0*  ALBUMIN 2.8*  < > 2.6* 2.4* 2.5* 2.3* 2.3*  < > = values in this interval not displayed.  Recent Labs Lab 11/03/14 2313  LIPASE 335*  AMYLASE 79    Recent Labs Lab 11/03/14 2050 11/03/14 2313  AMMONIA 23 55*   CBC:  Recent Labs Lab 11/05/14 1400 11/06/14 0524 11/07/14 0358 11/08/14 0455 11/09/14 0308  WBC 8.1 8.3 7.7 6.8 6.5  HGB 7.0* 7.2* 7.8* 7.7* 7.4*  HCT 20.6* 21.3* 22.8* 22.7* 21.8*  MCV 97.2 97.7 95.0 95.8 97.3  PLT 103* 107* 98* 85* 74*   Cardiac Enzymes:  Recent Labs Lab 11/03/14 2313 11/04/14 0500 11/04/14 1131 11/04/14 1700  CKTOTAL  --   --   --  385  CKMB  --   --   --  7.6*  TROPONINI 0.61* 0.91* 1.08*  --    BNP (last 3 results) No results for input(s): BNP in the last 8760 hours.  ProBNP (last 3 results) No results for input(s): PROBNP in the last 8760 hours.  CBG:  Recent Labs Lab 11/05/14 1934 11/05/14 2353 11/06/14 0400 11/06/14 0748 11/06/14 1154  GLUCAP 137* 119* 114* 104* 112*    Micro Recent Results (from the past 240 hour(s))  Culture, blood (routine x 2)      Status: None (Preliminary result)   Collection Time: 11/03/14 11:13 PM  Result Value Ref Range Status   Specimen Description BLOOD RIGHT ANTECUBITAL  Final   Special Requests BOTTLES DRAWN AEROBIC AND ANAEROBIC 5CC EA  Final   Culture NO GROWTH 4 DAYS  Final   Report Status PENDING  Incomplete  Culture, blood (routine x 2)     Status: None (Preliminary result)   Collection Time: 11/03/14 11:20 PM  Result Value Ref Range Status   Specimen Description BLOOD RIGHT HAND  Final   Special Requests BOTTLES DRAWN AEROBIC ONLY 6CC  Final   Culture NO GROWTH 4 DAYS  Final   Report Status PENDING  Incomplete  MRSA PCR Screening     Status: None   Collection Time: 11/04/14 12:58 AM  Result Value Ref Range Status   MRSA by PCR NEGATIVE NEGATIVE Final    Comment:        The GeneXpert MRSA Assay (FDA approved for NASAL specimens only), is one component of a comprehensive MRSA colonization surveillance program. It is not intended to diagnose MRSA infection nor to guide or monitor treatment for MRSA infections.   Urine culture     Status: None   Collection Time: 11/04/14  2:23 AM  Result Value Ref Range Status   Specimen Description URINE, CATHETERIZED  Final   Special Requests NONE  Final   Culture   Final    MULTIPLE SPECIES PRESENT, SUGGEST RECOLLECTION IF CLINICALLY INDICATED   Report Status 11/06/2014 FINAL  Final  Clostridium Difficile by PCR (not at [facility])     Status: Abnormal   Collection Time: 11/06/14  9:00 AM  Result Value Ref Range Status   C difficile by pcr POSITIVE (A) NEGATIVE Final    Comment: CRITICAL RESULT CALLED TO, READ BACK BY AND VERIFIED WITH: Christain [place] RN 12:15 11/06/14 ([email or web address])      Studies: No results found.  Scheduled Meds: . levothyroxine  100 mcg Oral QAC breakfast  . magnesium oxide  400 mg Oral Daily  . metroNIDAZOLE  500 mg Oral 3 times per day  . vancomycin  500 mg Oral 4 times per day  . venlafaxine XR  150 mg Oral Q breakfast   Continuous  Infusions:      Time spent: 35 minutes    [facility] A  [facility] Pager [phone] If 7PM-7AM, please contact night-coverage at [email or web address], password [facility] 11/09/2014, 10:52 AM  LOS: 6 days

## 2014-11-10 LAB — COMPREHENSIVE METABOLIC PANEL
ALBUMIN: 2.3 g/dL — AB (ref 3.5–5.0)
ALT: 38 U/L (ref 17–63)
AST: 107 U/L — AB (ref 15–41)
Alkaline Phosphatase: 248 U/L — ABNORMAL HIGH (ref 38–126)
Anion gap: 10 (ref 5–15)
BUN: 26 mg/dL — ABNORMAL HIGH (ref 6–20)
CALCIUM: 8 mg/dL — AB (ref 8.9–10.3)
CO2: 25 mmol/L (ref 22–32)
Chloride: 97 mmol/L — ABNORMAL LOW (ref 101–111)
Creatinine, Ser: 0.93 mg/dL (ref 0.61–1.24)
GFR calc Af Amer: >60 mL/min (ref >60)
GFR calc non Af Amer: >60 mL/min (ref >60)
GLUCOSE: 82 mg/dL (ref 65–99)
Potassium: 4 mmol/L (ref 3.5–5.1)
Sodium: 132 mmol/L — ABNORMAL LOW (ref 135–145)
TOTAL PROTEIN: 6.2 g/dL — AB (ref 6.5–8.1)
Total Bilirubin: 4.3 mg/dL — ABNORMAL HIGH (ref 0.3–1.2)

## 2014-11-10 LAB — CBC
HCT: 21.9 % — ABNORMAL LOW (ref 39.0–52.0)
HEMOGLOBIN: 7.3 g/dL — AB (ref 13.0–17.0)
MCH: 33.6 pg (ref 26.0–34.0)
MCHC: 33.3 g/dL (ref 30.0–36.0)
MCV: 100.9 fL — ABNORMAL HIGH (ref 78.0–100.0)
PLATELETS: 76 10*3/uL — AB (ref 150–400)
RBC: 2.17 MIL/uL — ABNORMAL LOW (ref 4.22–5.81)
RDW: 20.2 % — AB (ref 11.5–15.5)
WBC: 5.6 10*3/uL (ref 4.0–10.5)

## 2014-11-10 LAB — MAGNESIUM: MAGNESIUM: 1.4 mg/dL — AB (ref 1.7–2.4)

## 2014-11-10 LAB — PREPARE RBC (CROSSMATCH)

## 2014-11-10 MED ORDER — FUROSEMIDE 10 MG/ML IJ SOLN
20.0000 mg | Freq: Once | INTRAMUSCULAR | Status: AC
  Administered 2014-11-10: 20 mg via INTRAVENOUS
  Filled 2014-11-10: qty 2

## 2014-11-10 MED ORDER — SPIRONOLACTONE 50 MG PO TABS
50.0000 mg | ORAL_TABLET | Freq: Every day | ORAL | Status: DC
  Administered 2014-11-10 – 2014-11-11 (×2): 50 mg via ORAL
  Filled 2014-11-10 (×3): qty 1

## 2014-11-10 MED ORDER — MAGNESIUM SULFATE 4 GM/100ML IV SOLN
4.0000 g | Freq: Four times a day (QID) | INTRAVENOUS | Status: AC
  Administered 2014-11-10 (×2): 4 g via INTRAVENOUS
  Filled 2014-11-10 (×2): qty 100

## 2014-11-10 MED ORDER — FUROSEMIDE 20 MG PO TABS
20.0000 mg | ORAL_TABLET | Freq: Every day | ORAL | Status: DC
  Administered 2014-11-10 – 2014-11-11 (×2): 20 mg via ORAL
  Filled 2014-11-10 (×3): qty 1

## 2014-11-10 NOTE — Progress Notes (Signed)
CSW met with pt to discuss substance abuse concerns.  Pt not interested in any help/resources at this time.  CSW will sign off.  Please re-consult as necessary.

## 2014-11-10 NOTE — Progress Notes (Signed)
[facility] PROGRESS NOTE   Austin Padilla FGH:[id number] DOB: 01/12/1961 DOA: 11/03/2014 PCP: Joycelyn Man, MD  HPI/Subjective: No changes clinically, feels okay, 1 loose bowel movement yesterday. Low magnesium. Creatinine is back to normal.  Assessment/Plan: Principal Problem:   Acute renal failure Active Problems:   Alcoholic cirrhosis   Hyperkalemia   Shock circulatory   Lactic acidosis   Acute blood loss anemia   Lower GI bleed   C. difficile colitis    Acute renal failure Patient presented to the hospital with creatinine of 11.2, baseline creatinine 0.9 from 2 years ago. Admitted to the ICU, seen by nephrology. Start on CRRT, currently off of dialysis. Dialysis catheter removed.  Creatinine is back to normal at 0.93, will start patient on low-dose of Lasix/Aldactone.  GI bleed Lower GI bleed likely, patient did have EGD done in April 2013 showed no evidence of varices. Patient came in with anemia and what is suspected to be lower GI bleed. GI was not consulted but patient appears to stop bleeding. Needs close follow-up as outpatient.  C. difficile colitis Positive for C. difficile, started on oral Flagyl and vancomycin. Currently stools firming up, likely to treat for total of 10-14 days.  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 Presented with hemoglobin of 6.2, patient baseline from 2 years ago as 13.4. Status post transfusion of 3 units of back RBCs. Hemoglobin is 7.3 today, will transfuse 1 more unit of packed RBCs.  Alcoholic cirrhosis Patient has hypoalbuminemia, hyponatremia, thrombocytopenia, anemia and ascites. No evidence of hepatic encephalopathy. Not on diuresis because of recent acute kidney injury/failure, I will start on low-dose Lasix/Aldactone.   Circulatory shock Likely secondary to lower GI bleed and acute renal failure, this is  resolved.  Hyperkalemia Secondary to acute renal failure, resolved.  Code Status: Full Code Family Communication: Plan discussed with the patient. Disposition Plan: Remains inpatient Diet: Diet renal/carb modified with fluid restriction Diet-HS Snack?: Nothing; Room service appropriate?: Yes; Fluid consistency:: Thin; Fluid restriction:: 1200 mL Fluid  Consultants:  She was under PCCM, [facility] assumed care on 7/9  Procedures:  Insertion of CVL on 11/04/2014.  Insertion of hemodialysis access on 11/05/2014.  Transfusion of 3 units of packed RBCs on 7/3.  Transfusion of 1 unit of FFP on 7/4.  Antibiotics:  Oral Flagyl and oral vancomycin for C. difficile colitis.   Objective: Filed Vitals:   11/10/14 0523  BP: 121/69  Pulse: 86  Temp: 98 F (36.7 C)  Resp: 17    Intake/Output Summary (Last 24 hours) at 11/10/14 1146 Last data filed at 11/10/14 1048  Gross per 24 hour  Intake    826 ml  Output   2300 ml  Net  -1474 ml   Filed Weights   11/06/14 0500 11/07/14 0355 11/08/14 0500  Weight: 116.2 kg (256 lb 2.8 oz) 116.89 kg (257 lb 11.1 oz) 115.5 kg (254 lb 10.1 oz)    Exam: General: Alert and awake, oriented x3, not in any acute distress. HEENT: anicteric sclera, pupils reactive to light and accommodation, EOMI CVS: S1-S2 clear, no murmur rubs or gallops Chest: clear to auscultation bilaterally, no wheezing, rales or rhonchi Abdomen: soft nontender, nondistended, normal bowel sounds, no organomegaly Extremities: no cyanosis, clubbing or edema noted bilaterally Neuro: Cranial nerves II-XII intact, no focal neurological deficits  Data Reviewed: Basic Metabolic Panel:  Recent Labs Lab 11/04/14 1700 11/05/14 0458 11/05/14 1400 11/06/14 0524 11/06/14 1645  11/07/14 0358 11/08/14 0455 11/08/14 1020 11/09/14 0308 11/10/14 0405  NA 129* 131* 129* 129* 128* 127* 130*  --  131* 132*  K 4.5 5.0 4.5 4.7 4.6 4.8 4.4  --  4.2 4.0  CL 93* 97* 95* 93* 93* 92* 96*   --  97* 97*  CO2 21* 23 21* 23 23 24 25   --  25 25  GLUCOSE 137* 129* 141* 119* 108* 102* 114*  --  93 82  BUN 53* 42* 46* 50* 53* 55* 50*  --  39* 26*  CREATININE 5.87* 4.83* 5.08* 4.95* 4.13* 3.59* 2.11*  --  1.34* 0.93  CALCIUM 6.7* 7.0* 7.0* 7.3* 7.2* 7.3* 7.7*  --  7.7* 8.0*  MG  --  2.2  --  2.0  --  1.6*  --  1.1*  --  1.4*  PHOS 5.6* 4.5 4.3  --  5.0*  --   --  3.5  --   --    Liver Function Tests:  Recent Labs Lab 11/06/14 0524 11/06/14 1645 11/07/14 0358 11/08/14 0455 11/09/14 0308 11/10/14 0405  AST 149*  --  131* 118* 109* 107*  ALT 50  --  47 42 40 38  ALKPHOS 248*  --  261* 250* 232* 248*  BILITOT 3.4*  --  3.6* 3.3* 3.7* 4.3*  PROT 6.5  --  6.5 5.8* 6.0* 6.2*  ALBUMIN 2.6* 2.4* 2.5* 2.3* 2.3* 2.3*    Recent Labs Lab 11/03/14 2313  LIPASE 335*  AMYLASE 79    Recent Labs Lab 11/03/14 2050 11/03/14 2313  AMMONIA 23 55*   CBC:  Recent Labs Lab 11/06/14 0524 11/07/14 0358 11/08/14 0455 11/09/14 0308 11/10/14 0405  WBC 8.3 7.7 6.8 6.5 5.6  HGB 7.2* 7.8* 7.7* 7.4* 7.3*  HCT 21.3* 22.8* 22.7* 21.8* 21.9*  MCV 97.7 95.0 95.8 97.3 100.9*  PLT 107* 98* 85* 74* 76*   Cardiac Enzymes:  Recent Labs Lab 11/03/14 2313 11/04/14 0500 11/04/14 1131 11/04/14 1700  CKTOTAL  --   --   --  385  CKMB  --   --   --  7.6*  TROPONINI 0.61* 0.91* 1.08*  --    BNP (last 3 results) No results for input(s): BNP in the last 8760 hours.  ProBNP (last 3 results) No results for input(s): PROBNP in the last 8760 hours.  CBG:  Recent Labs Lab 11/05/14 1934 11/05/14 2353 11/06/14 0400 11/06/14 0748 11/06/14 1154  GLUCAP 137* 119* 114* 104* 112*    Micro Recent Results (from the past 240 hour(s))  Culture, blood (routine x 2)     Status: None   Collection Time: 11/03/14 11:13 PM  Result Value Ref Range Status   Specimen Description BLOOD RIGHT ANTECUBITAL  Final   Special Requests BOTTLES DRAWN AEROBIC AND ANAEROBIC 5CC EA  Final   Culture NO GROWTH  5 DAYS  Final   Report Status 11/09/2014 FINAL  Final  Culture, blood (routine x 2)     Status: None   Collection Time: 11/03/14 11:20 PM  Result Value Ref Range Status   Specimen Description BLOOD RIGHT HAND  Final   Special Requests BOTTLES DRAWN AEROBIC ONLY [place]  Final   Culture NO GROWTH 5 DAYS  Final   Report Status 11/09/2014 FINAL  Final  MRSA PCR Screening     Status: None   Collection Time: 11/04/14 12:58 AM  Result Value Ref Range Status   MRSA by PCR NEGATIVE NEGATIVE Final  Comment:        The GeneXpert MRSA Assay (FDA approved for NASAL specimens only), is one component of a comprehensive MRSA colonization surveillance program. It is not intended to diagnose MRSA infection nor to guide or monitor treatment for MRSA infections.   Urine culture     Status: None   Collection Time: 11/04/14  2:23 AM  Result Value Ref Range Status   Specimen Description URINE, CATHETERIZED  Final   Special Requests NONE  Final   Culture   Final    MULTIPLE SPECIES PRESENT, SUGGEST RECOLLECTION IF CLINICALLY INDICATED   Report Status 11/06/2014 FINAL  Final  Clostridium Difficile by PCR (not at [facility])     Status: Abnormal   Collection Time: 11/06/14  9:00 AM  Result Value Ref Range Status   C difficile by pcr POSITIVE (A) NEGATIVE Final    Comment: CRITICAL RESULT CALLED TO, READ BACK BY AND VERIFIED WITH: Christain Sacramento RN 12:15 11/06/14 ([email or web address])      Studies: No results found.  Scheduled Meds: . levothyroxine  100 mcg Oral QAC breakfast  . magnesium oxide  400 mg Oral Daily  . magnesium sulfate 1 - 4 g bolus IVPB  4 g Intravenous Q6H  . metroNIDAZOLE  500 mg Oral 3 times per day  . vancomycin  500 mg Oral 4 times per day  . venlafaxine XR  150 mg Oral Q breakfast   Continuous Infusions:      Time spent: 35 minutes    [facility] A  [facility] Pager [phone] If 7PM-7AM, please contact night-coverage at [email or web address], password [facility] 11/10/2014, 11:46 AM  LOS: 7  days

## 2014-11-11 ENCOUNTER — Inpatient Hospital Stay (HOSPITAL_COMMUNITY): Payer: Self-pay

## 2014-11-11 LAB — CBC
HCT: 25.1 % — ABNORMAL LOW (ref 39.0–52.0)
Hemoglobin: 8.4 g/dL — ABNORMAL LOW (ref 13.0–17.0)
MCH: 32.7 pg (ref 26.0–34.0)
MCHC: 33.5 g/dL (ref 30.0–36.0)
MCV: 97.7 fL (ref 78.0–100.0)
Platelets: 97 10*3/uL — ABNORMAL LOW (ref 150–400)
RBC: 2.57 MIL/uL — ABNORMAL LOW (ref 4.22–5.81)
RDW: 19.8 % — ABNORMAL HIGH (ref 11.5–15.5)
WBC: 5.8 10*3/uL (ref 4.0–10.5)

## 2014-11-11 LAB — COMPREHENSIVE METABOLIC PANEL
ALBUMIN: 2.4 g/dL — AB (ref 3.5–5.0)
ALT: 39 U/L (ref 17–63)
ANION GAP: 8 (ref 5–15)
AST: 105 U/L — ABNORMAL HIGH (ref 15–41)
Alkaline Phosphatase: 262 U/L — ABNORMAL HIGH (ref 38–126)
BUN: 20 mg/dL (ref 6–20)
CO2: 28 mmol/L (ref 22–32)
Calcium: 8.6 mg/dL — ABNORMAL LOW (ref 8.9–10.3)
Chloride: 99 mmol/L — ABNORMAL LOW (ref 101–111)
Creatinine, Ser: 0.82 mg/dL (ref 0.61–1.24)
Glucose, Bld: 92 mg/dL (ref 65–99)
POTASSIUM: 3.9 mmol/L (ref 3.5–5.1)
SODIUM: 135 mmol/L (ref 135–145)
Total Bilirubin: 5.5 mg/dL — ABNORMAL HIGH (ref 0.3–1.2)
Total Protein: 6.4 g/dL — ABNORMAL LOW (ref 6.5–8.1)

## 2014-11-11 LAB — TYPE AND SCREEN
ABO/RH(D): B POS
Antibody Screen: NEGATIVE
UNIT DIVISION: 0

## 2014-11-11 LAB — MAGNESIUM: MAGNESIUM: 2 mg/dL (ref 1.7–2.4)

## 2014-11-11 MED ORDER — SACCHAROMYCES BOULARDII 250 MG PO CAPS
250.0000 mg | ORAL_CAPSULE | Freq: Two times a day (BID) | ORAL | Status: DC
  Administered 2014-11-11 – 2014-11-18 (×15): 250 mg via ORAL
  Filled 2014-11-11 (×19): qty 1

## 2014-11-11 MED ORDER — IOHEXOL 300 MG/ML  SOLN
25.0000 mL | INTRAMUSCULAR | Status: AC
  Administered 2014-11-11 (×2): 25 mL via ORAL

## 2014-11-11 MED ORDER — IOHEXOL 300 MG/ML  SOLN
100.0000 mL | Freq: Once | INTRAMUSCULAR | Status: AC | PRN
  Administered 2014-11-11: 100 mL via INTRAVENOUS

## 2014-11-11 NOTE — Progress Notes (Signed)
[facility] PROGRESS NOTE   TALLON GERTZ PYK:[id number] DOB: 11/04/1960 DOA: 11/03/2014 PCP: Joycelyn Man, MD  HPI/Subjective: Has 1 small bowel movement earlier today, there is some abdominal distention. Abdominal x-ray shows generalized ileus, obtain CT scan of abdomen pelvis. Ambulate every 4 hours.  Assessment/Plan: Principal Problem:   Acute renal failure Active Problems:   Alcoholic cirrhosis   Hyperkalemia   Shock circulatory   Lactic acidosis   Acute blood loss anemia   Lower GI bleed   C. difficile colitis    Acute renal failure Patient presented to the hospital with creatinine of 11.2, baseline creatinine 0.9 from 2 years ago. Admitted to the ICU, seen by nephrology. Start on CRRT, currently off of dialysis. Dialysis catheter removed.  Creatinine is back to normal at 0.93, will start patient on low-dose of Lasix/Aldactone.  GI bleed Lower GI bleed likely, patient did have EGD done in April 2013 showed no evidence of varices. Patient came in with anemia and what is suspected to be lower GI bleed. GI was not consulted but patient appears to stop bleeding. Needs close follow-up as outpatient.  C. difficile colitis Positive for C. difficile, started on oral Flagyl and vancomycin. Currently stools firming up, likely to treat for total of 10-14 days.  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 Presented with hemoglobin of 6.2, patient baseline from 2 years ago as 13.4. Status post transfusion of 3 units of back RBCs. Hemoglobin is 7.3 today, will transfuse 1 more unit of packed RBCs.  Alcoholic cirrhosis Patient has hypoalbuminemia, hyponatremia, thrombocytopenia, anemia and ascites. No evidence of hepatic encephalopathy. Not on diuresis because of recent acute kidney injury/failure, I will start on low-dose Lasix/Aldactone.   Elevated troponin On admission cardiac  enzymes went up to 1.08, patient was hypotensive and had shock then. This is thought secondary to demand ischemia because of circulatory shock, also acute renal failure. 2-D echo showed no wall motion abnormality.  Circulatory shock Likely secondary to lower GI bleed and acute renal failure, this is resolved.  Hyperkalemia Secondary to acute renal failure, resolved.  Code Status: Full Code Family Communication: Plan discussed with the patient. Disposition Plan: Remains inpatient Diet: Diet renal/carb modified with fluid restriction Diet-HS Snack?: Nothing; Room service appropriate?: Yes; Fluid consistency:: Thin; Fluid restriction:: 1200 mL Fluid  Consultants:  She was under PCCM, [facility] assumed care on 7/9  Procedures:  Insertion of CVL on 11/04/2014.  Insertion of hemodialysis access on 11/05/2014.  Transfusion of 3 units of packed RBCs on 7/3.  Transfusion of 1 unit of FFP on 7/4.  Antibiotics:  Oral Flagyl and oral vancomycin for C. difficile colitis.   Objective: Filed Vitals:   11/11/14 0601  BP: 133/74  Pulse: 82  Temp: 98.5 F (36.9 C)  Resp: 20    Intake/Output Summary (Last 24 hours) at 11/11/14 1342 Last data filed at 11/11/14 1237  Gross per 24 hour  Intake 1879.5 ml  Output   2900 ml  Net -1020.5 ml   Filed Weights   11/06/14 0500 11/07/14 0355 11/08/14 0500  Weight: 116.2 kg (256 lb 2.8 oz) 116.89 kg (257 lb 11.1 oz) 115.5 kg (254 lb 10.1 oz)    Exam: General: Alert and awake, oriented x3, not in any acute distress. HEENT: anicteric sclera, pupils reactive to light and accommodation, EOMI CVS: S1-S2 clear, no murmur rubs or gallops Chest: clear to auscultation bilaterally, no wheezing, rales or rhonchi  Abdomen: soft nontender, nondistended, normal bowel sounds, no organomegaly Extremities: no cyanosis, clubbing or edema noted bilaterally Neuro: Cranial nerves II-XII intact, no focal neurological deficits  Data Reviewed: Basic Metabolic  Panel:  Recent Labs Lab 11/04/14 1700  11/05/14 0458 11/05/14 1400 11/06/14 0524 11/06/14 1645 11/07/14 0358 11/08/14 0455 11/08/14 1020 11/09/14 0308 11/10/14 0405 11/11/14 0424  NA 129*  --  131* 129* 129* 128* 127* 130*  --  131* 132* 135  K 4.5  --  5.0 4.5 4.7 4.6 4.8 4.4  --  4.2 4.0 3.9  CL 93*  --  97* 95* 93* 93* 92* 96*  --  97* 97* 99*  CO2 21*  --  23 21* 23 23 24 25   --  25 25 28   GLUCOSE 137*  --  129* 141* 119* 108* 102* 114*  --  93 82 92  BUN 53*  --  42* 46* 50* 53* 55* 50*  --  39* 26* 20  CREATININE 5.87*  --  4.83* 5.08* 4.95* 4.13* 3.59* 2.11*  --  1.34* 0.93 0.82  CALCIUM 6.7*  --  7.0* 7.0* 7.3* 7.2* 7.3* 7.7*  --  7.7* 8.0* 8.6*  MG  --   < > 2.2  --  2.0  --  1.6*  --  1.1*  --  1.4* 2.0  PHOS 5.6*  --  4.5 4.3  --  5.0*  --   --  3.5  --   --   --   < > = values in this interval not displayed. Liver Function Tests:  Recent Labs Lab 11/07/14 0358 11/08/14 0455 11/09/14 0308 11/10/14 0405 11/11/14 0424  AST 131* 118* 109* 107* 105*  ALT 47 42 40 38 39  ALKPHOS 261* 250* 232* 248* 262*  BILITOT 3.6* 3.3* 3.7* 4.3* 5.5*  PROT 6.5 5.8* 6.0* 6.2* 6.4*  ALBUMIN 2.5* 2.3* 2.3* 2.3* 2.4*   No results for input(s): LIPASE, AMYLASE in the last 168 hours. No results for input(s): AMMONIA in the last 168 hours. CBC:  Recent Labs Lab 11/07/14 0358 11/08/14 0455 11/09/14 0308 11/10/14 0405 11/11/14 1200  WBC 7.7 6.8 6.5 5.6 5.8  HGB 7.8* 7.7* 7.4* 7.3* 8.4*  HCT 22.8* 22.7* 21.8* 21.9* 25.1*  MCV 95.0 95.8 97.3 100.9* 97.7  PLT 98* 85* 74* 76* 97*   Cardiac Enzymes:  Recent Labs Lab 11/04/14 1700  CKTOTAL 385  CKMB 7.6*   BNP (last 3 results) No results for input(s): BNP in the last 8760 hours.  ProBNP (last 3 results) No results for input(s): PROBNP in the last 8760 hours.  CBG:  Recent Labs Lab 11/05/14 1934 11/05/14 2353 11/06/14 0400 11/06/14 0748 11/06/14 1154  GLUCAP 137* 119* 114* 104* 112*    Micro Recent  Results (from the past 240 hour(s))  Culture, blood (routine x 2)     Status: None   Collection Time: 11/03/14 11:13 PM  Result Value Ref Range Status   Specimen Description BLOOD RIGHT ANTECUBITAL  Final   Special Requests BOTTLES DRAWN AEROBIC AND ANAEROBIC 5CC EA  Final   Culture NO GROWTH 5 DAYS  Final   Report Status 11/09/2014 FINAL  Final  Culture, blood (routine x 2)     Status: None   Collection Time: 11/03/14 11:20 PM  Result Value Ref Range Status   Specimen Description BLOOD RIGHT HAND  Final   Special Requests BOTTLES DRAWN AEROBIC ONLY [place]  Final   Culture NO GROWTH 5 DAYS  Final  Report Status 11/09/2014 FINAL  Final  MRSA PCR Screening     Status: None   Collection Time: 11/04/14 12:58 AM  Result Value Ref Range Status   MRSA by PCR NEGATIVE NEGATIVE Final    Comment:        The GeneXpert MRSA Assay (FDA approved for NASAL specimens only), is one component of a comprehensive MRSA colonization surveillance program. It is not intended to diagnose MRSA infection nor to guide or monitor treatment for MRSA infections.   Urine culture     Status: None   Collection Time: 11/04/14  2:23 AM  Result Value Ref Range Status   Specimen Description URINE, CATHETERIZED  Final   Special Requests NONE  Final   Culture   Final    MULTIPLE SPECIES PRESENT, SUGGEST RECOLLECTION IF CLINICALLY INDICATED   Report Status 11/06/2014 FINAL  Final  Clostridium Difficile by PCR (not at [facility])     Status: Abnormal   Collection Time: 11/06/14  9:00 AM  Result Value Ref Range Status   C difficile by pcr POSITIVE (A) NEGATIVE Final    Comment: CRITICAL RESULT CALLED TO, READ BACK BY AND VERIFIED WITH: Christain Sacramento RN 12:15 11/06/14 ([email or web address])      Studies: Dg Abd 1 View  11/11/2014   CLINICAL DATA:  Abdominal distention and discomfort, history of cirrhosis, acute renal failure, blood loss anemia, and C difficile colitis.  EXAM: ABDOMEN - 1 VIEW  COMPARISON:  None.  FINDINGS: There is  moderate gaseous distention of small and large bowel loops. A small amount of gas is present in the rectum. There is some stool in the right colon. No abnormal soft tissue calcifications are observed. The bony structures exhibit no acute abnormalities.  IMPRESSION: Bowel gas pattern most compatible with a generalized ileus. No definite obstructive pattern is observed. A three-way abdominal series or abdominal and pelvic CT scan would be useful for further evaluation.   Electronically Signed   By: David  Martinique M.D.   On: 11/11/2014 10:08    Scheduled Meds: . furosemide  20 mg Oral Daily  . levothyroxine  100 mcg Oral QAC breakfast  . magnesium oxide  400 mg Oral Daily  . metroNIDAZOLE  500 mg Oral 3 times per day  . saccharomyces boulardii  250 mg Oral BID  . spironolactone  50 mg Oral Daily  . vancomycin  500 mg Oral 4 times per day  . venlafaxine XR  150 mg Oral Q breakfast   Continuous Infusions:      Time spent: 35 minutes    [facility] A  [facility] Pager [phone] If 7PM-7AM, please contact night-coverage at [email or web address], password [facility] 11/11/2014, 1:42 PM  LOS: 8 days

## 2014-11-11 NOTE — Progress Notes (Signed)
Physical Therapy Treatment Patient Details Name: Austin Padilla MRN: [id number] DOB: 1960/11/17 Today's Date: 11/11/2014    History of Present Illness Pt is a 54 y/o male with a history of alcohol abuse, rectal abscesses who presented with a 3 day history of bloody diarrhea in the midst of heavy ETOH and NSAID consumption. Family found him confused, weak and in stool on 11/03/14.     PT Comments    Pt ambulated in room w/ RW demonstrating min instability.  Dec ambulatory endurance which pt reports is his baseline.  Will have assist upon d/c home to navigate steps using hand held assist as Austin Padilla does not have railings along his stairs.  Pt will benefit from continued skilled PT services to increase functional independence and safety and to continue to practice ambulation w/ RW.   Follow Up Recommendations  Home health PT;Supervision/Assistance - 24 hour     Equipment Recommendations  Rolling walker with 5" wheels    Recommendations for Other Services       Precautions / Restrictions Precautions Precautions: Fall Restrictions Weight Bearing Restrictions: No    Mobility  Bed Mobility Overal bed mobility: Needs Assistance Bed Mobility: Supine to Sit     Supine to sit: Supervision     General bed mobility comments: No physical assist required, increased time and unsteady transition.  Transfers Overall transfer level: Needs assistance Equipment used: Rolling walker (2 wheeled) Transfers: Sit to/from Stand Sit to Stand: Min guard         General transfer comment: Min guard 2/2 pt's mild instability.  Cues for proper use of RW and hand placement during sit<>stand.  Pt keeps hands on RW during stand>sit rather than reaching back for armrests.  Ambulation/Gait Ambulation/Gait assistance: Min guard Ambulation Distance (Feet): 30 Feet Assistive device: Rolling walker (2 wheeled) Gait Pattern/deviations: Step-through pattern;Antalgic;Trunk flexed;Narrow base of  support   Gait velocity interpretation: Below normal speed for age/gender General Gait Details: Cues for use of RW, pt WB through BUEs to maintain balance.  Mild instability and dec endurance as pt requests to ambulate in room only 2/2 fatigue.   Stairs            Wheelchair Mobility    Modified Rankin (Stroke Patients Only)       Balance Overall balance assessment: Needs assistance Sitting-balance support: No upper extremity supported;Feet supported Sitting balance-Leahy Scale: Good     Standing balance support: No upper extremity supported;During functional activity Standing balance-Leahy Scale: Poor Standing balance comment: Pt able to stand w/o assist donning new gown standing EOB; however mod instability noted                    Cognition Arousal/Alertness: Awake/alert Behavior During Therapy: WFL for tasks assessed/performed Overall Cognitive Status: Within Functional Limits for tasks assessed                      Exercises General Exercises - Lower Extremity Ankle Circles/Pumps: AROM;Both;15 reps;Supine    General Comments        Pertinent Vitals/Pain Pain Assessment: No/denies pain    Home Living                      Prior Function            PT Goals (current goals can now be found in the care plan section) Acute Rehab PT Goals PT Goal Formulation: With patient Time For Goal Achievement: 11/15/14 Potential to  Achieve Goals: Good Progress towards PT goals: Progressing toward goals    Frequency  Min 3X/week    PT Plan Current plan remains appropriate    Co-evaluation             End of Session   Activity Tolerance: Patient tolerated treatment well Patient left: in chair;with call bell/phone within reach     Time: 1152-1215 PT Time Calculation (min) (ACUTE ONLY): 23 min  Charges:  $Gait Training: 8-22 mins $Therapeutic Activity: 8-22 mins                    G Codes:      Joslyn Hy PT, [place]  [phone] Pager: [phone] 11/11/2014, 1:23 PM

## 2014-11-12 LAB — COMPREHENSIVE METABOLIC PANEL
ALBUMIN: 2.3 g/dL — AB (ref 3.5–5.0)
ALT: 37 U/L (ref 17–63)
ANION GAP: 10 (ref 5–15)
AST: 98 U/L — ABNORMAL HIGH (ref 15–41)
Alkaline Phosphatase: 244 U/L — ABNORMAL HIGH (ref 38–126)
BUN: 16 mg/dL (ref 6–20)
CO2: 24 mmol/L (ref 22–32)
Calcium: 8.5 mg/dL — ABNORMAL LOW (ref 8.9–10.3)
Chloride: 99 mmol/L — ABNORMAL LOW (ref 101–111)
Creatinine, Ser: 0.79 mg/dL (ref 0.61–1.24)
GFR calc non Af Amer: >60 mL/min (ref >60)
Glucose, Bld: 89 mg/dL (ref 65–99)
POTASSIUM: 4 mmol/L (ref 3.5–5.1)
Sodium: 133 mmol/L — ABNORMAL LOW (ref 135–145)
Total Bilirubin: 4.4 mg/dL — ABNORMAL HIGH (ref 0.3–1.2)
Total Protein: 6.2 g/dL — ABNORMAL LOW (ref 6.5–8.1)

## 2014-11-12 LAB — CBC
HEMATOCRIT: 25 % — AB (ref 39.0–52.0)
Hemoglobin: 8.3 g/dL — ABNORMAL LOW (ref 13.0–17.0)
MCH: 32.5 pg (ref 26.0–34.0)
MCHC: 33.2 g/dL (ref 30.0–36.0)
MCV: 98 fL (ref 78.0–100.0)
Platelets: 102 10*3/uL — ABNORMAL LOW (ref 150–400)
RBC: 2.55 MIL/uL — ABNORMAL LOW (ref 4.22–5.81)
RDW: 19.2 % — AB (ref 11.5–15.5)
WBC: 5.5 10*3/uL (ref 4.0–10.5)

## 2014-11-12 MED ORDER — ONDANSETRON HCL 4 MG/2ML IJ SOLN
4.0000 mg | Freq: Four times a day (QID) | INTRAMUSCULAR | Status: DC | PRN
  Administered 2014-11-12: 4 mg via INTRAVENOUS
  Filled 2014-11-12: qty 2

## 2014-11-12 MED ORDER — LACTULOSE 10 GM/15ML PO SOLN
30.0000 g | Freq: Three times a day (TID) | ORAL | Status: DC
  Administered 2014-11-12 (×3): 30 g via ORAL
  Filled 2014-11-12 (×4): qty 45

## 2014-11-12 MED ORDER — FUROSEMIDE 40 MG PO TABS
40.0000 mg | ORAL_TABLET | Freq: Every day | ORAL | Status: DC
  Administered 2014-11-12 – 2014-11-18 (×7): 40 mg via ORAL
  Filled 2014-11-12 (×7): qty 1

## 2014-11-12 MED ORDER — SPIRONOLACTONE 100 MG PO TABS
100.0000 mg | ORAL_TABLET | Freq: Every day | ORAL | Status: DC
  Administered 2014-11-12 – 2014-11-18 (×7): 100 mg via ORAL
  Filled 2014-11-12 (×7): qty 1

## 2014-11-12 NOTE — Progress Notes (Signed)
Had one bloody stool at 0545. Stool flushed before I was able to see it. Patient described bowel movement as clear content with bright red blood. Blood seen running on leg and on the bathroom. Denies dizziness or shortness of breath. MD paged and awaiting response.

## 2014-11-12 NOTE — Progress Notes (Signed)
Physical Therapy Treatment Patient Details Name: Austin Padilla MRN: [id number] DOB: 04-Jun-1960 Today's Date: 11/12/2014    History of Present Illness Pt is a 54 y/o male with a history of alcohol abuse, rectal abscesses who presented with a 3 day history of bloody diarrhea in the midst of heavy ETOH and NSAID consumption. Family found him confused, weak and in stool on 11/03/14.     PT Comments    Pt with improved ambulation tolerance however con't to remain deconditioned and weak. Pt limited by onset of LE weakness, "feeling of giving out." Acute PT to con't to follow to progress mobility as able.  Follow Up Recommendations  Home health PT;Supervision/Assistance - 24 hour     Equipment Recommendations  Rolling walker with 5" wheels    Recommendations for Other Services       Precautions / Restrictions Precautions Precautions: Fall Restrictions Weight Bearing Restrictions: No    Mobility  Bed Mobility Overal bed mobility: Needs Assistance Bed Mobility: Supine to Sit;Sit to Supine     Supine to sit: Supervision Sit to supine: Supervision   General bed mobility comments: v/c's for log roll technique  Transfers Overall transfer level: Needs assistance Equipment used: Rolling walker (2 wheeled) Transfers: Sit to/from Stand Sit to Stand: Min guard         General transfer comment: v/c's for safe hand placemet  Ambulation/Gait Ambulation/Gait assistance: Min guard Ambulation Distance (Feet): 50 Feet (x2, 1 seated rest break x 2 min) Assistive device: Rolling walker (2 wheeled) Gait Pattern/deviations: Step-through pattern;Decreased stride length (increased UE WBing)   Gait velocity interpretation: Below normal speed for age/gender General Gait Details: onset of LE fatigue requiring seated rest break   Stairs            Wheelchair Mobility    Modified Rankin (Stroke Patients Only)       Balance           Standing balance support: No upper  extremity supported Standing balance-Leahy Scale: Poor                      Cognition Arousal/Alertness: Awake/alert Behavior During Therapy: WFL for tasks assessed/performed Overall Cognitive Status: Within Functional Limits for tasks assessed                      Exercises      General Comments General comments (skin integrity, edema, etc.): pt attempted to urinate but unable. Pt encouraged to stand and urinate in urinal or toliet however pt reports of onset of bilat calf pain and refused and attempted to urinte supine in bed.      Pertinent Vitals/Pain Pain Assessment: No/denies pain (c/o nausea)    Home Living                      Prior Function            PT Goals (current goals can now be found in the care plan section) Acute Rehab PT Goals Patient Stated Goal: home Progress towards PT goals: Progressing toward goals    Frequency  Min 3X/week    PT Plan Current plan remains appropriate    Co-evaluation             End of Session Equipment Utilized During Treatment: Gait belt Activity Tolerance: Patient tolerated treatment well Patient left: in chair;with call bell/phone within reach     Time: 8563-1497 PT Time Calculation (min) (ACUTE ONLY):  21 min  Charges:  $Gait Training: 8-22 mins                    G Codes:      Kingsley Callander 11/12/2014, 3:54 PM   Kittie Plater, PT, DPT Pager #: [phone] Office #: [phone]

## 2014-11-12 NOTE — Progress Notes (Signed)
[facility] PROGRESS NOTE   Austin Padilla:[id number] DOB: 02/02/1961 DOA: 11/03/2014 PCP: Joycelyn Man, MD  HPI/Subjective: Patient had bowel movement this morning was some blood around it. Likely bleeding is hemorrhoidal. CT scan of abdomen showed moderate stool burden, started on lactulose. I have asked patient to walk in the hallway  Barriers to discharge: Patient probably can be discharged in a.m. if bowel movements improved and got regular.  Assessment/Plan: Principal Problem:   Acute renal failure Active Problems:   Alcoholic cirrhosis   Hyperkalemia   Shock circulatory   Lactic acidosis   Acute blood loss anemia   Lower GI bleed   C. difficile colitis    Acute renal failure Patient presented to the hospital with creatinine of 11.2, baseline creatinine 0.9 from 2 years ago. Admitted to the ICU, seen by nephrology. Start on CRRT, currently off of dialysis. Dialysis catheter removed.  Creatinine is back to normal at 0.93, will start patient on low-dose of Lasix/Aldactone.  Abdominal distention Patient since yesterday has some abdominal distention, KUB was done and showed possible ileus. CT scan of abdomen was done showed moderate stool burden, start patient on lactulose.  I have asked patient to walk in the hallways to help with bowel movements.  he had bowel movement earlier today.   GI bleed Lower GI bleed likely, patient did have EGD done in April 2013 showed no evidence of varices. Patient came in with anemia and what is suspected to be lower GI bleed. GI was not consulted but patient appears to stop bleeding. Needs close follow-up as outpatient.  C. difficile colitis Positive for C. difficile, started on oral Flagyl and vancomycin. Currently stools firming up, likely to treat for total of 10-14 days.  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 Presented with  hemoglobin of 6.2, patient baseline from 2 years ago as 13.4. Status post transfusion of 3 units of back RBCs. Hemoglobin is 7.3 today, will transfuse 1 more unit of packed RBCs.  Alcoholic cirrhosis Patient has hypoalbuminemia, hyponatremia, thrombocytopenia, anemia and ascites. No evidence of hepatic encephalopathy. Not on diuresis because of recent acute kidney injury/failure, I will start on low-dose Lasix/Aldactone.   Elevated troponin On admission cardiac enzymes went up to 1.08, patient was hypotensive and had shock then. This is thought secondary to demand ischemia because of circulatory shock, also acute renal failure. 2-D echo showed no wall motion abnormality.  Circulatory shock Likely secondary to lower GI bleed and acute renal failure, this is resolved.  Hyperkalemia Secondary to acute renal failure, resolved.  Code Status: Full Code Family Communication: Plan discussed with the patient. Disposition Plan: Remains inpatient Diet: Diet renal/carb modified with fluid restriction Diet-HS Snack?: Nothing; Room service appropriate?: Yes; Fluid consistency:: Thin; Fluid restriction:: 1200 mL Fluid  Consultants:  She was under PCCM, [facility] assumed care on 7/9  Procedures:  Insertion of CVL on 11/04/2014.  Insertion of hemodialysis access on 11/05/2014.  Transfusion of 3 units of packed RBCs on 7/3.  Transfusion of 1 unit of FFP on 7/4.  Antibiotics:  Oral Flagyl and oral vancomycin for C. difficile colitis.   Objective: Filed Vitals:   11/12/14 0507  BP: 142/69  Pulse: 109  Temp: 98 F (36.7 C)  Resp: 16    Intake/Output Summary (Last 24 hours) at 11/12/14 1416 Last data filed at 11/12/14 0900  Gross per 24 hour  Intake    720 ml  Output  1025 ml  Net   -305 ml   Filed Weights   11/06/14 0500 11/07/14 0355 11/08/14 0500  Weight: 116.2 kg (256 lb 2.8 oz) 116.89 kg (257 lb 11.1 oz) 115.5 kg (254 lb 10.1 oz)    Exam: General: Alert and awake,  oriented x3, not in any acute distress. HEENT: anicteric sclera, pupils reactive to light and accommodation, EOMI CVS: S1-S2 clear, no murmur rubs or gallops Chest: clear to auscultation bilaterally, no wheezing, rales or rhonchi Abdomen: soft nontender, nondistended, normal bowel sounds, no organomegaly Extremities: no cyanosis, clubbing or edema noted bilaterally Neuro: Cranial nerves II-XII intact, no focal neurological deficits  Data Reviewed: Basic Metabolic Panel:  Recent Labs Lab 11/06/14 0524 11/06/14 1645 11/07/14 0358 11/08/14 0455 11/08/14 1020 11/09/14 0308 11/10/14 0405 11/11/14 0424 11/12/14 0327  NA 129* 128* 127* 130*  --  131* 132* 135 133*  K 4.7 4.6 4.8 4.4  --  4.2 4.0 3.9 4.0  CL 93* 93* 92* 96*  --  97* 97* 99* 99*  CO2 23 23 24 25   --  25 25 28 24   GLUCOSE 119* 108* 102* 114*  --  93 82 92 89  BUN 50* 53* 55* 50*  --  39* 26* 20 16  CREATININE 4.95* 4.13* 3.59* 2.11*  --  1.34* 0.93 0.82 0.79  CALCIUM 7.3* 7.2* 7.3* 7.7*  --  7.7* 8.0* 8.6* 8.5*  MG 2.0  --  1.6*  --  1.1*  --  1.4* 2.0  --   PHOS  --  5.0*  --   --  3.5  --   --   --   --    Liver Function Tests:  Recent Labs Lab 11/08/14 0455 11/09/14 0308 11/10/14 0405 11/11/14 0424 11/12/14 0327  AST 118* 109* 107* 105* 98*  ALT 42 40 38 39 37  ALKPHOS 250* 232* 248* 262* 244*  BILITOT 3.3* 3.7* 4.3* 5.5* 4.4*  PROT 5.8* 6.0* 6.2* 6.4* 6.2*  ALBUMIN 2.3* 2.3* 2.3* 2.4* 2.3*   No results for input(s): LIPASE, AMYLASE in the last 168 hours. No results for input(s): AMMONIA in the last 168 hours. CBC:  Recent Labs Lab 11/08/14 0455 11/09/14 0308 11/10/14 0405 11/11/14 1200 11/12/14 0923  WBC 6.8 6.5 5.6 5.8 5.5  HGB 7.7* 7.4* 7.3* 8.4* 8.3*  HCT 22.7* 21.8* 21.9* 25.1* 25.0*  MCV 95.8 97.3 100.9* 97.7 98.0  PLT 85* 74* 76* 97* 102*   Cardiac Enzymes: No results for input(s): CKTOTAL, CKMB, CKMBINDEX, TROPONINI in the last 168 hours. BNP (last 3 results) No results for  input(s): BNP in the last 8760 hours.  ProBNP (last 3 results) No results for input(s): PROBNP in the last 8760 hours.  CBG:  Recent Labs Lab 11/05/14 1934 11/05/14 2353 11/06/14 0400 11/06/14 0748 11/06/14 1154  GLUCAP 137* 119* 114* 104* 112*    Micro Recent Results (from the past 240 hour(s))  Culture, blood (routine x 2)     Status: None   Collection Time: 11/03/14 11:13 PM  Result Value Ref Range Status   Specimen Description BLOOD RIGHT ANTECUBITAL  Final   Special Requests BOTTLES DRAWN AEROBIC AND ANAEROBIC 5CC EA  Final   Culture NO GROWTH 5 DAYS  Final   Report Status 11/09/2014 FINAL  Final  Culture, blood (routine x 2)     Status: None   Collection Time: 11/03/14 11:20 PM  Result Value Ref Range Status   Specimen Description BLOOD RIGHT HAND  Final  Special Requests BOTTLES DRAWN AEROBIC ONLY 6CC  Final   Culture NO GROWTH 5 DAYS  Final   Report Status 11/09/2014 FINAL  Final  MRSA PCR Screening     Status: None   Collection Time: 11/04/14 12:58 AM  Result Value Ref Range Status   MRSA by PCR NEGATIVE NEGATIVE Final    Comment:        The GeneXpert MRSA Assay (FDA approved for NASAL specimens only), is one component of a comprehensive MRSA colonization surveillance program. It is not intended to diagnose MRSA infection nor to guide or monitor treatment for MRSA infections.   Urine culture     Status: None   Collection Time: 11/04/14  2:23 AM  Result Value Ref Range Status   Specimen Description URINE, CATHETERIZED  Final   Special Requests NONE  Final   Culture   Final    MULTIPLE SPECIES PRESENT, SUGGEST RECOLLECTION IF CLINICALLY INDICATED   Report Status 11/06/2014 FINAL  Final  Clostridium Difficile by PCR (not at [facility])     Status: Abnormal   Collection Time: 11/06/14  9:00 AM  Result Value Ref Range Status   C difficile by pcr POSITIVE (A) NEGATIVE Final    Comment: CRITICAL RESULT CALLED TO, READ BACK BY AND VERIFIED WITH: Christain Sacramento RN  12:15 11/06/14 ([email or web address])      Studies: Dg Abd 1 View  11/11/2014   CLINICAL DATA:  Abdominal distention and discomfort, history of cirrhosis, acute renal failure, blood loss anemia, and C difficile colitis.  EXAM: ABDOMEN - 1 VIEW  COMPARISON:  None.  FINDINGS: There is moderate gaseous distention of small and large bowel loops. A small amount of gas is present in the rectum. There is some stool in the right colon. No abnormal soft tissue calcifications are observed. The bony structures exhibit no acute abnormalities.  IMPRESSION: Bowel gas pattern most compatible with a generalized ileus. No definite obstructive pattern is observed. A three-way abdominal series or abdominal and pelvic CT scan would be useful for further evaluation.   Electronically Signed   By: David  Martinique M.D.   On: 11/11/2014 10:08   Ct Abdomen Pelvis W Contrast  11/11/2014   CLINICAL DATA:  54 year old male admitted with gastrointestinal bleeding and evidence of ileus on plain film.  EXAM: CT ABDOMEN AND PELVIS WITH CONTRAST  TECHNIQUE: Multidetector CT imaging of the abdomen and pelvis was performed using the standard protocol following bolus administration of intravenous contrast.  CONTRAST:  171mL OMNIPAQUE IOHEXOL 300 MG/ML  SOLN  COMPARISON:  CT 08/09/2011, plain film 11/11/2014  FINDINGS: Lower chest:  Unremarkable appearance of the soft tissues of the chest wall.  Heart size within normal limits.  No pericardial fluid/thickening.  No lower mediastinal adenopathy.  Unremarkable appearance of the distal esophagus.  Small hiatal hernia.  No confluent airspace disease, pleural fluid, or pneumothorax within visualized lung.  Abdomen/pelvis:  Cirrhotic changes of the liver. Cranial caudal span of the liver measures 22 cm. Cranial caudal span of the spleen measures 16 cm.  Unremarkable bilateral adrenal glands.  Unremarkable pancreas.  Radiopaque gallstones within the gallbladder. No gallbladder wall thickening. There is adjacent fluid  though this is likely related to ascites.  Free fluid throughout the abdomen, most pronounced within the bilateral pericolic gutter, perisplenic region,. Pack region, and dependently within the pelvis.  Haziness of the mesenteric fat.  Multiple lymph nodes in the hilum of the liver, including portacaval nodes and periportal nodes. Additional lymph nodes in the preaortic  nodal station. These are all borderline enlarged.  No evidence of hydronephrosis or nephrolithiasis. Low-density cystic lesion on the lateral cortex of the left kidney, compatible with Bosniak 1 cyst.  Unremarkable course of the bilateral ureters. Unremarkable urinary bladder.  Enteric contrast reaches the transverse colon. Moderate to large stool burden.  No transition point or point of obstruction identified. No abnormally distended small bowel. Unremarkable thickness of the small bowel wall and the colonic wall. Appendix not visualized, though there does not appear to be any specific focal inflammatory changes at the cecum.  No significant atherosclerotic changes though there are scattered calcifications.  Unremarkable appearance of the portal system with patency of the portal vein. Splenic vein remains patent. No significant esophageal varices or gastric varices identified.  Right-sided hydrocele.  No displaced fracture.  Mild degenerative changes of the spine.  IMPRESSION: No evidence of bowel obstruction, with enteric contrast reaching the transverse colon. Moderate stool burden suggests constipation, or potentially colonic ileus.  Stigmata of cirrhosis and portal hypertension, with hepatosplenomegaly and ascites.  Cholelithiasis. If there is concern for acute biliary abnormality, recommend correlation with lab values and patient presentation.  Additional findings as above.  Signed,  Dulcy Fanny. Earleen Newport, DO  [facility]  [facility]   Electronically Signed   By: Corrie Mckusick D.O.   On: 11/11/2014  20:48    Scheduled Meds: . furosemide  40 mg Oral Daily  . lactulose  30 g Oral TID  . levothyroxine  100 mcg Oral QAC breakfast  . magnesium oxide  400 mg Oral Daily  . metroNIDAZOLE  500 mg Oral 3 times per day  . saccharomyces boulardii  250 mg Oral BID  . spironolactone  100 mg Oral Daily  . vancomycin  500 mg Oral 4 times per day  . venlafaxine XR  150 mg Oral Q breakfast   Continuous Infusions:      Time spent: 35 minutes    [facility]  [facility] Pager [phone] If 7PM-7AM, please contact night-coverage at [email or web address], password [facility] 11/12/2014, 2:16 PM  LOS: 9 days

## 2014-11-12 NOTE — Progress Notes (Signed)
Nutrition Brief Note  Patient identified on the Malnutrition Screening Tool (MST) Report  Wt Readings from Last 15 Encounters:  11/08/14 254 lb 10.1 oz (115.5 kg)  09/05/13 248 lb (112.492 kg)  03/05/13 256 lb (116.121 kg)  05/08/12 227 lb 3.2 oz (103.057 kg)  12/06/11 238 lb (107.956 kg)  09/17/11 222 lb 2 oz (100.755 kg)  08/27/11 235 lb (106.595 kg)  08/04/11 230 lb 4 oz (104.441 kg)  06/13/09 220 lb (99.791 kg)  06/04/09 211 lb (95.709 kg)  05/29/09 216 lb (97.977 kg)  08/05/08 220 lb (99.791 kg)  04/25/08 226 lb (102.513 kg)    Body mass index is 35.53 kg/(m^2). Patient meets criteria for obesity, class II based on current BMI.   Current diet order is rena/ carb modified with 1200 ml fluid restriction, patient is consuming approximately 75-100% of meals at this time. Labs and medications reviewed.   No nutrition interventions warranted at this time. If nutrition issues arise, please consult RD.   Arlet Marter A. Jimmye Norman, RD, LDN, CDE Pager: [phone] After hours Pager: [phone]

## 2014-11-13 DIAGNOSIS — K7031 Alcoholic cirrhosis of liver with ascites: Secondary | ICD-10-CM

## 2014-11-13 LAB — COMPREHENSIVE METABOLIC PANEL
ALBUMIN: 2.3 g/dL — AB (ref 3.5–5.0)
ALT: 37 U/L (ref 17–63)
AST: 94 U/L — ABNORMAL HIGH (ref 15–41)
Alkaline Phosphatase: 246 U/L — ABNORMAL HIGH (ref 38–126)
Anion gap: 9 (ref 5–15)
BUN: 12 mg/dL (ref 6–20)
CALCIUM: 8.6 mg/dL — AB (ref 8.9–10.3)
CHLORIDE: 101 mmol/L (ref 101–111)
CO2: 27 mmol/L (ref 22–32)
Creatinine, Ser: 0.8 mg/dL (ref 0.61–1.24)
GFR calc non Af Amer: >60 mL/min (ref >60)
Glucose, Bld: 126 mg/dL — ABNORMAL HIGH (ref 65–99)
POTASSIUM: 3.7 mmol/L (ref 3.5–5.1)
Sodium: 137 mmol/L (ref 135–145)
Total Bilirubin: 4.1 mg/dL — ABNORMAL HIGH (ref 0.3–1.2)
Total Protein: 6.4 g/dL — ABNORMAL LOW (ref 6.5–8.1)

## 2014-11-13 MED ORDER — HYDROCORTISONE 2.5 % RE CREA
TOPICAL_CREAM | Freq: Three times a day (TID) | RECTAL | Status: DC
Start: 2014-11-13 — End: 2014-11-18
  Administered 2014-11-13 – 2014-11-18 (×8): via RECTAL
  Filled 2014-11-13: qty 28.35

## 2014-11-13 NOTE — Progress Notes (Signed)
[facility] PROGRESS NOTE   HEIDI LEMAY QVZ:[id number] DOB: [birth year] DOA: 11/03/2014 PCP: Joycelyn Man, MD  Brief summary 54 year old Caucasian male with a history of alcoholism, presented with a 3 day history of bloody diarrhea. Patient was hypotensive and confused. At initial presentation with acute renal failure. Patient was admitted to the intensive care unit. He underwent renal replacement treatment. Dialysis catheter was placed. Stool came back positive for C. difficile. After stabilization, patient was transferred to the floor.  Subjective: Patient reports semi-formed bowel movements this morning. After he wiped himself clean he noticed some blood on the tissue paper. Overall feels better.   Assessment/Plan: Principal Problem:   Acute renal failure Active Problems:   Alcoholic cirrhosis   Hyperkalemia   Shock circulatory   Lactic acidosis   Acute blood loss anemia   Lower GI bleed   C. difficile colitis    Acute renal failure Patient presented to the hospital with creatinine of 11.2, baseline creatinine 0.9 from 2 years ago. He was admitted to the ICU, seen by nephrology. Start on CRRT, currently off of dialysis. Dialysis catheter removed. Creatinine is back to normal at 0.93.  Abdominal distention Patient has some abdominal distention, KUB was done and showed possible ileus. CT scan of abdomen was done showed moderate stool burden. Mobilize. Continues to have bowel movement. Cut back on lactulose due to frequency of bowel movements. Could consider stool softeners.  GI bleed Lower GI bleed likely, patient did have EGD done in April 2013 showed no evidence of varices. Patient came in with anemia and what is suspected to be lower GI bleed. GI was not consulted as the bleeding was suspected to be either due to his C. difficile or hemorrhoids. Hemorrhoidal cream will be prescribed. He is followed by [name] with gastroenterology. Outpatient follow-up should  suffice.   C. difficile colitis Positive for C. difficile, started on oral Flagyl and vancomycin. Currently stools firming up, likely to treat for total of 10-14 days.  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 Presented with hemoglobin of 6.2, patient baseline from 2 years ago as 13.4. Status post transfusion of 4 units of back RBCs.   Alcoholic cirrhosis Patient has hypoalbuminemia, hyponatremia, thrombocytopenia, anemia and ascites. No evidence of hepatic encephalopathy. Not on diuresis because of recent acute kidney injury/failure. He was started on low-dose Lasix/Aldactone.   Elevated troponin On admission cardiac enzymes went up to 1.08, patient was hypotensive and had shock then. This is thought secondary to demand ischemia because of circulatory shock, also acute renal failure. 2-D echo showed no wall motion abnormality.  Circulatory shock Likely secondary to lower GI bleed and acute renal failure, this is resolved.  Hyperkalemia Secondary to acute renal failure, resolved.  DVT prophylaxis: SCDs Code Status: Full code Family Communication: Plan discussed with the patient. Disposition Plan: Seen by PT and OT. Home health recommended.   Procedures:  Insertion of CVL on 11/04/2014.  Insertion of hemodialysis access on 11/05/2014.  Transfusion of 3 units of packed RBCs on 7/3.  Transfusion of 1 unit of FFP on 7/4.  Antibiotics:  Oral Flagyl and oral vancomycin for C. difficile colitis.   Objective: Filed Vitals:   11/13/14 0457  BP: 136/80  Pulse: 109  Temp: 98.1 F (36.7 C)  Resp: 18    Intake/Output Summary (Last 24 hours) at 11/13/14 1154 Last data filed at 11/13/14 0900  Gross per 24 hour  Intake  1044 ml  Output    725 ml  Net    319 ml   Filed Weights   11/06/14 0500 11/07/14 0355 11/08/14 0500  Weight: 116.2 kg (256 lb 2.8 oz) 116.89 kg (257 lb 11.1 oz) 115.5 kg  (254 lb 10.1 oz)    Exam: General: Alert and awake, oriented x3, not in any acute distress. CVS: S1-S2 clear, no murmur rubs or gallops Chest: clear to auscultation bilaterally, no wheezing, rales or rhonchi Abdomen: soft nontender, mildly distended. Bowel sounds normal. no organomegaly Extremities: no cyanosis, clubbing or edema noted bilaterally Neuro: Cranial nerves II-XII intact, no focal neurological deficits  Data Reviewed: Basic Metabolic Panel:  Recent Labs Lab 11/06/14 1645 11/07/14 0358  11/08/14 1020 11/09/14 0308 11/10/14 0405 11/11/14 0424 11/12/14 0327 11/13/14 0326  NA 128* 127*  < >  --  131* 132* 135 133* 137  K 4.6 4.8  < >  --  4.2 4.0 3.9 4.0 3.7  CL 93* 92*  < >  --  97* 97* 99* 99* 101  CO2 23 24  < >  --  25 25 28 24 27   GLUCOSE 108* 102*  < >  --  93 82 92 89 126*  BUN 53* 55*  < >  --  39* 26* 20 16 12   CREATININE 4.13* 3.59*  < >  --  1.34* 0.93 0.82 0.79 0.80  CALCIUM 7.2* 7.3*  < >  --  7.7* 8.0* 8.6* 8.5* 8.6*  MG  --  1.6*  --  1.1*  --  1.4* 2.0  --   --   PHOS 5.0*  --   --  3.5  --   --   --   --   --   < > = values in this interval not displayed. Liver Function Tests:  Recent Labs Lab 11/09/14 0308 11/10/14 0405 11/11/14 0424 11/12/14 0327 11/13/14 0326  AST 109* 107* 105* 98* 94*  ALT 40 38 39 37 37  ALKPHOS 232* 248* 262* 244* 246*  BILITOT 3.7* 4.3* 5.5* 4.4* 4.1*  PROT 6.0* 6.2* 6.4* 6.2* 6.4*  ALBUMIN 2.3* 2.3* 2.4* 2.3* 2.3*   CBC:  Recent Labs Lab 11/08/14 0455 11/09/14 0308 11/10/14 0405 11/11/14 1200 11/12/14 0923  WBC 6.8 6.5 5.6 5.8 5.5  HGB 7.7* 7.4* 7.3* 8.4* 8.3*  HCT 22.7* 21.8* 21.9* 25.1* 25.0*  MCV 95.8 97.3 100.9* 97.7 98.0  PLT 85* 74* 76* 97* 102*    Micro Recent Results (from the past 240 hour(s))  Culture, blood (routine x 2)     Status: None   Collection Time: 11/03/14 11:13 PM  Result Value Ref Range Status   Specimen Description BLOOD RIGHT ANTECUBITAL  Final   Special Requests BOTTLES  DRAWN AEROBIC AND ANAEROBIC 5CC EA  Final   Culture NO GROWTH 5 DAYS  Final   Report Status 11/09/2014 FINAL  Final  Culture, blood (routine x 2)     Status: None   Collection Time: 11/03/14 11:20 PM  Result Value Ref Range Status   Specimen Description BLOOD RIGHT HAND  Final   Special Requests BOTTLES DRAWN AEROBIC ONLY 6CC  Final   Culture NO GROWTH 5 DAYS  Final   Report Status 11/09/2014 FINAL  Final  MRSA PCR Screening     Status: None   Collection Time: 11/04/14 12:58 AM  Result Value Ref Range Status   MRSA by PCR NEGATIVE NEGATIVE Final    Comment:  The GeneXpert MRSA Assay (FDA approved for NASAL specimens only), is one component of a comprehensive MRSA colonization surveillance program. It is not intended to diagnose MRSA infection nor to guide or monitor treatment for MRSA infections.   Urine culture     Status: None   Collection Time: 11/04/14  2:23 AM  Result Value Ref Range Status   Specimen Description URINE, CATHETERIZED  Final   Special Requests NONE  Final   Culture   Final    MULTIPLE SPECIES PRESENT, SUGGEST RECOLLECTION IF CLINICALLY INDICATED   Report Status 11/06/2014 FINAL  Final  Clostridium Difficile by PCR (not at [facility])     Status: Abnormal   Collection Time: 11/06/14  9:00 AM  Result Value Ref Range Status   C difficile by pcr POSITIVE (A) NEGATIVE Final    Comment: CRITICAL RESULT CALLED TO, READ BACK BY AND VERIFIED WITH: Christain Sacramento RN 12:15 11/06/14 ([email or web address])      Studies: Ct Abdomen Pelvis W Contrast  11/11/2014   CLINICAL DATA:  54 year old male admitted with gastrointestinal bleeding and evidence of ileus on plain film.  EXAM: CT ABDOMEN AND PELVIS WITH CONTRAST  TECHNIQUE: Multidetector CT imaging of the abdomen and pelvis was performed using the standard protocol following bolus administration of intravenous contrast.  CONTRAST:  136mL OMNIPAQUE IOHEXOL 300 MG/ML  SOLN  COMPARISON:  CT 08/09/2011, plain film 11/11/2014  FINDINGS:  Lower chest:  Unremarkable appearance of the soft tissues of the chest wall.  Heart size within normal limits.  No pericardial fluid/thickening.  No lower mediastinal adenopathy.  Unremarkable appearance of the distal esophagus.  Small hiatal hernia.  No confluent airspace disease, pleural fluid, or pneumothorax within visualized lung.  Abdomen/pelvis:  Cirrhotic changes of the liver. Cranial caudal span of the liver measures 22 cm. Cranial caudal span of the spleen measures 16 cm.  Unremarkable bilateral adrenal glands.  Unremarkable pancreas.  Radiopaque gallstones within the gallbladder. No gallbladder wall thickening. There is adjacent fluid though this is likely related to ascites.  Free fluid throughout the abdomen, most pronounced within the bilateral pericolic gutter, perisplenic region,. Pack region, and dependently within the pelvis.  Haziness of the mesenteric fat.  Multiple lymph nodes in the hilum of the liver, including portacaval nodes and periportal nodes. Additional lymph nodes in the preaortic nodal station. These are all borderline enlarged.  No evidence of hydronephrosis or nephrolithiasis. Low-density cystic lesion on the lateral cortex of the left kidney, compatible with Bosniak 1 cyst.  Unremarkable course of the bilateral ureters. Unremarkable urinary bladder.  Enteric contrast reaches the transverse colon. Moderate to large stool burden.  No transition point or point of obstruction identified. No abnormally distended small bowel. Unremarkable thickness of the small bowel wall and the colonic wall. Appendix not visualized, though there does not appear to be any specific focal inflammatory changes at the cecum.  No significant atherosclerotic changes though there are scattered calcifications.  Unremarkable appearance of the portal system with patency of the portal vein. Splenic vein remains patent. No significant esophageal varices or gastric varices identified.  Right-sided hydrocele.  No  displaced fracture.  Mild degenerative changes of the spine.  IMPRESSION: No evidence of bowel obstruction, with enteric contrast reaching the transverse colon. Moderate stool burden suggests constipation, or potentially colonic ileus.  Stigmata of cirrhosis and portal hypertension, with hepatosplenomegaly and ascites.  Cholelithiasis. If there is concern for acute biliary abnormality, recommend correlation with lab values and patient presentation.  Additional findings as above.  Signed,  Dulcy Fanny. Earleen Newport, DO  [facility]  [facility]   Electronically Signed   By: Corrie Mckusick D.O.   On: 11/11/2014 20:48    Scheduled Meds: . furosemide  40 mg Oral Daily  . levothyroxine  100 mcg Oral QAC breakfast  . magnesium oxide  400 mg Oral Daily  . metroNIDAZOLE  500 mg Oral 3 times per day  . saccharomyces boulardii  250 mg Oral BID  . spironolactone  100 mg Oral Daily  . vancomycin  500 mg Oral 4 times per day  . venlafaxine XR  150 mg Oral Q breakfast   Continuous Infusions:    [facility]  [facility] Pager [phone]   If 7PM-7AM, please contact night-coverage at [email or web address], password [facility] 11/13/2014, 11:54 AM  LOS: 10 days

## 2014-11-13 NOTE — Progress Notes (Signed)
Patient had another loose brown medium stool this morning and noted dried blood streaks on bilateral legs.

## 2014-11-13 NOTE — Plan of Care (Signed)
Problem: Phase II Progression Outcomes Goal: Progress activity as tolerated unless otherwise ordered Outcome: Progressing Moving around in room and to bathroom Goal: Discharge plan established Outcome: Progressing Plan to discharge 7/14

## 2014-11-13 NOTE — Progress Notes (Signed)
According to patient, he had brown loose stools x 2 but no trace of blood.

## 2014-11-13 NOTE — Progress Notes (Signed)
According to patient around 2200, he had again loose brown stool with no signs of blood but when he wiped his bottom noted streaks of fresh red blood.  This nurse then checked patient's bottom and noted blood clots in bottom and external hemorrhoids which patient confirmed that he had hemorrhoids.

## 2014-11-14 LAB — CBC
HCT: 25.6 % — ABNORMAL LOW (ref 39.0–52.0)
Hemoglobin: 8.3 g/dL — ABNORMAL LOW (ref 13.0–17.0)
MCH: 32.3 pg (ref 26.0–34.0)
MCHC: 32.4 g/dL (ref 30.0–36.0)
MCV: 99.6 fL (ref 78.0–100.0)
Platelets: 129 10*3/uL — ABNORMAL LOW (ref 150–400)
RBC: 2.57 MIL/uL — AB (ref 4.22–5.81)
RDW: 19 % — ABNORMAL HIGH (ref 11.5–15.5)
WBC: 6.1 10*3/uL (ref 4.0–10.5)

## 2014-11-14 MED ORDER — SPIRONOLACTONE 100 MG PO TABS: 100.0000 mg | ORAL_TABLET | Freq: Every day | ORAL | Status: DC

## 2014-11-14 MED ORDER — LEVOTHYROXINE SODIUM 50 MCG PO TABS
50.0000 ug | ORAL_TABLET | Freq: Every day | ORAL | Status: DC
Start: 2014-11-14 — End: 2015-01-14

## 2014-11-14 MED ORDER — MAGNESIUM OXIDE 400 (241.3 MG) MG PO TABS: 400.0000 mg | ORAL_TABLET | Freq: Every day | ORAL | Status: DC

## 2014-11-14 MED ORDER — METRONIDAZOLE 500 MG PO TABS: 500.0000 mg | ORAL_TABLET | Freq: Three times a day (TID) | ORAL | Status: DC

## 2014-11-14 MED ORDER — OXYCODONE HCL 5 MG PO TABS: 5.0000 mg | ORAL_TABLET | Freq: Four times a day (QID) | ORAL | Status: DC | PRN

## 2014-11-14 MED ORDER — HYDROCORTISONE 2.5 % RE CREA: TOPICAL_CREAM | Freq: Three times a day (TID) | RECTAL | Status: DC

## 2014-11-14 MED ORDER — VANCOMYCIN HCL 250 MG PO CAPS: 500.0000 mg | ORAL_CAPSULE | Freq: Four times a day (QID) | ORAL | Status: DC

## 2014-11-14 MED ORDER — FUROSEMIDE 40 MG PO TABS: 40.0000 mg | ORAL_TABLET | Freq: Every day | ORAL | Status: DC

## 2014-11-14 MED ORDER — SACCHAROMYCES BOULARDII 250 MG PO CAPS: 250.0000 mg | ORAL_CAPSULE | Freq: Two times a day (BID) | ORAL | Status: DC

## 2014-11-14 NOTE — Discharge Summary (Signed)
[facility]  Physician Discharge Summary   Patient ID: Austin Padilla MRN: [id number] DOB/AGE: January 01, 1961 54 y.o.  Admit date: 11/03/2014 Discharge date: 11/14/2014  PCP: Joycelyn Man, MD  DISCHARGE DIAGNOSES:  Principal Problem:   Acute renal failure Active Problems:   Alcoholic cirrhosis   Hyperkalemia   Shock circulatory   Lactic acidosis   Acute blood loss anemia   Lower GI bleed   C. difficile colitis   RECOMMENDATIONS FOR OUTPATIENT FOLLOW UP: 1. Home health has been ordered. 2. Repeat thyroid function tests in 3-4 weeks.   DISCHARGE CONDITION: fair  Diet recommendation: Low sodium  Filed Weights   11/06/14 0500 11/07/14 0355 11/08/14 0500  Weight: 116.2 kg (256 lb 2.8 oz) 116.89 kg (257 lb 11.1 oz) 115.5 kg (254 lb 10.1 oz)    INITIAL HISTORY: 54 year old Caucasian male with a history of alcoholism, presented with a 3 day history of bloody diarrhea. Patient was hypotensive and confused. At initial presentation with acute renal failure. Patient was admitted to the intensive care unit. He underwent renal replacement treatment. Dialysis catheter was placed. Stool came back positive for C. difficile. After stabilization, patient was transferred to the floor.  Procedures:  Insertion of CVL on 11/04/2014.  Insertion of hemodialysis access on 11/05/2014. Removed subsequently  Transfusion of 4 units of packed RBCs.  Transfusion of 1 unit of FFP on 7/4.  2-D echocardiogram Study Conclusions - Left ventricle: The cavity size was normal. Wall thickness wasnormal. Systolic function was normal. The estimated ejectionfraction was in the range of 60% to 65%. Abnormal diastolicfunction, indeterminate grade. - Aortic valve: Mildly calcified annulus. Trileaflet; mildlythickened leaflets. Valve area (VTI): 3.67 cm^2. Valve area(Vmax): 3.36 cm^2. - Mitral valve: Mildly calcified annulus. Mildly thickened leaflets. There was mild to moderate regurgitation.  The jet is eccentricand posterior directed, this may lead to underestimation ofseverity. - Left atrium: The atrium was severely dilated. - Right ventricle: The cavity size was mildly dilated. - Right atrium: The atrium was severely dilated. - Technically difficult study.  HOSPITAL COURSE:   Acute renal failure Patient presented to the hospital with creatinine of 11.2, baseline creatinine 0.9 from 2 years ago. He was admitted to the ICU, seen by nephrology. Start on CRRT, currently off of dialysis. Dialysis catheter removed. Creatinine is back to normal.  Abdominal distention Patient has some abdominal distention, KUB was done and showed possible ileus. CT scan of abdomen was done showed moderate stool burden. Mobilize. Continues to have bowel movement. Patient does not have any nausea or vomiting. He is tolerating his diet. This should resolve eventually. Abdomen is nontender.  GI bleed Lower GI bleed likely. Patient did have EGD done in April 2013 showed no evidence of varices. Patient came in with anemia and what is suspected to be lower GI bleed. GI was not consulted as the bleeding was suspected to be either due to his C. difficile or hemorrhoids. Hemorrhoidal cream will be prescribed. He is followed by [name] with gastroenterology. Outpatient follow-up should suffice. Hemoglobin has been stable after initial drop.  C. difficile colitis Positive for C. difficile, started on oral Flagyl and vancomycin. Currently stools firming up, likely to treat for total of 10-14 days.  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 Presented with hemoglobin of 6.2, patient baseline from 2 years ago as 13.4. Status post transfusion of 4 units of PRBCs.   Alcoholic cirrhosis Patient has hypoalbuminemia, hyponatremia, thrombocytopenia, anemia  and ascites. No evidence of hepatic encephalopathy. Not on diuresis because of  recent acute kidney injury/failure. He was started on Lasix/Aldactone.   Hypothyroidism Newly diagnosed during this hospitalization with an elevated TSH greater than 12. Free T3 was low. Patient was started on Synthroid. Will be given a prescription for same. Will need repeat testing as outpatient.   Elevated troponin On admission cardiac enzymes went up to 1.08, patient was hypotensive and had shock then. This is thought secondary to demand ischemia because of circulatory shock, also acute renal failure. 2-D echo showed normal ejection fraction.   Circulatory shock Likely secondary to lower GI bleed and acute renal failure, this is resolved.  Hyperkalemia Secondary to acute renal failure, resolved.  History of essential hypertension Blood Pressure has been stable. Since he's been started on diuretics, his ARB has been discontinued for now.  Long discussion with patient and his parents today. Parents are very concerned about this patient's living situation and alcohol consumption. They are also concerned about safety and weakness as a result of this hospitalization. Patient has been seen by physical therapy. Home health with 24-hour supervision was recommended. He does live by himself. In view of this, I did recommend that he consider going to a supervised setting for short duration of time. Social worker was consulted to facilitate. After much discussion, patient has declined and wants to go home. He has been told the risks of doing so, including decline in general medical condition as well as high risk for falls and injuries which could lead to fractures and even death. He understands these risks and still chooses not to go to a supervised setting.    PERTINENT LABS:  The results of significant diagnostics from this hospitalization (including imaging, microbiology, ancillary and laboratory) are listed below for reference.    Microbiology: Recent Results (from the past 240 hour(s))    Clostridium Difficile by PCR (not at [facility])     Status: Abnormal   Collection Time: 11/06/14  9:00 AM  Result Value Ref Range Status   C difficile by pcr POSITIVE (A) NEGATIVE Final    Comment: CRITICAL RESULT CALLED TO, READ BACK BY AND VERIFIED WITH: Christain [place] RN 12:15 11/06/14 ([email or web address])      Labs: Basic Metabolic Panel:  Recent Labs Lab 11/08/14 1020 11/09/14 0308 11/10/14 0405 11/11/14 0424 11/12/14 0327 11/13/14 0326  NA  --  131* 132* 135 133* 137  K  --  4.2 4.0 3.9 4.0 3.7  CL  --  97* 97* 99* 99* 101  CO2  --  25 25 28 24 27   GLUCOSE  --  93 82 92 89 126*  BUN  --  39* 26* 20 16 12   CREATININE  --  1.34* 0.93 0.82 0.79 0.80  CALCIUM  --  7.7* 8.0* 8.6* 8.5* 8.6*  MG 1.1*  --  1.4* 2.0  --   --   PHOS 3.5  --   --   --   --   --    Liver Function Tests:  Recent Labs Lab 11/09/14 0308 11/10/14 0405 11/11/14 0424 11/12/14 0327 11/13/14 0326  AST 109* 107* 105* 98* 94*  ALT 40 38 39 37 37  ALKPHOS 232* 248* 262* 244* 246*  BILITOT 3.7* 4.3* 5.5* 4.4* 4.1*  PROT 6.0* 6.2* 6.4* 6.2* 6.4*  ALBUMIN 2.3* 2.3* 2.4* 2.3* 2.3*   CBC:  Recent Labs Lab 11/09/14 0308 11/10/14 0405 11/11/14 1200 11/12/14 0923 11/14/14 0349  WBC 6.5 5.6 5.8  5.5 6.1  HGB 7.4* 7.3* 8.4* 8.3* 8.3*  HCT 21.8* 21.9* 25.1* 25.0* 25.6*  MCV 97.3 100.9* 97.7 98.0 99.6  PLT 74* 76* 97* 102* 129*    IMAGING STUDIES Dg Abd 1 View  11/11/2014   CLINICAL DATA:  Abdominal distention and discomfort, history of cirrhosis, acute renal failure, blood loss anemia, and C difficile colitis.  EXAM: ABDOMEN - 1 VIEW  COMPARISON:  None.  FINDINGS: There is moderate gaseous distention of small and large bowel loops. A small amount of gas is present in the rectum. There is some stool in the right colon. No abnormal soft tissue calcifications are observed. The bony structures exhibit no acute abnormalities.  IMPRESSION: Bowel gas pattern most compatible with a generalized ileus. No definite obstructive  pattern is observed. A three-way abdominal series or abdominal and pelvic CT scan would be useful for further evaluation.   Electronically Signed   By: David  Martinique M.D.   On: 11/11/2014 10:08   US Abdomen Complete  11/04/2014   CLINICAL DATA:  Alcoholic cirrhosis, renal failure, hypertension  EXAM: ULTRASOUND ABDOMEN COMPLETE  COMPARISON:  CT abdomen and pelvis 08/09/2011  FINDINGS: Gallbladder: Distended gallbladder containing sludge. No definite shadowing calculi. No gallbladder wall thickening, pericholecystic fluid or sonographic Murphy sign.  Common bile duct: Diameter: 3 mm diameter, normal  Liver: Heterogeneous increased echogenicity with slightly nodular margins compatible with cirrhosis. Hepatopetal portal venous flow. No discrete hepatic mass identified, though assessment of intrahepatic detail is limited by sound attenuation.  IVC: Normal appearance  Pancreas: Obscured by bowel gas  Spleen: Appears mildly enlarged, 1 cm length, calculated volume 548 mL. No focal abnormalities  Right Kidney: Length: 12.5 cm. Normal morphology without mass or hydronephrosis. Small amount of perinephric fluid.  Left Kidney: Length: 12.1 cm. Normal morphology without mass or hydronephrosis.  Abdominal aorta: Predominately obscured by bowel gas.  Other findings: Small amount ascites.  Small LEFT pleural effusion.  IMPRESSION: Significant sludge within gallbladder without definite shadowing calculi.  Cirrhotic appearing liver with mild splenomegaly.  Incomplete visualization of pancreas and aorta.  Small amount of nonspecific RIGHT perinephric fluid.   Electronically Signed   By: Lavonia Dana M.D.   On: 11/04/2014 09:56   Ct Abdomen Pelvis W Contrast  11/11/2014   CLINICAL DATA:  54 year old male admitted with gastrointestinal bleeding and evidence of ileus on plain film.  EXAM: CT ABDOMEN AND PELVIS WITH CONTRAST  TECHNIQUE: Multidetector CT imaging of the abdomen and pelvis was performed using the standard protocol  following bolus administration of intravenous contrast.  CONTRAST:  156mL OMNIPAQUE IOHEXOL 300 MG/ML  SOLN  COMPARISON:  CT 08/09/2011, plain film 11/11/2014  FINDINGS: Lower chest:  Unremarkable appearance of the soft tissues of the chest wall.  Heart size within normal limits.  No pericardial fluid/thickening.  No lower mediastinal adenopathy.  Unremarkable appearance of the distal esophagus.  Small hiatal hernia.  No confluent airspace disease, pleural fluid, or pneumothorax within visualized lung.  Abdomen/pelvis:  Cirrhotic changes of the liver. Cranial caudal span of the liver measures 22 cm. Cranial caudal span of the spleen measures 16 cm.  Unremarkable bilateral adrenal glands.  Unremarkable pancreas.  Radiopaque gallstones within the gallbladder. No gallbladder wall thickening. There is adjacent fluid though this is likely related to ascites.  Free fluid throughout the abdomen, most pronounced within the bilateral pericolic gutter, perisplenic region,. Pack region, and dependently within the pelvis.  Haziness of the mesenteric fat.  Multiple lymph nodes in the hilum of  the liver, including portacaval nodes and periportal nodes. Additional lymph nodes in the preaortic nodal station. These are all borderline enlarged.  No evidence of hydronephrosis or nephrolithiasis. Low-density cystic lesion on the lateral cortex of the left kidney, compatible with Bosniak 1 cyst.  Unremarkable course of the bilateral ureters. Unremarkable urinary bladder.  Enteric contrast reaches the transverse colon. Moderate to large stool burden.  No transition point or point of obstruction identified. No abnormally distended small bowel. Unremarkable thickness of the small bowel wall and the colonic wall. Appendix not visualized, though there does not appear to be any specific focal inflammatory changes at the cecum.  No significant atherosclerotic changes though there are scattered calcifications.  Unremarkable appearance of the  portal system with patency of the portal vein. Splenic vein remains patent. No significant esophageal varices or gastric varices identified.  Right-sided hydrocele.  No displaced fracture.  Mild degenerative changes of the spine.  IMPRESSION: No evidence of bowel obstruction, with enteric contrast reaching the transverse colon. Moderate stool burden suggests constipation, or potentially colonic ileus.  Stigmata of cirrhosis and portal hypertension, with hepatosplenomegaly and ascites.  Cholelithiasis. If there is concern for acute biliary abnormality, recommend correlation with lab values and patient presentation.  Additional findings as above.  Signed,  Dulcy Fanny. Earleen Newport, DO  [facility]  [facility]   Electronically Signed   By: Corrie Mckusick D.O.   On: 11/11/2014 20:48   Dg Chest Port 1 View  11/06/2014   CLINICAL DATA:  Respiratory failure  EXAM: PORTABLE CHEST - 1 VIEW  COMPARISON:  11/05/2014  FINDINGS: Right IJ central line, tip at the distal SVC. Previously seen left IJ catheter has been removed.  Stable heart size and mediastinal contours.  Streaky opacity in the left lower lung. No edema, effusion, or pneumothorax.  IMPRESSION: 1. Focal opacity at the left base could reflect pneumonia or pneumonitis. 2. The remaining central line is in good position.   Electronically Signed   By: Monte Fantasia M.D.   On: 11/06/2014 05:24   Dg Chest Port 1 View  11/05/2014   CLINICAL DATA:  Central line placement  EXAM: PORTABLE CHEST - 1 VIEW  COMPARISON:  11/04/2014  FINDINGS: New left IJ central line is kinked at the tip, with tip directed to the left. Arterial and venous structures overlap in this region, but reportedly blood gas confirms venous placement. The tip could be directed into the distal left subclavian vein or a branch vessel (including IMV). No pneumothorax.  Increased opacification of the lower chest with lower lung volumes. No overt pulmonary edema or  pleural effusion. Stable mild cardiomegaly.  These results were called by telephone at the time of interpretation on 11/05/2014 at 1:54 am to [place] via ICU RN.  IMPRESSION: 1. Abnormal positioning and kinking of the new left IJ dialysis catheter, as above. 2. No pneumothorax. 3. Worsening aeration since yesterday.   Electronically Signed   By: Monte Fantasia M.D.   On: 11/05/2014 01:58   Dg Chest Port 1 View  11/04/2014   CLINICAL DATA:  GI bleed.  Line placement.  EXAM: PORTABLE CHEST - 1 VIEW  COMPARISON:  Chest x-ray from the same date at 00:09  FINDINGS: Right IJ central line, tip still at the lower SVC.  Normal heart size for technique. Negative aortic and hilar contours.  Stable lung inflation. No edema, effusion, consolidation, or air leak.  IMPRESSION: Cleared lung opacity compatible with resolved edema or atelectasis.  Electronically Signed   By: Monte Fantasia M.D.   On: 11/04/2014 05:43   Dg Chest Port 1 View  11/04/2014   CLINICAL DATA:  Central line placement.  Initial encounter.  EXAM: PORTABLE CHEST - 1 VIEW  COMPARISON:  None.  FINDINGS: A right IJ line is noted ending about the mid SVC.  The lungs are mildly hypoexpanded. Vascular congestion is noted, with mildly increased interstitial markings, concerning for mild interstitial edema. No pleural effusion or pneumothorax is seen.  The cardiomediastinal silhouette is borderline normal in size. No acute osseous abnormalities are identified.  IMPRESSION: 1. Right IJ line noted ending about the mid SVC. 2. Lungs mildly hypoexpanded. Vascular congestion, with mildly increased interstitial markings, raising question for mild interstitial edema.   Electronically Signed   By: Garald Balding M.D.   On: 11/04/2014 00:33    DISCHARGE EXAMINATION: Filed Vitals:   11/13/14 1514 11/13/14 2221 11/14/14 0559 11/14/14 1351  BP: 145/81 136/85 141/81 146/84  Pulse: 109 109 108 118  Temp: 98.4 F (36.9 C) 98.3 F (36.8 C) 98.1 F (36.7 C) 98.3 F  (36.8 C)  TempSrc: Oral Oral Oral Oral  Resp: 18 18 16 18   Height:      Weight:      SpO2: 96% 97% 97% 96%   General appearance: alert, cooperative, appears stated age and no distress Resp: clear to auscultation bilaterally Cardio: regular rate and rhythm, S1, S2 normal, no murmur, click, rub or gallop GI: soft, mildly distended. non-tender; bowel sounds normal; no masses,  no organomegaly Neurologic: Alert and oriented 3. No focal neurological deficits are noted.  DISPOSITION: Home with home health  Discharge Instructions    Call MD for:  extreme fatigue    Complete by:  As directed      Call MD for:  persistant dizziness or light-headedness    Complete by:  As directed      Call MD for:  persistant nausea and vomiting    Complete by:  As directed      Call MD for:  severe uncontrolled pain    Complete by:  As directed      Call MD for:  temperature >100.4    Complete by:  As directed      Diet - low sodium heart healthy    Complete by:  As directed      Discharge instructions    Complete by:  As directed   You will need to follow-up with [name] regarding the episodes of rectal bleeding. Take medications as prescribed. Seek attention if you have recurrence of rectal bleeding. The hemorrhoidal component of bleeding should subside in a few more days.  You were cared for by a hospitalist during your hospital stay. If you have any questions about your discharge medications or the care you received while you were in the hospital after you are discharged, you can call the unit and asked to speak with the hospitalist on call if the hospitalist that took care of you is not available. Once you are discharged, your primary care physician will handle any further medical issues. Please note that NO REFILLS for any discharge medications will be authorized once you are discharged, as it is imperative that you return to your primary care physician (or establish a relationship with a primary  care physician if you do not have one) for your aftercare needs so that they can reassess your need for medications and monitor your lab values. If you do  not have a primary care physician, you can call [phone] for a physician referral.     Increase activity slowly    Complete by:  As directed            ALLERGIES:  Allergies  Allergen Reactions  . Metoclopramide Hcl     REACTION: unspecified     Current Discharge Medication List    START taking these medications   Details  furosemide (LASIX) 40 MG tablet Take 1 tablet (40 mg total) by mouth daily. Qty: 30 tablet, Refills: 2    hydrocortisone (ANUSOL-HC) 2.5 % rectal cream Place rectally 3 (three) times daily. Qty: 30 g, Refills: 0    levothyroxine (SYNTHROID, LEVOTHROID) 50 MCG tablet Take 1 tablet (50 mcg total) by mouth daily before breakfast. Qty: 30 tablet, Refills: 3    magnesium oxide (MAG-OX) 400 (241.3 MG) MG tablet Take 1 tablet (400 mg total) by mouth daily. Qty: 30 tablet, Refills: 0    metroNIDAZOLE (FLAGYL) 500 MG tablet Take 1 tablet (500 mg total) by mouth every 8 (eight) hours. For 7 more days Qty: 21 tablet, Refills: 0    oxyCODONE (OXY IR/ROXICODONE) 5 MG immediate release tablet Take 1 tablet (5 mg total) by mouth every 6 (six) hours as needed for severe pain. Qty: 15 tablet, Refills: 0    saccharomyces boulardii (FLORASTOR) 250 MG capsule Take 1 capsule (250 mg total) by mouth 2 (two) times daily. Qty: 30 capsule, Refills: 0    spironolactone (ALDACTONE) 100 MG tablet Take 1 tablet (100 mg total) by mouth daily. Qty: 30 tablet, Refills: 2    vancomycin (VANCOCIN) 250 MG capsule Take 2 capsules (500 mg total) by mouth 4 (four) times daily. For 7 more days. Qty: 28 capsule, Refills: 0      CONTINUE these medications which have NOT CHANGED   Details  Multiple Vitamins-Minerals (CENTRUM PO) Take 1 tablet by mouth daily.    nadolol (CORGARD) 40 MG tablet TAKE 1 TABLET ONCE DAILY. Qty: 30 tablet,  Refills: 3    pantoprazole (PROTONIX) 40 MG tablet TAKE 1 TABLET DAILY. Qty: 30 tablet, Refills: 0    venlafaxine XR (EFFEXOR-XR) 150 MG 24 hr capsule TAKE (1) CAPSULE TWICE DAILY. Qty: 60 capsule, Refills: 0      STOP taking these medications     losartan (COZAAR) 50 MG tablet      cephALEXin (KEFLEX) 500 MG capsule      HYDROcodone-ibuprofen (VICOPROFEN) 7.5-200 MG per tablet        Follow-up Information    Follow up with Dorothyann Peng, NP. Schedule an appointment as soon as possible for a visit on 11/19/2014.   Specialty:  Family Medicine   Why:  post hospitalization follow up Appt at 9:30am   Contact information:   [address] [place] [address] [phone]       Follow up with Erskine Emery, MD On 01/21/2015.   Specialty:  Gastroenterology   Why:  for rectal bleeding    Appt at 1:45   Contact information:   [address] [place] [address] [phone]       Follow up with [facility].   Why:  Home Health RN   Contact information:   [address] [place] [address] [phone]       TOTAL DISCHARGE TIME: 20 mins  [facility] Pager [phone]  11/14/2014, 2:19 PM

## 2014-11-14 NOTE — Progress Notes (Signed)
PT Cancellation Note  Patient Details Name: Austin Padilla MRN: [id number] DOB: [birth year]   Cancelled Treatment:    Reason Eval/Treat Not Completed: Patient declined, no reason specified Pt politely declines to work with therapy. Thankful for offering to practice further gait and mobility training. Family in room and encouraging pt to work with therapy as able however he feels that he would rather go home. Advised pt and family to arrange as much assistance and supervision as able at d/c.  Ellouise Newer 11/14/2014, 4:21 PM Camille Bal [place], [place]

## 2014-11-14 NOTE — Progress Notes (Signed)
  Called by RN that patient had a large bloody bowel movement. Have asked her to check vital signs. This is his first significant bleeding in last many days. Will check CBC.  Discussed with [name]. He will evaluate patient. If bleeding persists, he may need to be moved to stepdown and a bleeding scan will need to be obtained.  Will cancel discharge.  Austin Padilla 5:55 PM

## 2014-11-14 NOTE — Clinical Social Work Note (Signed)
CSW spoke with patient about going to SNF and CSW informed him of the advantages of going verses not going.  Patient stated he wants to think about it and to have CSW check back with him.  CSW spoke to patient and he decided he does not want to go to a SNF.  CSW spoke to patient about his alcohol use and offered him information about Alcoholics Anonymous meetings, residential substance abuse, and outpatient substance abuse programs.  Patient stated he does not feel like it is a problem right now, but he would welcome the information, information given to patient.  Patient plans to discharge home, he states he has transportation as well, CSW to sign off please reconsult if other social work needs arise.  Jones Broom. [place], MSW, [place] 11/14/2014 2:11 PM

## 2014-11-14 NOTE — Progress Notes (Signed)
Pt just had large bright red stool. Dr. Curly Rim notified.

## 2014-11-14 NOTE — Care Management Note (Addendum)
Case Management Note  Patient Details  Name: HENRIQUE PAREKH MRN: [id number] Date of Birth: 04/01/1961  Subjective/Objective:     C-diff               Action/Plan: Home Health  Expected Discharge Date:  11/14/2014               Expected Discharge Plan:  [place]  In-House Referral:  Clinical Social Work  Discharge planning Services  CM Consult  Post Acute Care Choice:  Home Health Choice offered to:     DME Arranged:  [facility] D[facility] Agency:  [place] Arranged:  RN [facility] Agency:  [place]  Status of Service:  Completed, signed off  Medicare Important Message Given:    Date Medicare IM Given:    Medicare IM give by:    Date Additional Medicare IM Given:    Additional Medicare Important Message give by:     If discussed at [place] of Stay Meetings, dates discussed:    Additional Comments: 2 [facility] spoke to pt and he refused HH at this time. Pt aware that he can speak to his PCP and HH can arrange from his office. Appts made for PCP and Gastroenterologist. Dr Deatra Ina office earliest appt was for 01/21/2015. Jonnie Finner RN CCM Case Mgmt phone [phone]   NCM spoke to pt and states he can afford his medications at pharmacy. Pt states he goes to [facility]. Faxed RX to [employer] to obtain price.  Contacted [facility] for [facility] RN for scheduled dc today. Pt may not qualify for [facility], [facility] rep will speak to pt. Contacted [facility] DME rep for RW for home.  Erenest Rasher, RN 11/14/2014, 1:57 PM

## 2014-11-15 DIAGNOSIS — D62 Acute posthemorrhagic anemia: Secondary | ICD-10-CM

## 2014-11-15 LAB — CBC
HCT: 25.9 % — ABNORMAL LOW (ref 39.0–52.0)
HEMOGLOBIN: 8.5 g/dL — AB (ref 13.0–17.0)
MCH: 32.9 pg (ref 26.0–34.0)
MCHC: 32.8 g/dL (ref 30.0–36.0)
MCV: 100.4 fL — ABNORMAL HIGH (ref 78.0–100.0)
Platelets: 138 10*3/uL — ABNORMAL LOW (ref 150–400)
RBC: 2.58 MIL/uL — AB (ref 4.22–5.81)
RDW: 19 % — AB (ref 11.5–15.5)
WBC: 5.3 10*3/uL (ref 4.0–10.5)

## 2014-11-15 LAB — BASIC METABOLIC PANEL
Anion gap: 8 (ref 5–15)
BUN: 8 mg/dL (ref 6–20)
CHLORIDE: 103 mmol/L (ref 101–111)
CO2: 25 mmol/L (ref 22–32)
Calcium: 8.1 mg/dL — ABNORMAL LOW (ref 8.9–10.3)
Creatinine, Ser: 0.68 mg/dL (ref 0.61–1.24)
GFR calc Af Amer: >60 mL/min (ref >60)
GLUCOSE: 97 mg/dL (ref 65–99)
Potassium: 3.5 mmol/L (ref 3.5–5.1)
SODIUM: 136 mmol/L (ref 135–145)

## 2014-11-15 MED ORDER — PEG 3350-KCL-NABCB-NACL-NASULF 236 G PO SOLR
4000.0000 mL | Freq: Once | ORAL | Status: DC
  Filled 2014-11-15 (×2): qty 4000

## 2014-11-15 MED ORDER — PEG-KCL-NACL-NASULF-NA ASC-C 100 G PO SOLR: 1.0000 | Freq: Once | ORAL | Status: DC

## 2014-11-15 MED ORDER — PEG 3350-KCL-NABCB-NACL-NASULF 236 G PO SOLR
4000.0000 mL | Freq: Once | ORAL | Status: AC
  Administered 2014-11-15: 4000 mL via ORAL
  Filled 2014-11-15: qty 4000

## 2014-11-15 NOTE — Consult Note (Signed)
Referring Provider: No ref. provider found Primary Care Physician:  Joycelyn Man, MD Primary Gastroenterologist:  [name]  Reason for Consultation:  GI bleed  HPI: Austin Padilla is a 54 y.o. male who has a PMH of ETOH abuse, anxiety/depression, HTN, hypothyroid.  Presented to [facility] hospital on 7/3 with 3 day history of diarrhea.  Was hypotensive and confused and in acute renal failure with Cr around 11.  Was found to be positive for Cdiff (was on Keflex recently).  PO vanco was started on 7/6 and PO flagyl started on 7/8.  Also on Florastor BID.  Also had some rectal bleeding with the diarrhea.  Required transfusion with 3 units PRBC's for Hgb of 6.2 grams and one unit of FFP for INR of 1.77.  Patient improved and was to be discharged on 7/14 but then had another large bloody BM.    They patient tells me that he frequently has small amounts of rectal bleeding at home described as bright red blood on the toilet paper.  The bleeding that he is having now is dripping on the floor, etc.  Says that his abdomen has been sore for the past couple of days, but no specific pain.  Hgb has been stable in the mid-8 gram range for the past 4 days.  CT scan of the abdomen and pelvis on 7/11 showed the following:   IMPRESSION: No evidence of bowel obstruction, with enteric contrast reaching the transverse colon. Moderate stool burden suggests constipation, or potentially colonic ileus.  Stigmata of cirrhosis and portal hypertension, with hepatosplenomegaly and ascites.  Cholelithiasis. If there is concern for acute biliary abnormality, recommend correlation with lab values and patient presentation.  Additional findings as above.   EGD 08/2011 by [name] showed portal hypertensive gastropathy.  Colonoscopy 03/2004 by [name] showed diverticulosis, some polyps, and an AVM.   Past Medical History  Diagnosis Date  . ANXIETY 04/25/2008    Qualifier: Diagnosis of  By: Sherren Mocha MD, Jory Ee   . DEPRESSION 04/25/2008    Qualifier: Diagnosis of  By: Sherren Mocha MD, Jory Ee   . HYPERTENSION 04/25/2008    Qualifier: Diagnosis of  By: Sherren Mocha MD, Jory Ee   . GERD 04/25/2008    Qualifier: Diagnosis of  By: Sherren Mocha MD, Jory Ee   . DIVERTICULOSIS, COLON 06/13/2009    Qualifier: Diagnosis of  By: Sherren Mocha MD, [place], PERIRECTAL 08/05/2008    Qualifier: Diagnosis of  By: Sherlynn Stalls, CMA, [place]    . ABDOMINAL PAIN, ACUTE 05/23/2009    Qualifier: Diagnosis of  By: Sherren Mocha MD, Jory Ee   . Alcoholism   . Essential tremor     Past Surgical History  Procedure Laterality Date  . Perianal abcess    . Tonsillectomy and adenoidectomy    . Esophagogastroduodenoscopy  08/27/2011    Procedure: ESOPHAGOGASTRODUODENOSCOPY (EGD);  Surgeon: Inda Castle, MD;  Location: [facility];  Service: Endoscopy;  Laterality: N/A;  See lab draw orders for pre procedure    Prior to Admission medications   Medication Sig Start Date End Date Taking? Authorizing Provider  losartan (COZAAR) 50 MG tablet TAKE 1 TABLET DAILY. 03/07/13  Yes Dorena Cookey, MD  Multiple Vitamins-Minerals (CENTRUM PO) Take 1 tablet by mouth daily.   Yes Historical Provider, MD  nadolol (CORGARD) 40 MG tablet TAKE 1 TABLET ONCE DAILY. 07/04/13  Yes Inda Castle, MD  pantoprazole (PROTONIX) 40 MG tablet TAKE 1 TABLET DAILY. 03/07/13  Yes Dorena Cookey, MD  venlafaxine XR (EFFEXOR-XR) 150 MG 24 hr capsule TAKE (1) CAPSULE TWICE DAILY. 03/07/13  Yes Dorena Cookey, MD  cephALEXin [facility]) 500 MG capsule 2 tabs twice a day Patient not taking: Reported on 11/03/2014 09/05/13   Dorena Cookey, MD  furosemide (LASIX) 40 MG tablet Take 1 tablet (40 mg total) by mouth daily. 11/14/14   Bonnielee Haff, MD  HYDROcodone-ibuprofen (VICOPROFEN) 7.5-200 MG per tablet Take 1 tablet by mouth every 6 (six) hours as needed for pain. Patient not taking: Reported on 11/03/2014 03/05/13   Laurey Morale, MD  hydrocortisone (ANUSOL-HC) 2.5 % rectal cream Place  rectally 3 (three) times daily. 11/14/14   Bonnielee Haff, MD  levothyroxine (SYNTHROID, LEVOTHROID) 50 MCG tablet Take 1 tablet (50 mcg total) by mouth daily before breakfast. 11/14/14   Bonnielee Haff, MD  magnesium oxide (MAG-OX) 400 (241.3 MG) MG tablet Take 1 tablet (400 mg total) by mouth daily. 11/14/14   Bonnielee Haff, MD  metroNIDAZOLE (FLAGYL) 500 MG tablet Take 1 tablet (500 mg total) by mouth every 8 (eight) hours. For 7 more days 11/14/14   Bonnielee Haff, MD  oxyCODONE (OXY IR/ROXICODONE) 5 MG immediate release tablet Take 1 tablet (5 mg total) by mouth every 6 (six) hours as needed for severe pain. 11/14/14   Bonnielee Haff, MD  saccharomyces boulardii (FLORASTOR) 250 MG capsule Take 1 capsule (250 mg total) by mouth 2 (two) times daily. 11/14/14   Bonnielee Haff, MD  spironolactone (ALDACTONE) 100 MG tablet Take 1 tablet (100 mg total) by mouth daily. 11/14/14   Bonnielee Haff, MD  vancomycin (VANCOCIN) 250 MG capsule Take 2 capsules (500 mg total) by mouth 4 (four) times daily. For 7 more days. 11/14/14   Bonnielee Haff, MD    Current Facility-Administered Medications  Medication Dose Route Frequency Provider Last Rate Last Dose  . 0.9 %  sodium chloride infusion  250 mL Intravenous PRN Brand Males, MD   Stopped at 11/08/14 1000  . furosemide (LASIX) tablet 40 mg  40 mg Oral Daily Verlee Monte, MD   40 mg at 11/14/14 9381  . hydrocortisone (ANUSOL-HC) 2.5 % rectal cream   Rectal TID Bonnielee Haff, MD      . levothyroxine (SYNTHROID, LEVOTHROID) tablet 100 mcg  100 mcg Oral QAC breakfast Marijean Heath, NP   100 mcg at 11/15/14 0756  . magnesium oxide (MAG-OX) tablet 400 mg  400 mg Oral Daily Veatrice Bourbon, MD   400 mg at 11/14/14 0921  . metroNIDAZOLE (FLAGYL) tablet 500 mg  500 mg Oral 3 times per day Raylene Miyamoto, MD   500 mg at 11/15/14 0443  . ondansetron (ZOFRAN) injection 4 mg  4 mg Intravenous Q6H PRN Verlee Monte, MD   4 mg at 11/12/14 1421  . oxyCODONE (Oxy  IR/ROXICODONE) immediate release tablet 5 mg  5 mg Oral Q3H PRN Wilhelmina Mcardle, MD   5 mg at 11/15/14 0443  . saccharomyces boulardii (FLORASTOR) capsule 250 mg  250 mg Oral BID Verlee Monte, MD   250 mg at 11/14/14 2324  . spironolactone (ALDACTONE) tablet 100 mg  100 mg Oral Daily Verlee Monte, MD   100 mg at 11/14/14 0921  . vancomycin (VANCOCIN) 50 mg/mL oral solution 500 mg  500 mg Oral 4 times per day Jake Church Masters, RPH   500 mg at 11/15/14 0443  . venlafaxine XR (EFFEXOR-XR) 24 hr capsule 150 mg  150 mg Oral Q  breakfast Raylene Miyamoto, MD   150 mg at 11/15/14 0756    Allergies as of 11/03/2014 - Review Complete 11/03/2014  Allergen Reaction Noted  . Metoclopramide hcl  10/14/2006    Family History  Problem Relation Age of Onset  . Stomach cancer Maternal Grandfather   . Hypertension Maternal Uncle     great uncle    History   Social History  . Marital Status: Divorced    Spouse Name: N/A  . Number of Children: 0  . Years of Education: N/A   Occupational History  . manager    Social History Main Topics  . Smoking status: Current Every Day Smoker -- 0.50 packs/day    Types: Cigarettes  . Smokeless tobacco: Never Used  . Alcohol Use: Yes     Comment: 4 per day  . Drug Use: No  . Sexual Activity: Not on file   Other Topics Concern  . Not on file   Social History Narrative    Review of Systems: Ten point ROS is O/W negative except as mentioned in HPI.  Physical Exam: Vital signs in last 24 hours: Temp:  [98.3 F (36.8 C)-98.5 F (36.9 C)] 98.4 F (36.9 C) (07/15 0535) Pulse Rate:  [107-118] 107 (07/15 0535) Resp:  [18] 18 (07/15 0535) BP: (137-157)/(77-85) 137/77 mmHg (07/15 0535) SpO2:  [96 %-98 %] 96 % (07/15 0535) Last BM Date: 11/14/14 General:  Alert, Well-developed, well-nourished, pleasant and cooperative in NAD Head:  Normocephalic and atraumatic. Eyes:  Sclera clear, no icterus.  Conjunctiva pink. Ears:  Normal auditory acuity. Mouth:   No deformity or lesions.   Lungs:  Clear throughout to auscultation.  No wheezes, crackles, or rhonchi.  Heart:  Slightly tachy.  No murmurs noted. Abdomen:  Soft, distended.  BS present.  Non-tender umbilical hernia noted.  Minimal diffuse TTP.   Rectal:  Deferred  Msk:  Symmetrical without gross deformities. Pulses:  Normal pulses noted. Extremities:  Without clubbing or edema. Neurologic:  Alert and  oriented x4;  grossly normal neurologically. Skin:  Intact without significant lesions or rashes. Psych:  Alert and cooperative. Normal mood and affect.  Intake/Output from previous day: 07/14 0701 - 07/15 0700 In: 600 [P.O.:600] Out: 500 [Urine:500]  Lab Results:  Recent Labs  11/14/14 0349  WBC 6.1  HGB 8.3*  HCT 25.6*  PLT 129*   BMET  Recent Labs  11/13/14 0326  NA 137  K 3.7  CL 101  CO2 27  GLUCOSE 126*  BUN 12  CREATININE 0.80  CALCIUM 8.6*   LFT  Recent Labs  11/13/14 0326  PROT 6.4*  ALBUMIN 2.3*  AST 94*  ALT 37  ALKPHOS 246*  BILITOT 4.1*   IMPRESSION:  -Lower GI bleed:  Acute on chronic.  Small volume bleeding, intermittently at home, but much more recently.  Had diverticulosis and AVM on last colonoscopy 11 years ago.  Rectal varices also possible. -Blood loss anemia:  Hgb stable over the past 4 days s/p 3 units PRBC's and one unit FFP. 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  Coagulopathy, mild currently; received one unit FFP on admission.  Thrombocytopenia, mild. -ETOH abuse  PLAN: -Monitor Hgb and transfuse prn. -Will plan for colonoscopy 7/16. -Continue antibiotics and Florastor for Cdiff. -ETOH cessation.  ZEHR, JESSICA D.  11/15/2014, 9:34 AM  Pager number [phone]

## 2014-11-15 NOTE — Progress Notes (Signed)
[facility] PROGRESS NOTE   Austin Padilla GLO:[id number] DOB: [birth year] DOA: 11/03/2014 PCP: Joycelyn Man, MD  Brief summary 54 year old Caucasian male with a history of alcoholism, presented with a 3 day history of bloody diarrhea. Patient was hypotensive and confused. At initial presentation with acute renal failure. Patient was admitted to the intensive care unit. He underwent renal replacement treatment. Dialysis catheter was placed. Stool came back positive for C. difficile. After stabilization, patient was transferred to the floor.  Subjective: Patient denies any abdominal pain, nausea, vomiting. He had another episode of bloody stool overnight. Denies any dizziness or lightheadedness.   Assessment/Plan: Principal Problem:   Acute renal failure Active Problems:   Alcoholic cirrhosis   Hyperkalemia   Shock circulatory   Lactic acidosis   Acute blood loss anemia   Lower GI bleed   C. difficile colitis    GI bleed Patient had a large amount of rectal bleeding last night. May have had another episode during the course of the night. Hemoglobin is surprisingly stable. GI has been consulted and we await their input. Patient did have EGD done in April 2013 showed no evidence of varices. Patient came in with anemia and what is suspected to be lower GI bleed. GI was not consulted initially as the bleeding was suspected to be either due to his C. difficile or hemorrhoids. Hemorrhoidal cream was prescribed. He did require transfusions earlier on during this hospitalization.  C. difficile colitis Positive for C. difficile, started on oral Flagyl and vancomycin. Currently stools firming up, likely to treat for total of 10-14 days.  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 Presented with hemoglobin of 6.2, patient baseline from 2 years ago as 13.4. Status post transfusion of 4 units of PRBCs.     Acute renal failure Patient presented to the hospital with creatinine of 11.2, baseline creatinine 0.9 from 2 years ago. He was admitted to the ICU, seen by nephrology. Start on CRRT, currently off of dialysis. Dialysis catheter removed. Creatinine is back to normal.  Abdominal distention Patient has some abdominal distention, KUB was done and showed possible ileus. CT scan of abdomen was done showed moderate stool burden. Mobilize. Continues to have bowel movement. Patient does not have any nausea or vomiting. He is tolerating his diet. This should resolve eventually. Abdomen is nontender.  Alcoholic cirrhosis Patient has hypoalbuminemia, hyponatremia, thrombocytopenia, anemia and ascites. No evidence of hepatic encephalopathy. Not on diuresis because of recent acute kidney injury/failure. He was started on Lasix/Aldactone.   Hypothyroidism Newly diagnosed during this hospitalization with an elevated TSH greater than 12. Free T3 was low. Patient was started on Synthroid. Will be given a prescription for same. Will need repeat testing as outpatient.   Elevated troponin On admission cardiac enzymes went up to 1.08, patient was hypotensive and had shock then. This is thought secondary to demand ischemia because of circulatory shock, also acute renal failure. 2-D echo showed normal ejection fraction.   Circulatory shock Likely secondary to lower GI bleed and acute renal failure, this is resolved.  Hyperkalemia Secondary to acute renal failure, resolved.  History of essential hypertension Blood Pressure has been stable. Since he's been started on diuretics, his ARB has been discontinued for now.   DVT prophylaxis: SCDs Code Status: Full code Family Communication: Plan discussed with the patient. Disposition Plan: Seen by PT and OT. Home health recommended. Long discussion with patient and family. Patient declines  skilled nursing facility. Plan was to discharge him on July 14. When due to  episode of rectal bleeding discharge was canceled. Await GI input.   Procedures:  Insertion of CVL on 11/04/2014.  Insertion of hemodialysis access on 11/05/2014.  Transfusion of 3 units of packed RBCs on 7/3.  Transfusion of 1 unit of FFP on 7/4.  Antibiotics:  Oral Flagyl and oral vancomycin for C. difficile colitis.   Objective: Filed Vitals:   11/15/14 0535  BP: 137/77  Pulse: 107  Temp: 98.4 F (36.9 C)  Resp: 18    Intake/Output Summary (Last 24 hours) at 11/15/14 1236 Last data filed at 11/15/14 1002  Gross per 24 hour  Intake    360 ml  Output    500 ml  Net   -140 ml   Filed Weights   11/06/14 0500 11/07/14 0355 11/08/14 0500  Weight: 116.2 kg (256 lb 2.8 oz) 116.89 kg (257 lb 11.1 oz) 115.5 kg (254 lb 10.1 oz)    Exam: General: Alert and awake, oriented x3, not in any acute distress. CVS: S1-S2 normal, regular, no murmur rubs or gallops Chest: Diminished air entry at the bases but mostly clear to auscultation, no wheezing, rales or rhonchi Abdomen: soft nontender, mildly distended. Bowel sounds normal. no organomegaly Extremities: no cyanosis, clubbing or edema noted bilaterally Neuro: Cranial nerves II-XII intact, no focal neurological deficits  Data Reviewed: Basic Metabolic Panel:  Recent Labs Lab 11/10/14 0405 11/11/14 0424 11/12/14 0327 11/13/14 0326 11/15/14 0900  NA 132* 135 133* 137 136  K 4.0 3.9 4.0 3.7 3.5  CL 97* 99* 99* 101 103  CO2 25 28 24 27 25   GLUCOSE 82 92 89 126* 97  BUN 26* 20 16 12 8   CREATININE 0.93 0.82 0.79 0.80 0.68  CALCIUM 8.0* 8.6* 8.5* 8.6* 8.1*  MG 1.4* 2.0  --   --   --    Liver Function Tests:  Recent Labs Lab 11/09/14 0308 11/10/14 0405 11/11/14 0424 11/12/14 0327 11/13/14 0326  AST 109* 107* 105* 98* 94*  ALT 40 38 39 37 37  ALKPHOS 232* 248* 262* 244* 246*  BILITOT 3.7* 4.3* 5.5* 4.4* 4.1*  PROT 6.0* 6.2* 6.4* 6.2* 6.4*  ALBUMIN 2.3* 2.3* 2.4* 2.3* 2.3*   CBC:  Recent Labs Lab  11/10/14 0405 11/11/14 1200 11/12/14 0923 11/14/14 0349 11/15/14 0900  WBC 5.6 5.8 5.5 6.1 5.3  HGB 7.3* 8.4* 8.3* 8.3* 8.5*  HCT 21.9* 25.1* 25.0* 25.6* 25.9*  MCV 100.9* 97.7 98.0 99.6 100.4*  PLT 76* 97* 102* 129* 138*    Micro Recent Results (from the past 240 hour(s))  Clostridium Difficile by PCR (not at [facility])     Status: Abnormal   Collection Time: 11/06/14  9:00 AM  Result Value Ref Range Status   C difficile by pcr POSITIVE (A) NEGATIVE Final    Comment: CRITICAL RESULT CALLED TO, READ BACK BY AND VERIFIED WITH: Christain Sacramento RN 12:15 11/06/14 ([email or web address])      Studies: No results found.  Scheduled Meds: . furosemide  40 mg Oral Daily  . hydrocortisone   Rectal TID  . levothyroxine  100 mcg Oral QAC breakfast  . magnesium oxide  400 mg Oral Daily  . metroNIDAZOLE  500 mg Oral 3 times per day  . saccharomyces boulardii  250 mg Oral BID  . spironolactone  100 mg Oral Daily  . vancomycin  500 mg Oral 4 times per day  . venlafaxine XR  150 mg  Oral Q breakfast   Continuous Infusions:    [facility]  [facility] Pager [phone]   If 7PM-7AM, please contact night-coverage at [email or web address], password [facility] 11/15/2014, 12:36 PM  LOS: 12 days

## 2014-11-16 LAB — CBC
HCT: 26.9 % — ABNORMAL LOW (ref 39.0–52.0)
Hemoglobin: 8.7 g/dL — ABNORMAL LOW (ref 13.0–17.0)
MCH: 32.1 pg (ref 26.0–34.0)
MCHC: 32.3 g/dL (ref 30.0–36.0)
MCV: 99.3 fL (ref 78.0–100.0)
PLATELETS: 168 10*3/uL (ref 150–400)
RBC: 2.71 MIL/uL — ABNORMAL LOW (ref 4.22–5.81)
RDW: 18.8 % — AB (ref 11.5–15.5)
WBC: 4.8 10*3/uL (ref 4.0–10.5)

## 2014-11-16 MED ORDER — NADOLOL 40 MG PO TABS
40.0000 mg | ORAL_TABLET | Freq: Every day | ORAL | Status: DC
  Administered 2014-11-16 – 2014-11-18 (×3): 40 mg via ORAL
  Filled 2014-11-16 (×3): qty 1

## 2014-11-16 MED ORDER — MIDAZOLAM HCL 5 MG/ML IJ SOLN
INTRAMUSCULAR | Status: AC
  Filled 2014-11-16: qty 2

## 2014-11-16 MED ORDER — DIPHENHYDRAMINE HCL 50 MG/ML IJ SOLN
INTRAMUSCULAR | Status: AC
  Filled 2014-11-16: qty 1

## 2014-11-16 MED ORDER — FENTANYL CITRATE (PF) 100 MCG/2ML IJ SOLN
INTRAMUSCULAR | Status: AC
  Filled 2014-11-16: qty 2

## 2014-11-16 MED ORDER — SODIUM CHLORIDE 0.9 % IV SOLN
INTRAVENOUS | Status: DC
  Administered 2014-11-16 (×2): via INTRAVENOUS

## 2014-11-16 NOTE — Plan of Care (Signed)
Problem: Phase II Progression Outcomes Goal: Progress activity as tolerated unless otherwise ordered Outcome: Completed/Met Date Met:  11/16/14 Up in chair today.

## 2014-11-16 NOTE — Progress Notes (Signed)
[facility] PROGRESS NOTE   Austin Padilla OZH:[id number] DOB: 03-Oct-1960 DOA: 11/03/2014 PCP: Joycelyn Man, MD  Brief summary 54 year old Caucasian male with a history of alcoholism, presented with a 3 day history of bloody diarrhea. Patient was hypotensive and confused. At initial presentation with acute renal failure. Patient was admitted to the intensive care unit. He underwent renal replacement treatment. Dialysis catheter was placed. Stool came back positive for C. difficile. After stabilization, patient was transferred to the floor.  Subjective: Patient had difficulty finishing his colonoscopy prep last night. Unable to tell me if he had any bleeding overnight, although there are no nursing reports of same. No nausea or vomiting.   Assessment/Plan: Principal Problem:   Acute renal failure Active Problems:   Alcoholic cirrhosis   Hyperkalemia   Shock circulatory   Lactic acidosis   Acute blood loss anemia   Lower GI bleed   C. difficile colitis    GI bleed No further recurrence of rectal bleeding. Appreciate GI input. Plan is for colonoscopy. This was planned for today, but the patient did not complete his prep. Await GI input this morning. Hemoglobin is stable. Patient did have EGD done in April 2013 showed no evidence of varices. Patient came in with anemia and what is suspected to be lower GI bleed. GI was not consulted initially as the bleeding was suspected to be either due to his C. difficile or hemorrhoids. Hemorrhoidal cream was prescribed. He did require transfusions earlier on during this hospitalization.  C. difficile colitis Continue oral Flagyl and vancomycin. Currently stools firming up, likely to treat for total of 10-14 days. Complete course on July 24.  Anemia secondary to acute blood loss Anemia of chronic disease secondary to hepatic cirrhosis exacerbated by acute blood loss secondary to lower GI bleed. Presented with hemoglobin of 6.2, patient  baseline from 2 years ago as 13.4. Status post transfusion of 4 units of PRBCs.   Acute renal failure Patient presented to the hospital with creatinine of 11.2, baseline creatinine 0.9 from 2 years ago. He was admitted to the ICU, seen by nephrology. Start on CRRT, currently off of dialysis. Dialysis catheter removed. Creatinine is back to normal.  Abdominal distention Patient has some abdominal distention, KUB was done and showed possible ileus. CT scan of abdomen was done showed moderate stool burden. Mobilize. Continues to have bowel movement. Patient does not have any nausea or vomiting. He is tolerating his diet. This should resolve eventually. Abdomen is nontender.  Alcoholic cirrhosis Patient has hypoalbuminemia, hyponatremia, thrombocytopenia, anemia and ascites. No evidence of hepatic encephalopathy. He was started on Lasix/Aldactone.   Hypothyroidism Newly diagnosed during this hospitalization with an elevated TSH greater than 12. Free T3 was low. Patient was started on Synthroid. Will be given a prescription for same. Will need repeat testing as outpatient.   Elevated troponin On admission cardiac enzymes went up to 1.08, patient was hypotensive and had shock then. This is thought secondary to demand ischemia because of circulatory shock, also acute renal failure. 2-D echo showed normal ejection fraction.   Circulatory shock Likely secondary to lower GI bleed and acute renal failure, this is resolved.  Hyperkalemia Secondary to acute renal failure, resolved.  History of essential hypertension Blood Pressure has been stable. Since he's been started on diuretics, his ARB was discontinued.   DVT prophylaxis: SCDs Code Status: Full code Family Communication: Discussed with patient. Disposition Plan: Seen by PT and OT. Home health recommended. Long discussion with patient and family. Patient  declines skilled nursing facility. Plan was to discharge him on July 14, when due to  episode of rectal bleeding discharge was canceled. Plan is for colonoscopy as an inpatient.   Procedures:  Insertion of CVL on 11/04/2014.  Insertion of hemodialysis access on 11/05/2014.  Transfusion of 4 units of packed RBCs.  Transfusion of 1 unit of FFP on 7/4.  Antibiotics:  Oral Flagyl and oral vancomycin for C. difficile colitis.   Objective: Filed Vitals:   11/16/14 0432  BP: 144/82  Pulse: 109  Temp: 98.5 F (36.9 C)  Resp: 17   No intake or output data in the 24 hours ending 11/16/14 1230 Filed Weights   11/06/14 0500 11/07/14 0355 11/08/14 0500  Weight: 116.2 kg (256 lb 2.8 oz) 116.89 kg (257 lb 11.1 oz) 115.5 kg (254 lb 10.1 oz)    Exam: General: Alert and awake, oriented x3, not in any acute distress. CVS: S1-S2 normal, regular, no murmur rubs or gallops Chest: Diminished air entry at the bases but mostly clear to auscultation, no wheezing, rales or rhonchi Abdomen: soft nontender, mildly distended. Bowel sounds normal. no organomegaly. No masses. Extremities: no cyanosis, clubbing or edema noted bilaterally Neuro: Cranial nerves II-XII intact, no focal neurological deficits  Data Reviewed: Basic Metabolic Panel:  Recent Labs Lab 11/10/14 0405 11/11/14 0424 11/12/14 0327 11/13/14 0326 11/15/14 0900  NA 132* 135 133* 137 136  K 4.0 3.9 4.0 3.7 3.5  CL 97* 99* 99* 101 103  CO2 25 28 24 27 25   GLUCOSE 82 92 89 126* 97  BUN 26* 20 16 12 8   CREATININE 0.93 0.82 0.79 0.80 0.68  CALCIUM 8.0* 8.6* 8.5* 8.6* 8.1*  MG 1.4* 2.0  --   --   --    Liver Function Tests:  Recent Labs Lab 11/10/14 0405 11/11/14 0424 11/12/14 0327 11/13/14 0326  AST 107* 105* 98* 94*  ALT 38 39 37 37  ALKPHOS 248* 262* 244* 246*  BILITOT 4.3* 5.5* 4.4* 4.1*  PROT 6.2* 6.4* 6.2* 6.4*  ALBUMIN 2.3* 2.4* 2.3* 2.3*   CBC:  Recent Labs Lab 11/11/14 1200 11/12/14 0923 11/14/14 0349 11/15/14 0900 11/16/14 1057  WBC 5.8 5.5 6.1 5.3 4.8  HGB 8.4* 8.3* 8.3* 8.5*  8.7*  HCT 25.1* 25.0* 25.6* 25.9* 26.9*  MCV 97.7 98.0 99.6 100.4* 99.3  PLT 97* 102* 129* 138* 168     Studies: No results found.  Scheduled Meds: . furosemide  40 mg Oral Daily  . hydrocortisone   Rectal TID  . levothyroxine  100 mcg Oral QAC breakfast  . magnesium oxide  400 mg Oral Daily  . metroNIDAZOLE  500 mg Oral 3 times per day  . nadolol  40 mg Oral Daily  . polyethylene glycol  4,000 mL Oral Once  . saccharomyces boulardii  250 mg Oral BID  . spironolactone  100 mg Oral Daily  . vancomycin  500 mg Oral 4 times per day  . venlafaxine XR  150 mg Oral Q breakfast   Continuous Infusions: . sodium chloride 20 mL/hr at 11/16/14 [facility] Pager [phone]   If 7PM-7AM, please contact night-coverage at [email or web address], password [email or web address] 11/16/2014, 12:30 PM  LOS: 13 days

## 2014-11-16 NOTE — Progress Notes (Signed)
Cross cover [facility] Subjective: Patient was supposed to have a colonoscopy today that was cancelled as he did not drink his prep. He insists that the prep is causing him to have severe abdominal pain and he wasnts "pain meds" for this. No nausea or vomiting.   Objective: Vital signs in last 24 hours: Temp:  [98.1 F (36.7 C)-98.5 F (36.9 C)] 98.5 F (36.9 C) (07/16 0432) Pulse Rate:  [101-114] 109 (07/16 0432) Resp:  [17-18] 17 (07/16 0432) BP: (141-147)/(80-88) 144/82 mmHg (07/16 0432) SpO2:  [95 %-98 %] 97 % (07/16 0432) Last BM Date: 11/15/14  General appearance: alert, cooperative, appears stated age and no distress Resp: clear to auscultation bilaterally Cardio: regular rate and rhythm, S1, S2 normal, no murmur, click, rub or gallop GI: soft, obese, non-tender; bowel sounds normal; no masses,  no organomegaly  Lab Results:  Recent Labs  11/14/14 0349 11/15/14 0900  WBC 6.1 5.3  HGB 8.3* 8.5*  HCT 25.6* 25.9*  PLT 129* 138*   BMET  Recent Labs  11/15/14 0900  NA 136  K 3.5  CL 103  CO2 25  GLUCOSE 97  BUN 8  CREATININE 0.68  CALCIUM 8.1*   Medications: I have reviewed the patient's current medications.  Assessment/Plan: 1) Anemia with lower GI bleeding: in a 54 year old alcoholic with a history of diverticulosis and colonic polyps and AVM noted in 2005. Patient was encouraged to complete his prep so that a colonoscopy can be done tomorrow.  2) Alcoholic cirrhosis with PHG and ascites with hepatosplenomegaly. 3) C. Difficile colitis on Vancomycin and Flagyl.  4) GERD.  LOS: 13 days   Rileyann Florance 11/16/2014, 10:05 AM

## 2014-11-17 ENCOUNTER — Encounter (HOSPITAL_COMMUNITY)
Admission: EM | Disposition: A | Discharge status: Home Health | Payer: Self-pay | Source: Home / Self Care | Attending: Internal Medicine

## 2014-11-17 HISTORY — PX: COLONOSCOPY: SHX5424

## 2014-11-17 LAB — BASIC METABOLIC PANEL
Anion gap: 9 (ref 5–15)
BUN: 6 mg/dL (ref 6–20)
CALCIUM: 8.1 mg/dL — AB (ref 8.9–10.3)
CO2: 23 mmol/L (ref 22–32)
CREATININE: 0.67 mg/dL (ref 0.61–1.24)
Chloride: 98 mmol/L — ABNORMAL LOW (ref 101–111)
GFR calc Af Amer: >60 mL/min (ref >60)
GFR calc non Af Amer: >60 mL/min (ref >60)
GLUCOSE: 85 mg/dL (ref 65–99)
Potassium: 3.7 mmol/L (ref 3.5–5.1)
Sodium: 130 mmol/L — ABNORMAL LOW (ref 135–145)

## 2014-11-17 LAB — CBC
HCT: 29.1 % — ABNORMAL LOW (ref 39.0–52.0)
Hemoglobin: 9.6 g/dL — ABNORMAL LOW (ref 13.0–17.0)
MCH: 32.4 pg (ref 26.0–34.0)
MCHC: 33 g/dL (ref 30.0–36.0)
MCV: 98.3 fL (ref 78.0–100.0)
PLATELETS: 184 10*3/uL (ref 150–400)
RBC: 2.96 MIL/uL — ABNORMAL LOW (ref 4.22–5.81)
RDW: 18.8 % — ABNORMAL HIGH (ref 11.5–15.5)
WBC: 6 10*3/uL (ref 4.0–10.5)

## 2014-11-17 SURGERY — COLONOSCOPY
Anesthesia: Moderate Sedation

## 2014-11-17 MED ORDER — DIPHENHYDRAMINE HCL 50 MG/ML IJ SOLN
INTRAMUSCULAR | Status: AC
  Filled 2014-11-17: qty 1

## 2014-11-17 MED ORDER — FENTANYL CITRATE (PF) 100 MCG/2ML IJ SOLN
INTRAMUSCULAR | Status: DC | PRN
  Administered 2014-11-17 (×3): 25 ug via INTRAVENOUS

## 2014-11-17 MED ORDER — FENTANYL CITRATE (PF) 100 MCG/2ML IJ SOLN
INTRAMUSCULAR | Status: AC
  Filled 2014-11-17: qty 2

## 2014-11-17 MED ORDER — DIPHENHYDRAMINE HCL 50 MG/ML IJ SOLN
INTRAMUSCULAR | Status: DC | PRN
  Administered 2014-11-17 (×2): 25 mg via INTRAVENOUS

## 2014-11-17 MED ORDER — MIDAZOLAM HCL 5 MG/5ML IJ SOLN
INTRAMUSCULAR | Status: DC | PRN
  Administered 2014-11-17 (×3): 2 mg via INTRAVENOUS

## 2014-11-17 MED ORDER — MIDAZOLAM HCL 5 MG/ML IJ SOLN
INTRAMUSCULAR | Status: AC
  Filled 2014-11-17: qty 2

## 2014-11-17 NOTE — Plan of Care (Signed)
Problem: Phase I Progression Outcomes Goal: Initial discharge plan identified Outcome: Completed/Met Date Met:  11/17/14 Possible discharge tomorrow per MD note.

## 2014-11-17 NOTE — Progress Notes (Signed)
[facility] PROGRESS NOTE   Austin Padilla KJZ:[id number] DOB: [birth year] DOA: 11/03/2014 PCP: Joycelyn Man, MD  Brief summary 54 year old Caucasian male with a history of alcoholism, presented with a 3 day history of bloody diarrhea. Patient was hypotensive and confused. At initial presentation with acute renal failure. Patient was admitted to the intensive care unit. He underwent renal replacement treatment. Dialysis catheter was placed. Stool came back positive for C. difficile. After stabilization, patient was transferred to the floor.  Subjective: Patient was able to finish his prep last night. Waiting to be taken down for his colonoscopy. Denies any abdominal pain, nausea or vomiting. No further recurrence of bleeding.   Assessment/Plan: Principal Problem:   Acute renal failure Active Problems:   Alcoholic cirrhosis   Hyperkalemia   Shock circulatory   Lactic acidosis   Acute blood loss anemia   Lower GI bleed   C. difficile colitis    GI bleed No further recurrence of rectal bleeding. Appreciate GI input. Plan is for colonoscopy today. Hemoglobin is stable. Patient did have EGD done in April 2013 showed no evidence of varices. Patient came in with anemia and what is suspected to be lower GI bleed. GI was not consulted initially as the bleeding was suspected to be either due to his C. difficile or hemorrhoids. Hemorrhoidal cream was prescribed. He did require transfusions earlier on during this hospitalization.  C. difficile colitis Continue oral Flagyl and vancomycin. Complete course on July 27.  Anemia secondary to acute blood loss Anemia of chronic disease secondary to hepatic cirrhosis exacerbated by acute blood loss secondary to lower GI bleed. Presented with hemoglobin of 6.2, patient baseline from 2 years ago as 13.4. Status post transfusion of 4 units of PRBCs. Hemoglobin is now stable.  Acute renal failure Patient presented to the hospital with  creatinine of 11.2, baseline creatinine 0.9 from 2 years ago. He was admitted to the ICU, seen by nephrology. Start on CRRT, currently off of dialysis. Dialysis catheter removed. Creatinine is back to normal.  Abdominal distention Patient has some abdominal distention, KUB was done and showed possible ileus. CT scan of abdomen was done showed moderate stool burden. Mobilize. Continues to have bowel movement. Patient does not have any nausea or vomiting. He is tolerating his diet. Abdomen is less distended.  Alcoholic cirrhosis Patient has hypoalbuminemia, hyponatremia, thrombocytopenia, anemia and ascites. No evidence of hepatic encephalopathy. He was started on Lasix/Aldactone.   Hypothyroidism Newly diagnosed during this hospitalization with an elevated TSH greater than 12. Free T3 was low. Patient was started on Synthroid. Will be given a prescription for same. Will need repeat testing as outpatient.   Elevated troponin On admission cardiac enzymes went up to 1.08, patient was hypotensive and had shock then. This is thought secondary to demand ischemia because of circulatory shock, also acute renal failure. 2-D echo showed normal ejection fraction.   Circulatory shock Likely secondary to lower GI bleed and acute renal failure, this is resolved.  Hyperkalemia Secondary to acute renal failure, resolved.  History of essential hypertension Blood Pressure has been stable. Since he's been started on diuretics, his ARB was discontinued.   DVT prophylaxis: SCDs Code Status: Full code Family Communication: Discussed with patient. Disposition Plan: Seen by PT and OT. Home health recommended. Long discussion with patient and family. Patient declines skilled nursing facility. Plan was to discharge him on July 14, when due to episode of rectal bleeding discharge was canceled. Plan is for colonoscopy as an inpatient today. Anticipate  discharge tomorrow.   Procedures:  Insertion of CVL on  11/04/2014.  Insertion of hemodialysis access on 11/05/2014.  Transfusion of 4 units of packed RBCs.  Transfusion of 1 unit of FFP on 7/4.  Antibiotics:  Oral Flagyl and oral vancomycin for C. difficile colitis.   Objective: Filed Vitals:   11/17/14 0658  BP: 124/78  Pulse: 91  Temp: 98.1 F (36.7 C)  Resp: 18   No intake or output data in the 24 hours ending 11/17/14 0928 Filed Weights   11/06/14 0500 11/07/14 0355 11/08/14 0500  Weight: 116.2 kg (256 lb 2.8 oz) 116.89 kg (257 lb 11.1 oz) 115.5 kg (254 lb 10.1 oz)    Exam: General: Alert and awake, oriented x3, not in any acute distress. CVS: S1-S2 normal, regular, no murmur rubs or gallops Chest: Diminished air entry at the bases but mostly clear to auscultation, no wheezing, rales or rhonchi Abdomen: soft nontender, mildly distended, but less than before. Bowel sounds normal. no organomegaly. No masses. Extremities: no cyanosis, clubbing or edema noted bilaterally Neuro: Cranial nerves II-XII intact, no focal neurological deficits  Data Reviewed: Basic Metabolic Panel:  Recent Labs Lab 11/11/14 0424 11/12/14 0327 11/13/14 0326 11/15/14 0900 11/17/14 0423  NA 135 133* 137 136 130*  K 3.9 4.0 3.7 3.5 3.7  CL 99* 99* 101 103 98*  CO2 28 24 27 25 23   GLUCOSE 92 89 126* 97 85  BUN 20 16 12 8 6   CREATININE 0.82 0.79 0.80 0.68 0.67  CALCIUM 8.6* 8.5* 8.6* 8.1* 8.1*  MG 2.0  --   --   --   --    Liver Function Tests:  Recent Labs Lab 11/11/14 0424 11/12/14 0327 11/13/14 0326  AST 105* 98* 94*  ALT 39 37 37  ALKPHOS 262* 244* 246*  BILITOT 5.5* 4.4* 4.1*  PROT 6.4* 6.2* 6.4*  ALBUMIN 2.4* 2.3* 2.3*   CBC:  Recent Labs Lab 11/12/14 0923 11/14/14 0349 11/15/14 0900 11/16/14 1057 11/17/14 0423  WBC 5.5 6.1 5.3 4.8 6.0  HGB 8.3* 8.3* 8.5* 8.7* 9.6*  HCT 25.0* 25.6* 25.9* 26.9* 29.1*  MCV 98.0 99.6 100.4* 99.3 98.3  PLT 102* 129* 138* 168 184     Studies: No results found.  Scheduled  Meds: . furosemide  40 mg Oral Daily  . hydrocortisone   Rectal TID  . levothyroxine  100 mcg Oral QAC breakfast  . magnesium oxide  400 mg Oral Daily  . metroNIDAZOLE  500 mg Oral 3 times per day  . nadolol  40 mg Oral Daily  . polyethylene glycol  4,000 mL Oral Once  . saccharomyces boulardii  250 mg Oral BID  . spironolactone  100 mg Oral Daily  . vancomycin  500 mg Oral 4 times per day  . venlafaxine XR  150 mg Oral Q breakfast   Continuous Infusions: . sodium chloride 20 mL/hr at 11/16/14 [place] [facility] Pager [phone]   If 7PM-7AM, please contact night-coverage at [email or web address], password [facility] 11/17/2014, 9:28 AM  LOS: 14 days

## 2014-11-17 NOTE — Op Note (Signed)
[facility] [place], [id number]   OPERATIVE PROCEDURE REPORT  PATIENT: Austin Padilla, Austin Padilla  MR#: [id number] BIRTHDATE: 12/04/1960 GENDER: male ENDOSCOPIST: Edmonia James, MD ASSISTANT:   Mariana Single, RN & Cristopher Estimable, technician. PROCEDURE DATE: 11-19-2014 PRE-PROCEDURE PREPARATION: Patient fasted for 4 hours prior to procedure. The patient was prepped with a gallon of Golytely the night prior to the procedure.  The patient has fasted for 4 hours prior to the procedure PRE-PROCEDURE PHYSICAL: Patient has stable vital signs.  Neck is supple.  There is no JVD, thyromegaly or LAD.  Chest clear to auscultation.  S1 and S2 regular.  Abdomen soft, obese, non-distended, non-tender with NABS. PROCEDURE:     Colonoscopy with cold biopsies x 5 and hot snare polypectomy x 1. ASA CLASS:     Class IV INDICATIONS:     1.  Rectal bleeding  2.  Iron deficiency anemia 3. Personal history of colonic polyps 4. CRC screening. MEDICATIONS:     Benadryl 25 mg, Fentanyl 75 mcg and Versed 6 mg IV.   DESCRIPTION OF PROCEDURE: After the risks, benefits, and alternatives of the procedure were thoroughly explained [including a 10% missed rate of cancer and polyps], informed consent was obtained. Digital rectal exam was performed. The [employer] Adult Colon [id number]  was introduced through the anus  and advanced to the terminal ileum which was intubated for a short distance , limited by No adverse events experienced. The quality of the prep was good. Multiple washes were done. Small lesions could be missed. The instrument was then slowly withdrawn as the colon was fully examined. Estimated blood loss is zero unless otherwise noted in this procedure report.     COLON FINDINGS: One 7 mm sessile polyp was found in the proximal descending colon; a hot snare polypectomy was performed x 1-200/20; the resection was complete, the polyp tissue was completely retrieved  and sent to histology. 2 small sessile polyps were ablated with the tip of the snare in the left colon as well. 5 dimunitive polyps were removed by cold biopsies from the left colon as well.  The rest of the colonic mucosa appeared healthy with a normal vascular pattern. No masses, diverticula or AVMs were noted. The appendiceal orifice and the ICV were identified and photographed.  The terminal ileum appeared normal.  Retroflexed views revealed prominent internal hemorrhoids. The patient tolerated the procedure without immediate complications. The scope was then withdrawn from the patient and the procedure terminated.  TIME TO CECUM:   13 minutes 00 seconds WITHDRAW TIME:  07 minutes 00 seconds  IMPRESSION:     Multiple left colon polyps removed-see description above; moderate sized internal hemorrhoids; otherwise normal colonoscopy upto the terminal 'ileum.  RECOMMENDATIONS:     1.  Hold Aspirin and all other NSAIDS for 2 weeks. 2.  Await pathology results 3.  Continue current medications. 4.  Continue surveillance. 5.  High fiber diet with liberal fluid intake. 6. Start with full liquid diet and advance as tolerated.  REPEAT EXAM:      In 5 years  for a repeat colonoscopy.  If the patient has any abnormal GI symptoms in the interim, he have been advised to contact the office as soon as possible for further recommendations.   REFERRED BY: THP eSigned:  Edmonia James, MD 11/19/14 11:39 AM  CPT CODES:     61950 Colonoscopy, flexible, proximal to splenic flexure; with removal of tumor(s), polyp(s), or other lesion(s)  by snare technique ICD CODES:     K62.5 Hemorrhage of anus and rectum D50.9 Iron deficiency anemia,unspecified Z86.010, Personal history of colonic polyps; Z12.11 CRC screening  The ICD and CPT codes recommended by this software are interpretations from the data that the clinical staff has captured with the software.  The verification of the translation of  this report to the ICD and CPT codes and modifiers is the sole responsibility of the health care institution and practicing physician where this report was generated.  [employer] will not be held responsible for the validity of the ICD and CPT codes included on this report.  AMA assumes no liability for data contained or not contained herein. CPT is a Designer, television/film set of the [employer].  PATIENT NAME:  Wilford, [place] MR#: [id number]

## 2014-11-18 ENCOUNTER — Encounter: Payer: Self-pay | Admitting: Gastroenterology

## 2014-11-18 ENCOUNTER — Encounter (HOSPITAL_COMMUNITY): Payer: Self-pay | Admitting: Gastroenterology

## 2014-11-18 LAB — CBC
HCT: 26.2 % — ABNORMAL LOW (ref 39.0–52.0)
HEMOGLOBIN: 8.5 g/dL — AB (ref 13.0–17.0)
MCH: 32.2 pg (ref 26.0–34.0)
MCHC: 32.4 g/dL (ref 30.0–36.0)
MCV: 99.2 fL (ref 78.0–100.0)
PLATELETS: 234 10*3/uL (ref 150–400)
RBC: 2.64 MIL/uL — AB (ref 4.22–5.81)
RDW: 18.6 % — ABNORMAL HIGH (ref 11.5–15.5)
WBC: 5.7 10*3/uL (ref 4.0–10.5)

## 2014-11-18 MED ORDER — GI COCKTAIL ~~LOC~~
30.0000 mL | Freq: Once | ORAL | Status: AC
  Administered 2014-11-18: 30 mL via ORAL
  Filled 2014-11-18: qty 30

## 2014-11-18 NOTE — Care Management Note (Signed)
Case Management Note  Patient Details  Name: Austin Padilla MRN: [id number] Date of Birth: Oct 02, 1960  Subjective/Objective:                    Action/Plan: [place] letter given and explained . Patient states he already has PCP , gave patient [facility] information in case needed in future .   Expected Discharge Date:                  Expected Discharge Plan:  [place]  In-House Referral:  Clinical Social Work  Discharge planning Services  CM Consult  Post Acute Care Choice:  Home Health Choice offered to:     DME Arranged:    DME Agency:     HH Arranged:  RN, OT, PT [place] Agency:  [place]  Status of Service:  Completed, signed off  Medicare Important Message Given:    Date Medicare IM Given:    Medicare IM give by:    Date Additional Medicare IM Given:    Additional Medicare Important Message give by:     If discussed at [place] of Stay Meetings, dates discussed:    Additional Comments:  Marilu Favre, RN 11/18/2014, 12:24 PM

## 2014-11-18 NOTE — Discharge Summary (Signed)
[facility]  Physician Discharge Summary   Patient ID: Austin Padilla MRN: [id number] DOB/AGE: Oct 24, 1960 54 y.o.  Admit date: 11/03/2014 Discharge date: 11/18/2014  PCP: Joycelyn Man, MD  DISCHARGE DIAGNOSES:  Principal Problem:   Acute renal failure Active Problems:   Alcoholic cirrhosis   Hyperkalemia   Shock circulatory   Lactic acidosis   Acute blood loss anemia   Lower GI bleed   C. difficile colitis   RECOMMENDATIONS FOR OUTPATIENT FOLLOW UP: 1. Repeat thyroid function tests in 3-4 weeks. 2. Patient started on diuretics as well. He'll need follow-up for same. 3. Home health ordered.  DISCHARGE CONDITION: fair  Diet recommendation: Low sodium  Filed Weights   11/06/14 0500 11/07/14 0355 11/08/14 0500  Weight: 116.2 kg (256 lb 2.8 oz) 116.89 kg (257 lb 11.1 oz) 115.5 kg (254 lb 10.1 oz)    INITIAL HISTORY: 54 year old Caucasian male with a history of alcoholism, presented with a 3 day history of bloody diarrhea. Patient was hypotensive and confused. At initial presentation with acute renal failure. Patient was admitted to the intensive care unit. He underwent renal replacement treatment. Dialysis catheter was placed. Stool came back positive for C. difficile. After stabilization, patient was transferred to the floor.  Consultations:  Gastroenterology  Procedures:  Insertion of CVL on 11/04/2014.  Insertion of hemodialysis access on 11/05/2014. Removed subsequently  Transfusion of 4 units of packed RBCs.  Transfusion of 1 unit of FFP on 7/4.  2-D echocardiogram Study Conclusions - Left ventricle: The cavity size was normal. Wall thickness wasnormal. Systolic function was normal. The estimated ejectionfraction was in the range of 60% to 65%. Abnormal diastolicfunction, indeterminate grade. - Aortic valve: Mildly calcified annulus. Trileaflet; mildlythickened leaflets. Valve area (VTI): 3.67 cm^2. Valve area(Vmax): 3.36 cm^2. - Mitral  valve: Mildly calcified annulus. Mildly thickened leaflets. There was mild to moderate regurgitation. The jet is eccentricand posterior directed, this may lead to underestimation ofseverity. - Left atrium: The atrium was severely dilated. - Right ventricle: The cavity size was mildly dilated. - Right atrium: The atrium was severely dilated. - Technically difficult study.  Colonoscopy July 17 IMPRESSION: Multiple left colon polyps removed-see description above; moderate sized internal hemorrhoids; otherwise normal colonoscopy upto the terminal 'ileum.  HOSPITAL COURSE:   GI bleed Patient was supposed be discharged on July 14. However, that afternoon he had a large episode of rectal bleeding. His discharge was canceled and GI was consulted. Earlier. Plan was for outpatient GI follow-up. Patient was seen by gastroenterology. He underwent colonoscopy which showed multiple colonic polyps which were removed. Moderate sized internal hemorrhoids were noted, which likely was the reason for his bleeding. Hemoglobin has been stable. He hasn't had any further recurrence of bleeding. Patient did have EGD done in April 2013 showed no evidence of varices. Patient came in with anemia and what is suspected to be lower GI bleed. GI was not consulted initially as the bleeding was suspected to be either due to his C. difficile or hemorrhoids. Hemorrhoidal cream will be prescribed. He is followed by [name] with gastroenterology.   C. difficile colitis Positive for C. difficile, started on oral Flagyl and vancomycin. Treat for 7 more days.  Acute renal failure Patient presented to the hospital with creatinine of 11.2, baseline creatinine 0.9 from 2 years ago. He was admitted to the ICU, seen by nephrology. Started on CRRT, currently off of dialysis. Dialysis catheter removed. Creatinine is back to normal.  Abdominal distention Patient has some abdominal distention, KUB was  done and showed possible ileus. CT  scan of abdomen was done showed moderate stool burden. Mobilize. Continues to have bowel movement. Patient does not have any nausea or vomiting. He is tolerating his diet. This should resolve eventually. Abdomen is nontender.  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 Presented with hemoglobin of 6.2, patient baseline from 2 years ago as 13.4. Status post transfusion of 4 units of PRBCs. Hemoglobin has been stable the last few days.  Alcoholic cirrhosis Patient has hypoalbuminemia, hyponatremia, thrombocytopenia, anemia and ascites. No evidence of hepatic encephalopathy. Not on diuresis because of recent acute kidney injury/failure. He was started on Lasix/Aldactone.   Hypothyroidism Newly diagnosed during this hospitalization with an elevated TSH greater than 12. Free T3 was low. Patient was started on Synthroid. Will be given a prescription for same. Will need repeat testing as outpatient.   Elevated troponin On admission cardiac enzymes went up to 1.08, patient was hypotensive and had shock then. This is thought secondary to demand ischemia because of circulatory shock, also acute renal failure. 2-D echo showed normal ejection fraction.   Circulatory shock Likely secondary to lower GI bleed and acute renal failure, this is resolved.  Hyperkalemia Secondary to acute renal failure, resolved.  History of essential hypertension Blood Pressure has been stable. Since he's been started on diuretics, his ARB has been discontinued for now.  Patient does not want to go to skilled nursing facility. He wants to go home. See my note from July 14. Will be discharged today. Home health has been arranged.   PERTINENT LABS:  The results of significant diagnostics from this hospitalization (including imaging, microbiology, ancillary and laboratory) are listed below for reference.     Microbiology:   Recent Results (from the past 240 hour(s))  Clostridium Difficile by PCR (not at [facility]) Status: Abnormal   Collection Time: 11/06/14 9:00 AM  Result Value Ref Range Status   C difficile by pcr POSITIVE (A) NEGATIVE Final    Comment: CRITICAL RESULT CALLED TO, READ BACK BY AND VERIFIED WITH: Christain Sacramento RN 12:15 11/06/14 ([email or web address])          Labs: Basic Metabolic Panel:  Recent Labs Lab 11/12/14 0327 11/13/14 0326 11/15/14 0900 11/17/14 0423  NA 133* 137 136 130*  K 4.0 3.7 3.5 3.7  CL 99* 101 103 98*  CO2 24 27 25 23   GLUCOSE 89 126* 97 85  BUN 16 12 8 6   CREATININE 0.79 0.80 0.68 0.67  CALCIUM 8.5* 8.6* 8.1* 8.1*   Liver Function Tests:  Recent Labs Lab 11/12/14 0327 11/13/14 0326  AST 98* 94*  ALT 37 37  ALKPHOS 244* 246*  BILITOT 4.4* 4.1*  PROT 6.2* 6.4*  ALBUMIN 2.3* 2.3*   CBC:  Recent Labs Lab 11/14/14 0349 11/15/14 0900 11/16/14 1057 11/17/14 0423 11/18/14 0424  WBC 6.1 5.3 4.8 6.0 5.7  HGB 8.3* 8.5* 8.7* 9.6* 8.5*  HCT 25.6* 25.9* 26.9* 29.1* 26.2*  MCV 99.6 100.4* 99.3 98.3 99.2  PLT 129* 138* 168 184 234    IMAGING STUDIES Dg Abd 1 View  11/11/2014   CLINICAL DATA:  Abdominal distention and discomfort, history of cirrhosis, acute renal failure, blood loss anemia, and C difficile colitis.  EXAM: ABDOMEN - 1 VIEW  COMPARISON:  None.  FINDINGS: There is moderate gaseous distention of small and large bowel loops. A small amount of gas is present in the rectum. There  is some stool in the right colon. No abnormal soft tissue calcifications are observed. The bony structures exhibit no acute abnormalities.  IMPRESSION: Bowel gas pattern most compatible with a generalized ileus. No definite obstructive pattern is observed. A three-way abdominal series or abdominal and pelvic CT scan would be useful for further evaluation.   Electronically Signed   By: David  Martinique M.D.   On: 11/11/2014 10:08   US Abdomen  Complete  11/04/2014   CLINICAL DATA:  Alcoholic cirrhosis, renal failure, hypertension  EXAM: ULTRASOUND ABDOMEN COMPLETE  COMPARISON:  CT abdomen and pelvis 08/09/2011  FINDINGS: Gallbladder: Distended gallbladder containing sludge. No definite shadowing calculi. No gallbladder wall thickening, pericholecystic fluid or sonographic Murphy sign.  Common bile duct: Diameter: 3 mm diameter, normal  Liver: Heterogeneous increased echogenicity with slightly nodular margins compatible with cirrhosis. Hepatopetal portal venous flow. No discrete hepatic mass identified, though assessment of intrahepatic detail is limited by sound attenuation.  IVC: Normal appearance  Pancreas: Obscured by bowel gas  Spleen: Appears mildly enlarged, 1 cm length, calculated volume 548 mL. No focal abnormalities  Right Kidney: Length: 12.5 cm. Normal morphology without mass or hydronephrosis. Small amount of perinephric fluid.  Left Kidney: Length: 12.1 cm. Normal morphology without mass or hydronephrosis.  Abdominal aorta: Predominately obscured by bowel gas.  Other findings: Small amount ascites.  Small LEFT pleural effusion.  IMPRESSION: Significant sludge within gallbladder without definite shadowing calculi.  Cirrhotic appearing liver with mild splenomegaly.  Incomplete visualization of pancreas and aorta.  Small amount of nonspecific RIGHT perinephric fluid.   Electronically Signed   By: Lavonia Dana M.D.   On: 11/04/2014 09:56   Ct Abdomen Pelvis W Contrast  11/11/2014   CLINICAL DATA:  54 year old male admitted with gastrointestinal bleeding and evidence of ileus on plain film.  EXAM: CT ABDOMEN AND PELVIS WITH CONTRAST  TECHNIQUE: Multidetector CT imaging of the abdomen and pelvis was performed using the standard protocol following bolus administration of intravenous contrast.  CONTRAST:  145mL OMNIPAQUE IOHEXOL 300 MG/ML  SOLN  COMPARISON:  CT 08/09/2011, plain film 11/11/2014  FINDINGS: Lower chest:  Unremarkable appearance of  the soft tissues of the chest wall.  Heart size within normal limits.  No pericardial fluid/thickening.  No lower mediastinal adenopathy.  Unremarkable appearance of the distal esophagus.  Small hiatal hernia.  No confluent airspace disease, pleural fluid, or pneumothorax within visualized lung.  Abdomen/pelvis:  Cirrhotic changes of the liver. Cranial caudal span of the liver measures 22 cm. Cranial caudal span of the spleen measures 16 cm.  Unremarkable bilateral adrenal glands.  Unremarkable pancreas.  Radiopaque gallstones within the gallbladder. No gallbladder wall thickening. There is adjacent fluid though this is likely related to ascites.  Free fluid throughout the abdomen, most pronounced within the bilateral pericolic gutter, perisplenic region,. Pack region, and dependently within the pelvis.  Haziness of the mesenteric fat.  Multiple lymph nodes in the hilum of the liver, including portacaval nodes and periportal nodes. Additional lymph nodes in the preaortic nodal station. These are all borderline enlarged.  No evidence of hydronephrosis or nephrolithiasis. Low-density cystic lesion on the lateral cortex of the left kidney, compatible with Bosniak 1 cyst.  Unremarkable course of the bilateral ureters. Unremarkable urinary bladder.  Enteric contrast reaches the transverse colon. Moderate to large stool burden.  No transition point or point of obstruction identified. No abnormally distended small bowel. Unremarkable thickness of the small bowel wall and the colonic wall. Appendix not visualized, though there does  not appear to be any specific focal inflammatory changes at the cecum.  No significant atherosclerotic changes though there are scattered calcifications.  Unremarkable appearance of the portal system with patency of the portal vein. Splenic vein remains patent. No significant esophageal varices or gastric varices identified.  Right-sided hydrocele.  No displaced fracture.  Mild degenerative  changes of the spine.  IMPRESSION: No evidence of bowel obstruction, with enteric contrast reaching the transverse colon. Moderate stool burden suggests constipation, or potentially colonic ileus.  Stigmata of cirrhosis and portal hypertension, with hepatosplenomegaly and ascites.  Cholelithiasis. If there is concern for acute biliary abnormality, recommend correlation with lab values and patient presentation.  Additional findings as above.  Signed,  Dulcy Fanny. Earleen Newport, DO  [facility]  [facility]   Electronically Signed   By: Corrie Mckusick D.O.   On: 11/11/2014 20:48   Dg Chest Port 1 View  11/06/2014   CLINICAL DATA:  Respiratory failure  EXAM: PORTABLE CHEST - 1 VIEW  COMPARISON:  11/05/2014  FINDINGS: Right IJ central line, tip at the distal SVC. Previously seen left IJ catheter has been removed.  Stable heart size and mediastinal contours.  Streaky opacity in the left lower lung. No edema, effusion, or pneumothorax.  IMPRESSION: 1. Focal opacity at the left base could reflect pneumonia or pneumonitis. 2. The remaining central line is in good position.   Electronically Signed   By: Monte Fantasia M.D.   On: 11/06/2014 05:24   Dg Chest Port 1 View  11/05/2014   CLINICAL DATA:  Central line placement  EXAM: PORTABLE CHEST - 1 VIEW  COMPARISON:  11/04/2014  FINDINGS: New left IJ central line is kinked at the tip, with tip directed to the left. Arterial and venous structures overlap in this region, but reportedly blood gas confirms venous placement. The tip could be directed into the distal left subclavian vein or a branch vessel (including IMV). No pneumothorax.  Increased opacification of the lower chest with lower lung volumes. No overt pulmonary edema or pleural effusion. Stable mild cardiomegaly.  These results were called by telephone at the time of interpretation on 11/05/2014 at 1:54 am to [place] via ICU RN.  IMPRESSION: 1. Abnormal positioning and kinking  of the new left IJ dialysis catheter, as above. 2. No pneumothorax. 3. Worsening aeration since yesterday.   Electronically Signed   By: Monte Fantasia M.D.   On: 11/05/2014 01:58   Dg Chest Port 1 View  11/04/2014   CLINICAL DATA:  GI bleed.  Line placement.  EXAM: PORTABLE CHEST - 1 VIEW  COMPARISON:  Chest x-ray from the same date at 00:09  FINDINGS: Right IJ central line, tip still at the lower SVC.  Normal heart size for technique. Negative aortic and hilar contours.  Stable lung inflation. No edema, effusion, consolidation, or air leak.  IMPRESSION: Cleared lung opacity compatible with resolved edema or atelectasis.   Electronically Signed   By: Monte Fantasia M.D.   On: 11/04/2014 05:43   Dg Chest Port 1 View  11/04/2014   CLINICAL DATA:  Central line placement.  Initial encounter.  EXAM: PORTABLE CHEST - 1 VIEW  COMPARISON:  None.  FINDINGS: A right IJ line is noted ending about the mid SVC.  The lungs are mildly hypoexpanded. Vascular congestion is noted, with mildly increased interstitial markings, concerning for mild interstitial edema. No pleural effusion or pneumothorax is seen.  The cardiomediastinal silhouette is borderline normal in size. No  acute osseous abnormalities are identified.  IMPRESSION: 1. Right IJ line noted ending about the mid SVC. 2. Lungs mildly hypoexpanded. Vascular congestion, with mildly increased interstitial markings, raising question for mild interstitial edema.   Electronically Signed   By: Garald Balding M.D.   On: 11/04/2014 00:33    DISCHARGE EXAMINATION: Filed Vitals:   11/17/14 1135 11/17/14 1252 11/17/14 2129 11/18/14 0640  BP: 119/78 122/68 112/67 118/66  Pulse: 93 91 97 97  Temp: 98 F (36.7 C) 98.1 F (36.7 C) 98.4 F (36.9 C) 98.4 F (36.9 C)  TempSrc: Oral Oral Oral Oral  Resp: 16 18 18 16   Height:      Weight:      SpO2: 99% 100% 100% 99%   General appearance: alert, cooperative, appears stated age and no distress Resp: clear to  auscultation bilaterally Cardio: regular rate and rhythm, S1, S2 normal, no murmur, click, rub or gallop GI: soft, non-tender; bowel sounds normal; no masses,  no organomegaly Extremities: extremities normal, atraumatic, no cyanosis or edema  DISPOSITION: Home with home health  Discharge Instructions    Call MD for:  extreme fatigue    Complete by:  As directed      Call MD for:  persistant dizziness or light-headedness    Complete by:  As directed      Call MD for:  persistant nausea and vomiting    Complete by:  As directed      Call MD for:  severe uncontrolled pain    Complete by:  As directed      Call MD for:  temperature >100.4    Complete by:  As directed      Diet - low sodium heart healthy    Complete by:  As directed      Discharge instructions    Complete by:  As directed   You will need to follow-up with [name] regarding the episodes of rectal bleeding. Take medications as prescribed. Seek attention if you have recurrence of rectal bleeding. The hemorrhoidal component of bleeding should subside in a few more days.  You were cared for by a hospitalist during your hospital stay. If you have any questions about your discharge medications or the care you received while you were in the hospital after you are discharged, you can call the unit and asked to speak with the hospitalist on call if the hospitalist that took care of you is not available. Once you are discharged, your primary care physician will handle any further medical issues. Please note that NO REFILLS for any discharge medications will be authorized once you are discharged, as it is imperative that you return to your primary care physician (or establish a relationship with a primary care physician if you do not have one) for your aftercare needs so that they can reassess your need for medications and monitor your lab values. If you do not have a primary care physician, you can call [phone] for a physician referral.      Increase activity slowly    Complete by:  As directed            ALLERGIES:  Allergies  Allergen Reactions  . Metoclopramide Hcl     REACTION: unspecified     Discharge Medication List as of 11/18/2014 11:49 AM    START taking these medications   Details  furosemide (LASIX) 40 MG tablet Take 1 tablet (40 mg total) by mouth daily., Starting 11/14/2014, Until Discontinued, Print  hydrocortisone (ANUSOL-HC) 2.5 % rectal cream Place rectally 3 (three) times daily., Starting 11/14/2014, Until Discontinued, Print    levothyroxine (SYNTHROID, LEVOTHROID) 50 MCG tablet Take 1 tablet (50 mcg total) by mouth daily before breakfast., Starting 11/14/2014, Until Discontinued, Print    magnesium oxide (MAG-OX) 400 (241.3 MG) MG tablet Take 1 tablet (400 mg total) by mouth daily., Starting 11/14/2014, Until Discontinued, Print    metroNIDAZOLE (FLAGYL) 500 MG tablet Take 1 tablet (500 mg total) by mouth every 8 (eight) hours. For 7 more days, Starting 11/14/2014, Until Discontinued, Print    oxyCODONE (OXY IR/ROXICODONE) 5 MG immediate release tablet Take 1 tablet (5 mg total) by mouth every 6 (six) hours as needed for severe pain., Starting 11/14/2014, Until Discontinued, Print    saccharomyces boulardii (FLORASTOR) 250 MG capsule Take 1 capsule (250 mg total) by mouth 2 (two) times daily., Starting 11/14/2014, Until Discontinued, Print    spironolactone (ALDACTONE) 100 MG tablet Take 1 tablet (100 mg total) by mouth daily., Starting 11/14/2014, Until Discontinued, Print    vancomycin (VANCOCIN) 250 MG capsule Take 2 capsules (500 mg total) by mouth 4 (four) times daily. For 7 more days., Starting 11/14/2014, Until Discontinued, Print      CONTINUE these medications which have NOT CHANGED   Details  Multiple Vitamins-Minerals (CENTRUM PO) Take 1 tablet by mouth daily., Until Discontinued, Historical Med    nadolol (CORGARD) 40 MG tablet TAKE 1 TABLET ONCE DAILY., Normal    pantoprazole  (PROTONIX) 40 MG tablet TAKE 1 TABLET DAILY., Normal    venlafaxine XR (EFFEXOR-XR) 150 MG 24 hr capsule TAKE (1) CAPSULE TWICE DAILY., Normal      STOP taking these medications     losartan (COZAAR) 50 MG tablet      cephALEXin (KEFLEX) 500 MG capsule      HYDROcodone-ibuprofen (VICOPROFEN) 7.5-200 MG per tablet        Follow-up Information    Follow up with Dorothyann Peng, NP. Schedule an appointment as soon as possible for a visit on 11/19/2014.   Specialty:  Family Medicine   Why:  post hospitalization follow up Appt at 9:30am   Contact information:   [address] [place] [address] [phone]       Follow up with Erskine Emery, MD On 01/21/2015.   Specialty:  Gastroenterology   Why:  for rectal bleeding    Appt at 1:45   Contact information:   [address] [place] [address] [phone]       Follow up with [facility].   Why:  Home Health RN   Contact information:   [address] [place] [address] [phone]       TOTAL DISCHARGE TIME: 69 minutes  [facility] Pager [phone]  11/18/2014, 3:20 PM

## 2014-11-18 NOTE — Progress Notes (Signed)
Patient discharged to home with [facility]. Discharge instructions were reviewed with patient and a copy was provided to patient as well. Patient agreed and verbalized understanding. No further questions at this moment.  Vergia Alberts, RN

## 2014-11-18 NOTE — Discharge Instructions (Signed)
Alcoholic Liver Disease Alcoholic liver disease means you have a damaged liver that does not work properly. This disease is caused by drinking too much alcohol. Some people do not have any problems until the disease has become very bad. Problems can be worse after a period of heavy drinking. HOME CARE  Stop drinking alcohol.  Get expert advice or help (counseling) to quit drinking. Join an alchohol support group.  You may need to eat foods that give you energy (carbohydrates), such as:  Milk and yogurt.  Navy, pinto, and white beans.  Applesauce, grapes, dried dates, prunes, and raisins.  Potatoes and rice.  Eat foods that are high in vitamins, especially thiamine and folic acid. These foods include:  Whole-wheat or whole-grain breads and cereals. Look on the package for "added thiamine" or "added folic acid."  Meat, especially pork.  Fresh, raw vegetables.  Fresh fruits and vegetables, such as oranges, orange juice, avocados, beets, and cantaloupe.  Dark green, leafy vegetables, such as romaine lettuce, spinach, and broccoli.  Beans and nuts.  Dairy foods, such as milk, butter, yogurt, cheese, and ice cream. GET HELP RIGHT AWAY IF:   You have bright red blood in your poop (stool).  You cough or throw up (vomit) blood.  Your skin and eyes turn more yellow.  You have a bad headache or problems thinking.  You have trouble walking. MAKE SURE YOU:   Understand these instructions.  Will watch your condition.  Will get help right away if you are not doing well or get worse. Document Released: 02/14/2009 Document Revised: 07/12/2011 Document Reviewed: 02/14/2009 [facility] Patient Information 2015 [place], [place]. This information is not intended to replace advice given to you by your health care provider. Make sure you discuss any questions you have with your health care provider.   Hemorrhoids Hemorrhoids are swollen veins around the rectum or anus. There are two  types of hemorrhoids:   Internal hemorrhoids. These occur in the veins just inside the rectum. They may poke through to the outside and become irritated and painful.  External hemorrhoids. These occur in the veins outside the anus and can be felt as a painful swelling or hard lump near the anus. CAUSES  Pregnancy.   Obesity.   Constipation or diarrhea.   Straining to have a bowel movement.   Sitting for long periods on the toilet.  Heavy lifting or other activity that caused you to strain.  Anal intercourse. SYMPTOMS   Pain.   Anal itching or irritation.   Rectal bleeding.   Fecal leakage.   Anal swelling.   One or more lumps around the anus.  DIAGNOSIS  Your caregiver may be able to diagnose hemorrhoids by visual examination. Other examinations or tests that may be performed include:   Examination of the rectal area with a gloved hand (digital rectal exam).   Examination of anal canal using a small tube (scope).   A blood test if you have lost a significant amount of blood.  A test to look inside the colon (sigmoidoscopy or colonoscopy). TREATMENT Most hemorrhoids can be treated at home. However, if symptoms do not seem to be getting better or if you have a lot of rectal bleeding, your caregiver may perform a procedure to help make the hemorrhoids get smaller or remove them completely. Possible treatments include:   Placing a rubber band at the base of the hemorrhoid to cut off the circulation (rubber band ligation).   Injecting a chemical to shrink the hemorrhoid (  sclerotherapy).   Using a tool to burn the hemorrhoid (infrared light therapy).   Surgically removing the hemorrhoid (hemorrhoidectomy).   Stapling the hemorrhoid to block blood flow to the tissue (hemorrhoid stapling).  HOME CARE INSTRUCTIONS   Eat foods with fiber, such as whole grains, beans, nuts, fruits, and vegetables. Ask your doctor about taking products with added fiber in  them (fibersupplements).  Increase fluid intake. Drink enough water and fluids to keep your urine clear or pale yellow.   Exercise regularly.   Go to the bathroom when you have the urge to have a bowel movement. Do not wait.   Avoid straining to have bowel movements.   Keep the anal area dry and clean. Use wet toilet paper or moist towelettes after a bowel movement.   Medicated creams and suppositories may be used or applied as directed.   Only take over-the-counter or prescription medicines as directed by your caregiver.   Take warm sitz baths for 15-20 minutes, 3-4 times a day to ease pain and discomfort.   Place ice packs on the hemorrhoids if they are tender and swollen. Using ice packs between sitz baths may be helpful.   Put ice in a plastic bag.   Place a towel between your skin and the bag.   Leave the ice on for 15-20 minutes, 3-4 times a day.   Do not use a donut-shaped pillow or sit on the toilet for long periods. This increases blood pooling and pain.  SEEK MEDICAL CARE IF:  You have increasing pain and swelling that is not controlled by treatment or medicine.  You have uncontrolled bleeding.  You have difficulty or you are unable to have a bowel movement.  You have pain or inflammation outside the area of the hemorrhoids. MAKE SURE YOU:  Understand these instructions.  Will watch your condition.  Will get help right away if you are not doing well or get worse. Document Released: 04/16/2000 Document Revised: 04/05/2012 Document Reviewed: 02/22/2012 [facility] Patient Information 2015 [place], [place]. This information is not intended to replace advice given to you by your health care provider. Make sure you discuss any questions you have with your health care provider.

## 2014-11-19 ENCOUNTER — Encounter (HOSPITAL_COMMUNITY): Payer: Self-pay | Admitting: Physical Medicine and Rehabilitation

## 2014-11-19 ENCOUNTER — Ambulatory Visit: Payer: Self-pay | Admitting: Adult Health

## 2014-11-19 ENCOUNTER — Emergency Department (HOSPITAL_COMMUNITY)
Admission: EM | Admit: 2014-11-19 | Discharge: 2014-11-20 | Disposition: A | Discharge status: Home / Self Care | Payer: Self-pay | Attending: Emergency Medicine | Admitting: Emergency Medicine

## 2014-11-19 DIAGNOSIS — F419 Anxiety disorder, unspecified: Secondary | ICD-10-CM | POA: Insufficient documentation

## 2014-11-19 DIAGNOSIS — Z79899 Other long term (current) drug therapy: Secondary | ICD-10-CM | POA: Insufficient documentation

## 2014-11-19 DIAGNOSIS — I10 Essential (primary) hypertension: Secondary | ICD-10-CM | POA: Insufficient documentation

## 2014-11-19 DIAGNOSIS — K219 Gastro-esophageal reflux disease without esophagitis: Secondary | ICD-10-CM | POA: Insufficient documentation

## 2014-11-19 DIAGNOSIS — Z720 Tobacco use: Secondary | ICD-10-CM | POA: Insufficient documentation

## 2014-11-19 DIAGNOSIS — Z8669 Personal history of other diseases of the nervous system and sense organs: Secondary | ICD-10-CM | POA: Insufficient documentation

## 2014-11-19 DIAGNOSIS — Z792 Long term (current) use of antibiotics: Secondary | ICD-10-CM | POA: Insufficient documentation

## 2014-11-19 DIAGNOSIS — F329 Major depressive disorder, single episode, unspecified: Secondary | ICD-10-CM | POA: Insufficient documentation

## 2014-11-19 DIAGNOSIS — R531 Weakness: Secondary | ICD-10-CM | POA: Insufficient documentation

## 2014-11-19 DIAGNOSIS — Z8619 Personal history of other infectious and parasitic diseases: Secondary | ICD-10-CM | POA: Insufficient documentation

## 2014-11-19 DIAGNOSIS — K921 Melena: Secondary | ICD-10-CM | POA: Insufficient documentation

## 2014-11-19 DIAGNOSIS — R197 Diarrhea, unspecified: Secondary | ICD-10-CM | POA: Insufficient documentation

## 2014-11-19 LAB — COMPREHENSIVE METABOLIC PANEL
ALT: 29 U/L (ref 17–63)
AST: 86 U/L — AB (ref 15–41)
Albumin: 2.4 g/dL — ABNORMAL LOW (ref 3.5–5.0)
Alkaline Phosphatase: 267 U/L — ABNORMAL HIGH (ref 38–126)
Anion gap: 10 (ref 5–15)
BUN: 11 mg/dL (ref 6–20)
CHLORIDE: 104 mmol/L (ref 101–111)
CO2: 18 mmol/L — AB (ref 22–32)
Calcium: 8.7 mg/dL — ABNORMAL LOW (ref 8.9–10.3)
Creatinine, Ser: 0.99 mg/dL (ref 0.61–1.24)
GFR calc Af Amer: >60 mL/min (ref >60)
GFR calc non Af Amer: >60 mL/min (ref >60)
Glucose, Bld: 104 mg/dL — ABNORMAL HIGH (ref 65–99)
POTASSIUM: 3.4 mmol/L — AB (ref 3.5–5.1)
SODIUM: 132 mmol/L — AB (ref 135–145)
Total Bilirubin: 3.3 mg/dL — ABNORMAL HIGH (ref 0.3–1.2)
Total Protein: 7.4 g/dL (ref 6.5–8.1)

## 2014-11-19 LAB — CBC WITH DIFFERENTIAL/PLATELET
Basophils Absolute: 0.1 10*3/uL (ref 0.0–0.1)
Basophils Relative: 1 % (ref 0–1)
EOS PCT: 4 % (ref 0–5)
Eosinophils Absolute: 0.2 10*3/uL (ref 0.0–0.7)
HCT: 29 % — ABNORMAL LOW (ref 39.0–52.0)
Hemoglobin: 9.7 g/dL — ABNORMAL LOW (ref 13.0–17.0)
Lymphocytes Relative: 24 % (ref 12–46)
Lymphs Abs: 1.6 10*3/uL (ref 0.7–4.0)
MCH: 32.9 pg (ref 26.0–34.0)
MCHC: 33.4 g/dL (ref 30.0–36.0)
MCV: 98.3 fL (ref 78.0–100.0)
MONO ABS: 0.6 10*3/uL (ref 0.1–1.0)
Monocytes Relative: 9 % (ref 3–12)
NEUTROS ABS: 4.1 10*3/uL (ref 1.7–7.7)
Neutrophils Relative %: 62 % (ref 43–77)
PLATELETS: 297 10*3/uL (ref 150–400)
RBC: 2.95 MIL/uL — ABNORMAL LOW (ref 4.22–5.81)
RDW: 18.3 % — ABNORMAL HIGH (ref 11.5–15.5)
WBC: 6.5 10*3/uL (ref 4.0–10.5)

## 2014-11-19 MED ORDER — METRONIDAZOLE 500 MG PO TABS
500.0000 mg | ORAL_TABLET | Freq: Three times a day (TID) | ORAL | Status: DC
  Administered 2014-11-19 – 2014-11-20 (×3): 500 mg via ORAL
  Filled 2014-11-19 (×3): qty 1

## 2014-11-19 MED ORDER — POTASSIUM CHLORIDE CRYS ER 20 MEQ PO TBCR
40.0000 meq | EXTENDED_RELEASE_TABLET | Freq: Once | ORAL | Status: AC
  Administered 2014-11-19: 40 meq via ORAL
  Filled 2014-11-19: qty 2

## 2014-11-19 MED ORDER — SODIUM CHLORIDE 0.9 % IV BOLUS (SEPSIS)
2000.0000 mL | Freq: Once | INTRAVENOUS | Status: AC
  Administered 2014-11-19: 2000 mL via INTRAVENOUS

## 2014-11-19 MED ORDER — VANCOMYCIN 50 MG/ML ORAL SOLUTION
125.0000 mg | Freq: Four times a day (QID) | ORAL | Status: DC
  Administered 2014-11-19 – 2014-11-20 (×3): 125 mg via ORAL
  Filled 2014-11-19 (×7): qty 2.5

## 2014-11-19 MED ORDER — OXYCODONE HCL 5 MG PO TABS
5.0000 mg | ORAL_TABLET | Freq: Four times a day (QID) | ORAL | Status: DC | PRN
  Administered 2014-11-19 – 2014-11-20 (×3): 5 mg via ORAL
  Filled 2014-11-19 (×3): qty 1

## 2014-11-19 NOTE — ED Provider Notes (Addendum)
CSN: [id number]     Arrival date & time 11/19/14  1310 History   First MD Initiated Contact with Patient 11/19/14 1409     Chief Complaint  Patient presents with  . Diarrhea     (Consider location/radiation/quality/duration/timing/severity/associated sxs/prior Treatment) HPI  Patient returns after discharge from inpatient stay yesterday he spent approximately 15 days in the hospital for diarrhea determined to have C. Difficil colitis. He was offered transfer to skilled nursing facility which he declined. Upon returning home yesterday he had multiple episodes diarrhea in his bed and feels generalized weakness worse with standing. He denies pain anywhere. Admits to trace amounts of blood per rectum. Presently hungry. No other associated symptoms. Weakness is worse with standing improve with remaining still. Treated with oral antibiotics Past Medical History  Diagnosis Date  . ANXIETY 04/25/2008    Qualifier: Diagnosis of  By: Sherren Mocha MD, Jory Ee   . DEPRESSION 04/25/2008    Qualifier: Diagnosis of  By: Sherren Mocha MD, Jory Ee   . HYPERTENSION 04/25/2008    Qualifier: Diagnosis of  By: Sherren Mocha MD, Jory Ee   . GERD 04/25/2008    Qualifier: Diagnosis of  By: Sherren Mocha MD, Jory Ee   . DIVERTICULOSIS, COLON 06/13/2009    Qualifier: Diagnosis of  By: Sherren Mocha MD, [place], PERIRECTAL 08/05/2008    Qualifier: Diagnosis of  By: Sherlynn Stalls, CMA, [place]    . ABDOMINAL PAIN, ACUTE 05/23/2009    Qualifier: Diagnosis of  By: Sherren Mocha MD, Jory Ee   . Alcoholism   . Essential tremor    Past Surgical History  Procedure Laterality Date  . Perianal abcess    . Tonsillectomy and adenoidectomy    . Esophagogastroduodenoscopy  08/27/2011    Procedure: ESOPHAGOGASTRODUODENOSCOPY (EGD);  Surgeon: Inda Castle, MD;  Location: [facility];  Service: Endoscopy;  Laterality: N/A;  See lab draw orders for pre procedure  . Colonoscopy N/A 11/17/2014    Procedure: COLONOSCOPY;  Surgeon: Juanita Craver, MD;  Location: [facility]  ENDOSCOPY;  Service: Endoscopy;  Laterality: N/A;   Family History  Problem Relation Age of Onset  . Stomach cancer Maternal Grandfather   . Hypertension Maternal Uncle     great uncle   History  Substance Use Topics  . Smoking status: Current Every Day Smoker -- 0.50 packs/day    Types: Cigarettes  . Smokeless tobacco: Never Used  . Alcohol Use: Yes     Comment: 4 per day    Review of Systems  HENT: Negative.   Respiratory: Negative.   Cardiovascular: Negative.   Gastrointestinal: Positive for diarrhea and blood in stool.  Musculoskeletal: Negative.   Skin: Negative.   Neurological: Positive for weakness.  Psychiatric/Behavioral: Negative.   All other systems reviewed and are negative.     Allergies  Metoclopramide hcl  Home Medications   Prior to Admission medications   Medication Sig Start Date End Date Taking? Authorizing Provider  furosemide (LASIX) 40 MG tablet Take 1 tablet (40 mg total) by mouth daily. 11/14/14   Bonnielee Haff, MD  hydrocortisone (ANUSOL-HC) 2.5 % rectal cream Place rectally 3 (three) times daily. 11/14/14   Bonnielee Haff, MD  levothyroxine (SYNTHROID, LEVOTHROID) 50 MCG tablet Take 1 tablet (50 mcg total) by mouth daily before breakfast. 11/14/14   Bonnielee Haff, MD  magnesium oxide (MAG-OX) 400 (241.3 MG) MG tablet Take 1 tablet (400 mg total) by mouth daily. 11/14/14   Bonnielee Haff, MD  metroNIDAZOLE (FLAGYL) 500 MG tablet Take 1 tablet (  500 mg total) by mouth every 8 (eight) hours. For 7 more days 11/14/14   Bonnielee Haff, MD  Multiple Vitamins-Minerals (CENTRUM PO) Take 1 tablet by mouth daily.    Historical Provider, MD  nadolol (CORGARD) 40 MG tablet TAKE 1 TABLET ONCE DAILY. 07/04/13   Inda Castle, MD  oxyCODONE (OXY IR/ROXICODONE) 5 MG immediate release tablet Take 1 tablet (5 mg total) by mouth every 6 (six) hours as needed for severe pain. 11/14/14   Bonnielee Haff, MD  pantoprazole (PROTONIX) 40 MG tablet TAKE 1 TABLET DAILY.  03/07/13   Dorena Cookey, MD  saccharomyces boulardii (FLORASTOR) 250 MG capsule Take 1 capsule (250 mg total) by mouth 2 (two) times daily. 11/14/14   Bonnielee Haff, MD  spironolactone (ALDACTONE) 100 MG tablet Take 1 tablet (100 mg total) by mouth daily. 11/14/14   Bonnielee Haff, MD  vancomycin (VANCOCIN) 250 MG capsule Take 2 capsules (500 mg total) by mouth 4 (four) times daily. For 7 more days. 11/14/14   Bonnielee Haff, MD  venlafaxine XR (EFFEXOR-XR) 150 MG 24 hr capsule TAKE (1) CAPSULE TWICE DAILY. 03/07/13   Dorena Cookey, MD   BP 125/75 mmHg  Pulse 110  Temp(Src) 98.8 F (37.1 C) (Oral)  Resp 18  SpO2 100% Physical Exam  Constitutional: He appears well-developed and well-nourished.  HENT:  Head: Normocephalic and atraumatic.  Eyes: Conjunctivae are normal. Pupils are equal, round, and reactive to light.  Neck: Neck supple. No tracheal deviation present. No thyromegaly present.  Cardiovascular: Normal rate and regular rhythm.   No murmur heard. Pulmonary/Chest: Effort normal and breath sounds normal.  Abdominal: Soft. Bowel sounds are normal. He exhibits no distension. There is no tenderness.  Musculoskeletal: Normal range of motion. He exhibits no edema or tenderness.  Neurological: He is alert. Coordination normal.  Skin: Skin is warm and dry. No rash noted.  Psychiatric: He has a normal mood and affect.  Nursing note and vitals reviewed.   ED Course  Procedures (including critical care time) Labs Review Labs Reviewed  CBC WITH DIFFERENTIAL/PLATELET  COMPREHENSIVE METABOLIC PANEL    Imaging Review No results found.   EKG Interpretation None     5 PM patient is alert and ambulates unassisted. He feels improved after treatment with intravenous saline and he ate a meal while here. Results for orders placed or performed during the hospital encounter of 11/19/14  CBC with Differential  Result Value Ref Range   WBC 6.5 4.0 - 10.5 K/uL   RBC 2.95 (L) 4.22 - 5.81  MIL/uL   Hemoglobin 9.7 (L) 13.0 - 17.0 g/dL   HCT 29.0 (L) 39.0 - 52.0 %   MCV 98.3 78.0 - 100.0 fL   MCH 32.9 26.0 - 34.0 pg   MCHC 33.4 30.0 - 36.0 g/dL   RDW 18.3 (H) 11.5 - 15.5 %   Platelets 297 150 - 400 K/uL   Neutrophils Relative % 62 43 - 77 %   Neutro Abs 4.1 1.7 - 7.7 K/uL   Lymphocytes Relative 24 12 - 46 %   Lymphs Abs 1.6 0.7 - 4.0 K/uL   Monocytes Relative 9 3 - 12 %   Monocytes Absolute 0.6 0.1 - 1.0 K/uL   Eosinophils Relative 4 0 - 5 %   Eosinophils Absolute 0.2 0.0 - 0.7 K/uL   Basophils Relative 1 0 - 1 %   Basophils Absolute 0.1 0.0 - 0.1 K/uL  Comprehensive metabolic panel  Result Value Ref Range  Sodium 132 (L) 135 - 145 mmol/L   Potassium 3.4 (L) 3.5 - 5.1 mmol/L   Chloride 104 101 - 111 mmol/L   CO2 18 (L) 22 - 32 mmol/L   Glucose, Bld 104 (H) 65 - 99 mg/dL   BUN 11 6 - 20 mg/dL   Creatinine, Ser 0.99 0.61 - 1.24 mg/dL   Calcium 8.7 (L) 8.9 - 10.3 mg/dL   Total Protein 7.4 6.5 - 8.1 g/dL   Albumin 2.4 (L) 3.5 - 5.0 g/dL   AST 86 (H) 15 - 41 U/L   ALT 29 17 - 63 U/L   Alkaline Phosphatase 267 (H) 38 - 126 U/L   Total Bilirubin 3.3 (H) 0.3 - 1.2 mg/dL   GFR calc non Af Amer >60 >60 mL/min   GFR calc Af Amer >60 >60 mL/min   Anion gap 10 5 - 15   Dg Abd 1 View  11/11/2014   CLINICAL DATA:  Abdominal distention and discomfort, history of cirrhosis, acute renal failure, blood loss anemia, and C difficile colitis.  EXAM: ABDOMEN - 1 VIEW  COMPARISON:  None.  FINDINGS: There is moderate gaseous distention of small and large bowel loops. A small amount of gas is present in the rectum. There is some stool in the right colon. No abnormal soft tissue calcifications are observed. The bony structures exhibit no acute abnormalities.  IMPRESSION: Bowel gas pattern most compatible with a generalized ileus. No definite obstructive pattern is observed. A three-way abdominal series or abdominal and pelvic CT scan would be useful for further evaluation.   Electronically  Signed   By: David  Martinique M.D.   On: 11/11/2014 10:08   US Abdomen Complete  11/04/2014   CLINICAL DATA:  Alcoholic cirrhosis, renal failure, hypertension  EXAM: ULTRASOUND ABDOMEN COMPLETE  COMPARISON:  CT abdomen and pelvis 08/09/2011  FINDINGS: Gallbladder: Distended gallbladder containing sludge. No definite shadowing calculi. No gallbladder wall thickening, pericholecystic fluid or sonographic Murphy sign.  Common bile duct: Diameter: 3 mm diameter, normal  Liver: Heterogeneous increased echogenicity with slightly nodular margins compatible with cirrhosis. Hepatopetal portal venous flow. No discrete hepatic mass identified, though assessment of intrahepatic detail is limited by sound attenuation.  IVC: Normal appearance  Pancreas: Obscured by bowel gas  Spleen: Appears mildly enlarged, 1 cm length, calculated volume 548 mL. No focal abnormalities  Right Kidney: Length: 12.5 cm. Normal morphology without mass or hydronephrosis. Small amount of perinephric fluid.  Left Kidney: Length: 12.1 cm. Normal morphology without mass or hydronephrosis.  Abdominal aorta: Predominately obscured by bowel gas.  Other findings: Small amount ascites.  Small LEFT pleural effusion.  IMPRESSION: Significant sludge within gallbladder without definite shadowing calculi.  Cirrhotic appearing liver with mild splenomegaly.  Incomplete visualization of pancreas and aorta.  Small amount of nonspecific RIGHT perinephric fluid.   Electronically Signed   By: Lavonia Dana M.D.   On: 11/04/2014 09:56   Ct Abdomen Pelvis W Contrast  11/11/2014   CLINICAL DATA:  54 year old male admitted with gastrointestinal bleeding and evidence of ileus on plain film.  EXAM: CT ABDOMEN AND PELVIS WITH CONTRAST  TECHNIQUE: Multidetector CT imaging of the abdomen and pelvis was performed using the standard protocol following bolus administration of intravenous contrast.  CONTRAST:  163mL OMNIPAQUE IOHEXOL 300 MG/ML  SOLN  COMPARISON:  CT 08/09/2011, plain  film 11/11/2014  FINDINGS: Lower chest:  Unremarkable appearance of the soft tissues of the chest wall.  Heart size within normal limits.  No pericardial fluid/thickening.  No lower mediastinal adenopathy.  Unremarkable appearance of the distal esophagus.  Small hiatal hernia.  No confluent airspace disease, pleural fluid, or pneumothorax within visualized lung.  Abdomen/pelvis:  Cirrhotic changes of the liver. Cranial caudal span of the liver measures 22 cm. Cranial caudal span of the spleen measures 16 cm.  Unremarkable bilateral adrenal glands.  Unremarkable pancreas.  Radiopaque gallstones within the gallbladder. No gallbladder wall thickening. There is adjacent fluid though this is likely related to ascites.  Free fluid throughout the abdomen, most pronounced within the bilateral pericolic gutter, perisplenic region,. Pack region, and dependently within the pelvis.  Haziness of the mesenteric fat.  Multiple lymph nodes in the hilum of the liver, including portacaval nodes and periportal nodes. Additional lymph nodes in the preaortic nodal station. These are all borderline enlarged.  No evidence of hydronephrosis or nephrolithiasis. Low-density cystic lesion on the lateral cortex of the left kidney, compatible with Bosniak 1 cyst.  Unremarkable course of the bilateral ureters. Unremarkable urinary bladder.  Enteric contrast reaches the transverse colon. Moderate to large stool burden.  No transition point or point of obstruction identified. No abnormally distended small bowel. Unremarkable thickness of the small bowel wall and the colonic wall. Appendix not visualized, though there does not appear to be any specific focal inflammatory changes at the cecum.  No significant atherosclerotic changes though there are scattered calcifications.  Unremarkable appearance of the portal system with patency of the portal vein. Splenic vein remains patent. No significant esophageal varices or gastric varices identified.   Right-sided hydrocele.  No displaced fracture.  Mild degenerative changes of the spine.  IMPRESSION: No evidence of bowel obstruction, with enteric contrast reaching the transverse colon. Moderate stool burden suggests constipation, or potentially colonic ileus.  Stigmata of cirrhosis and portal hypertension, with hepatosplenomegaly and ascites.  Cholelithiasis. If there is concern for acute biliary abnormality, recommend correlation with lab values and patient presentation.  Additional findings as above.  Signed,  Dulcy Fanny. Earleen Newport, DO  [facility]  [facility]   Electronically Signed   By: Corrie Mckusick D.O.   On: 11/11/2014 20:48   Dg Chest Port 1 View  11/06/2014   CLINICAL DATA:  Respiratory failure  EXAM: PORTABLE CHEST - 1 VIEW  COMPARISON:  11/05/2014  FINDINGS: Right IJ central line, tip at the distal SVC. Previously seen left IJ catheter has been removed.  Stable heart size and mediastinal contours.  Streaky opacity in the left lower lung. No edema, effusion, or pneumothorax.  IMPRESSION: 1. Focal opacity at the left base could reflect pneumonia or pneumonitis. 2. The remaining central line is in good position.   Electronically Signed   By: Monte Fantasia M.D.   On: 11/06/2014 05:24   Dg Chest Port 1 View  11/05/2014   CLINICAL DATA:  Central line placement  EXAM: PORTABLE CHEST - 1 VIEW  COMPARISON:  11/04/2014  FINDINGS: New left IJ central line is kinked at the tip, with tip directed to the left. Arterial and venous structures overlap in this region, but reportedly blood gas confirms venous placement. The tip could be directed into the distal left subclavian vein or a branch vessel (including IMV). No pneumothorax.  Increased opacification of the lower chest with lower lung volumes. No overt pulmonary edema or pleural effusion. Stable mild cardiomegaly.  These results were called by telephone at the time of interpretation on 11/05/2014 at 1:54 am to [place] via ICU RN.  IMPRESSION: 1. Abnormal  positioning and kinking of the new left IJ dialysis catheter, as above. 2. No pneumothorax. 3. Worsening aeration since yesterday.   Electronically Signed   By: Monte Fantasia M.D.   On: 11/05/2014 01:58   Dg Chest Port 1 View  11/04/2014   CLINICAL DATA:  GI bleed.  Line placement.  EXAM: PORTABLE CHEST - 1 VIEW  COMPARISON:  Chest x-ray from the same date at 00:09  FINDINGS: Right IJ central line, tip still at the lower SVC.  Normal heart size for technique. Negative aortic and hilar contours.  Stable lung inflation. No edema, effusion, consolidation, or air leak.  IMPRESSION: Cleared lung opacity compatible with resolved edema or atelectasis.   Electronically Signed   By: Monte Fantasia M.D.   On: 11/04/2014 05:43   Dg Chest Port 1 View  11/04/2014   CLINICAL DATA:  Central line placement.  Initial encounter.  EXAM: PORTABLE CHEST - 1 VIEW  COMPARISON:  None.  FINDINGS: A right IJ line is noted ending about the mid SVC.  The lungs are mildly hypoexpanded. Vascular congestion is noted, with mildly increased interstitial markings, concerning for mild interstitial edema. No pleural effusion or pneumothorax is seen.  The cardiomediastinal silhouette is borderline normal in size. No acute osseous abnormalities are identified.  IMPRESSION: 1. Right IJ line noted ending about the mid SVC. 2. Lungs mildly hypoexpanded. Vascular congestion, with mildly increased interstitial markings, raising question for mild interstitial edema.   Electronically Signed   By: Garald Balding M.D.   On: 11/04/2014 00:33    MDM  Case management consult for skilled nursing facility placement Final diagnoses:  None   case management arranged for bedside commode and home health care. Also arranged to get patient's prescriptions filled before discharge today. Patient is to follow-up with [facility] next week Diagnosis #1 diarrhea #2 mild dehydration #3  hypokalemia      Orlie Dakin, MD 11/19/14 [place], MD 11/19/14 1719

## 2014-11-19 NOTE — Discharge Instructions (Signed)
Dehydration, Adult Make sure that you take your medications as prescribed. Avoid milk or foods contain milk such as ice cream or cheese while having diarrhea. Keep your scheduled appointment with the [facility]. Dehydration is when you lose more fluids from the body than you take in. Vital organs like the kidneys, brain, and heart cannot function without a proper amount of fluids and salt. Any loss of fluids from the body can cause dehydration.  CAUSES   Vomiting.  Diarrhea.  Excessive sweating.  Excessive urine output.  Fever. SYMPTOMS  Mild dehydration  Thirst.  Dry lips.  Slightly dry mouth. Moderate dehydration  Very dry mouth.  Sunken eyes.  Skin does not bounce back quickly when lightly pinched and released.  Dark urine and decreased urine production.  Decreased tear production.  Headache. Severe dehydration  Very dry mouth.  Extreme thirst.  Rapid, weak pulse (more than 100 beats per minute at rest).  Cold hands and feet.  Not able to sweat in spite of heat and temperature.  Rapid breathing.  Blue lips.  Confusion and lethargy.  Difficulty being awakened.  Minimal urine production.  No tears. DIAGNOSIS  Your caregiver will diagnose dehydration based on your symptoms and your exam. Blood and urine tests will help confirm the diagnosis. The diagnostic evaluation should also identify the cause of dehydration. TREATMENT  Treatment of mild or moderate dehydration can often be done at home by increasing the amount of fluids that you drink. It is best to drink small amounts of fluid more often. Drinking too much at one time can make vomiting worse. Refer to the home care instructions below. Severe dehydration needs to be treated at the hospital where you will probably be given intravenous (IV) fluids that contain water and electrolytes. HOME CARE INSTRUCTIONS   Ask your caregiver about specific rehydration  instructions.  Drink enough fluids to keep your urine clear or pale yellow.  Drink small amounts frequently if you have nausea and vomiting.  Eat as you normally do.  Avoid:  Foods or drinks high in sugar.  Carbonated drinks.  Juice.  Extremely hot or cold fluids.  Drinks with caffeine.  Fatty, greasy foods.  Alcohol.  Tobacco.  Overeating.  Gelatin desserts.  Wash your hands well to avoid spreading bacteria and viruses.  Only take over-the-counter or prescription medicines for pain, discomfort, or fever as directed by your caregiver.  Ask your caregiver if you should continue all prescribed and over-the-counter medicines.  Keep all follow-up appointments with your caregiver. SEEK MEDICAL CARE IF:  You have abdominal pain and it increases or stays in one area (localizes).  You have a rash, stiff neck, or severe headache.  You are irritable, sleepy, or difficult to awaken.  You are weak, dizzy, or extremely thirsty. SEEK IMMEDIATE MEDICAL CARE IF:   You are unable to keep fluids down or you get worse despite treatment.  You have frequent episodes of vomiting or diarrhea.  You have blood or green matter (bile) in your vomit.  You have blood in your stool or your stool looks black and tarry.  You have not urinated in 6 to 8 hours, or you have only urinated a small amount of very dark urine.  You have a fever.  You faint. MAKE SURE YOU:   Understand these instructions.  Will watch your condition.  Will get help right away if you are not doing well or get worse. Document Released: 04/19/2005 Document Revised: 07/12/2011 Document Reviewed:  12/07/2010 ExitCare Patient Information 2015 [place], [place]. This information is not intended to replace advice given to you by your health care provider. Make sure you discuss any questions you have with your health care provider.

## 2014-11-19 NOTE — Care Management (Addendum)
ED CM consulted by Dr. Winfred Leeds concerning patient, returning after being  discharged from [facility] yesterday, Patient presented to [facility] ED with diarrhea.  Reviewed patient's record, patient was evaluated and SNF was recommended patient declined and was discharged with [facility] services. Patient returned today c/o weakness, diarrhea, and asking for placement. Patient is uninsured, CM explained that he would be responsible for payment. CSW was notified regarding request for placement. CSW also reiterated that patient would need payment up front. Patient verbalized understanidng  ED evaluation performed patient can weight bear and ambulate with walker. Determined patient not meeting criteria for admission. Patient still had prescriptions, and [place] letter offered to fax to patient's pharmacy he verbalized appreciation. [place] 336 [phone].  [place] services RN /PT/OT were arranged with [facility], and start of care should be tomorrow. Patient states he a rolling walker at home and bedside commode is recommended, patient is agreeable, DME ordered and delivered to the bedside.  ED CM and Dr. Winfred Leeds discussed the disposition plan, and spoke with patient at bedside. Patient is amendable with discharge plan. Patient will call friend to transport home in private vehicle. No further ED CM need identified.

## 2014-11-19 NOTE — Care Management (Signed)
ED CM was consulted by [facility] RN on Pod E regarding patient's friend wanted to speak with the CM. ED CM met with patient and friend at bedside, obtaining consent to discuss patient care in the presence of the friend. Verbal consent given. She verbalized she did not think patient was able to care for self. She also stated, that patient was not able to stay at home after discharge went to hotel due home in [place]. CM  explained to patient and friend that patient does not meet criteria requiring  inpatient stay. ED CM and ED CSW met with patient at bedside reiterated that patient would be responsible for payment to a SNF. Short term respite SNF offered as an option. Patient states, that he is willing to pay for short term, CSW is working on discharge to SNF.  Dr. Rogene Houston updated on the discharge plan. CSW  Will follow up with placement in the morning. No further CM needs identified.

## 2014-11-19 NOTE — ED Provider Notes (Signed)
   Apparently patient's home is uninhabitable periods all the work to have things done and placed in the home to help with the diarrhea and C. difficile infection including getting his prescriptions filled I will not work out. Patient will spend the night in C pod and will be discharged sometime in the morning to a nursing facility. Social worker is on the case.  Fredia Sorrow, MD 11/19/14 1925

## 2014-11-19 NOTE — ED Notes (Signed)
Pt presents to department for evaluation of diarrhea. Was recently admitted to hospital for C. Diff, now states increase in runny bowel movements, unable to care for himself at home. Pt is alert and oriented x4.

## 2014-11-19 NOTE — Progress Notes (Signed)
CSW met with pt this pm to discuss the possibility of privately paying for SNF care.   Pt is interested ingoing to SNF x 1 week to get his strength back before d/c home. Pt maintains that he has the money to pay for his NH care.  CSW is actively searching for SNF respite bed, FL2 has been faxed to [facility] facilities.  [facility] CSW is aware of plan and will f/u in the am.

## 2014-11-19 NOTE — ED Notes (Signed)
Gave pt Ginger Ale, per Junie Panning - RN

## 2014-11-20 MED ORDER — PANTOPRAZOLE SODIUM 40 MG PO TBEC
40.0000 mg | DELAYED_RELEASE_TABLET | Freq: Once | ORAL | Status: AC
  Administered 2014-11-20: 40 mg via ORAL
  Filled 2014-11-20: qty 1

## 2014-11-20 NOTE — ED Notes (Signed)
CSW arranged [facility] on behalf of pt.

## 2014-11-20 NOTE — ED Notes (Signed)
Requested pt medication from pharmacy.

## 2014-11-20 NOTE — ED Notes (Signed)
Requested pt meds from pharmacy.

## 2014-11-20 NOTE — Progress Notes (Signed)
Patient speaking with liaison from [facility] (Tammy) at the bedside. Patient reports his parents will pay for him to go to SNF for continued supervision of care. All monies to be done up front and Tammy will verify fund from parents as patient gave contact information.  All paperwork completed for patient including FL2, passar, and clinicals for review.  Tammy to follow up with LCSW regarding patient's plan.  Patient will require a EMS    All has been completed for patient with regards to placement.  Patient to transfer this afternoon by EMS.  [facility] is prepared to take patient.  Lane Hacker, MSW Clinical Social Work: Emergency Room [phone]

## 2014-11-20 NOTE — Progress Notes (Signed)
Call placed to [facility] (Tammy B) regarding patient and respite status. Tammy aware of patient and agreeable to discuss respite options. Payment would be private and needing to be upfront and only for seven days.  Awaiting review and call back with information. Will present to patient and if able he will go to SNF. If not he will DC home.  Lane Hacker, MSW Clinical Social Work: Emergency Room [phone]

## 2014-11-20 NOTE — ED Notes (Signed)
Pt requesting to speak with care management and this RN told care management and they stated they would go see him.

## 2014-11-21 ENCOUNTER — Telehealth: Payer: Self-pay | Admitting: Family Medicine

## 2014-11-21 ENCOUNTER — Non-Acute Institutional Stay (SKILLED_NURSING_FACILITY): Payer: Self-pay | Admitting: Adult Health

## 2014-11-21 DIAGNOSIS — K219 Gastro-esophageal reflux disease without esophagitis: Secondary | ICD-10-CM

## 2014-11-21 DIAGNOSIS — F411 Generalized anxiety disorder: Secondary | ICD-10-CM

## 2014-11-21 DIAGNOSIS — A0472 Enterocolitis due to Clostridium difficile, not specified as recurrent: Secondary | ICD-10-CM

## 2014-11-21 DIAGNOSIS — K7031 Alcoholic cirrhosis of liver with ascites: Secondary | ICD-10-CM

## 2014-11-21 DIAGNOSIS — I10 Essential (primary) hypertension: Secondary | ICD-10-CM

## 2014-11-21 DIAGNOSIS — A047 Enterocolitis due to Clostridium difficile: Secondary | ICD-10-CM

## 2014-11-21 MED ORDER — VENLAFAXINE HCL ER 150 MG PO CP24: ORAL_CAPSULE | ORAL | Status: DC

## 2014-11-21 NOTE — Telephone Encounter (Signed)
Tammy Blakley call from [facility] to req an rx for venlafaxine XR (EFFEXOR-XR) 150 MG 24 hr capsule   Tammy phone number [phone]   Phamacy: [facility]

## 2014-11-26 ENCOUNTER — Non-Acute Institutional Stay (SKILLED_NURSING_FACILITY): Payer: Self-pay | Admitting: Internal Medicine

## 2014-11-26 DIAGNOSIS — A047 Enterocolitis due to Clostridium difficile: Secondary | ICD-10-CM

## 2014-11-26 DIAGNOSIS — A0472 Enterocolitis due to Clostridium difficile, not specified as recurrent: Secondary | ICD-10-CM

## 2014-11-26 DIAGNOSIS — E038 Other specified hypothyroidism: Secondary | ICD-10-CM

## 2014-11-26 DIAGNOSIS — D62 Acute posthemorrhagic anemia: Secondary | ICD-10-CM

## 2014-11-26 DIAGNOSIS — E034 Atrophy of thyroid (acquired): Secondary | ICD-10-CM

## 2014-11-26 DIAGNOSIS — K7031 Alcoholic cirrhosis of liver with ascites: Secondary | ICD-10-CM

## 2014-11-27 ENCOUNTER — Other Ambulatory Visit: Payer: Self-pay

## 2014-11-27 MED ORDER — OXYCODONE HCL 5 MG PO TABS: 5.0000 mg | ORAL_TABLET | Freq: Four times a day (QID) | ORAL | Status: DC | PRN

## 2014-11-27 NOTE — Telephone Encounter (Signed)
RX faxed to [facility] @ [phone], phone number [phone] 

## 2014-11-27 NOTE — Telephone Encounter (Signed)
error 

## 2014-12-23 ENCOUNTER — Ambulatory Visit: Payer: Self-pay | Admitting: Adult Health

## 2015-01-08 ENCOUNTER — Ambulatory Visit: Payer: Self-pay | Admitting: Adult Health

## 2015-01-14 ENCOUNTER — Encounter: Payer: Self-pay | Admitting: Family Medicine

## 2015-01-14 ENCOUNTER — Ambulatory Visit (INDEPENDENT_AMBULATORY_CARE_PROVIDER_SITE_OTHER): Payer: Self-pay | Admitting: Family Medicine

## 2015-01-14 VITALS — BP 120/70 | Temp 99.0°F | Wt 230.0 lb

## 2015-01-14 DIAGNOSIS — I10 Essential (primary) hypertension: Secondary | ICD-10-CM

## 2015-01-14 DIAGNOSIS — Z23 Encounter for immunization: Secondary | ICD-10-CM

## 2015-01-14 DIAGNOSIS — K7031 Alcoholic cirrhosis of liver with ascites: Secondary | ICD-10-CM

## 2015-01-14 LAB — CBC WITH DIFFERENTIAL/PLATELET
BASOS PCT: 0.5 % (ref 0.0–3.0)
Basophils Absolute: 0 10*3/uL (ref 0.0–0.1)
Eosinophils Absolute: 0.2 10*3/uL (ref 0.0–0.7)
Eosinophils Relative: 2.9 % (ref 0.0–5.0)
HCT: 33.2 % — ABNORMAL LOW (ref 39.0–52.0)
HEMOGLOBIN: 11.1 g/dL — AB (ref 13.0–17.0)
Lymphocytes Relative: 34.6 % (ref 12.0–46.0)
Lymphs Abs: 2.4 10*3/uL (ref 0.7–4.0)
MCHC: 33.5 g/dL (ref 30.0–36.0)
MCV: 94.4 fl (ref 78.0–100.0)
MONO ABS: 0.7 10*3/uL (ref 0.1–1.0)
Monocytes Relative: 10.4 % (ref 3.0–12.0)
NEUTROS ABS: 3.6 10*3/uL (ref 1.4–7.7)
Neutrophils Relative %: 51.6 % (ref 43.0–77.0)
Platelets: 185 10*3/uL (ref 150.0–400.0)
RBC: 3.51 Mil/uL — ABNORMAL LOW (ref 4.22–5.81)
RDW: 15.9 % — ABNORMAL HIGH (ref 11.5–15.5)
WBC: 7 10*3/uL (ref 4.0–10.5)

## 2015-01-14 LAB — HEPATIC FUNCTION PANEL
ALT: 13 U/L (ref 0–53)
AST: 26 U/L (ref 0–37)
Albumin: 3.2 g/dL — ABNORMAL LOW (ref 3.5–5.2)
Alkaline Phosphatase: 117 U/L (ref 39–117)
BILIRUBIN DIRECT: 0.3 mg/dL (ref 0.0–0.3)
BILIRUBIN TOTAL: 0.9 mg/dL (ref 0.2–1.2)
Total Protein: 7.1 g/dL (ref 6.0–8.3)

## 2015-01-14 LAB — BASIC METABOLIC PANEL
BUN: 14 mg/dL (ref 6–23)
CALCIUM: 8.7 mg/dL (ref 8.4–10.5)
CO2: 30 meq/L (ref 19–32)
Chloride: 100 mEq/L (ref 96–112)
Creatinine, Ser: 0.78 mg/dL (ref 0.40–1.50)
GFR: 110.2 mL/min (ref >60.00)
GLUCOSE: 107 mg/dL — AB (ref 70–99)
Potassium: 4.2 mEq/L (ref 3.5–5.1)
Sodium: 138 mEq/L (ref 135–145)

## 2015-01-14 LAB — TSH: TSH: 3.45 u[IU]/mL (ref 0.35–4.50)

## 2015-01-14 MED ORDER — PANTOPRAZOLE SODIUM 40 MG PO TBEC: DELAYED_RELEASE_TABLET | ORAL | Status: DC

## 2015-01-14 MED ORDER — VENLAFAXINE HCL ER 150 MG PO CP24: ORAL_CAPSULE | ORAL | Status: DC

## 2015-01-14 MED ORDER — SPIRONOLACTONE 100 MG PO TABS: 100.0000 mg | ORAL_TABLET | Freq: Every day | ORAL | Status: DC

## 2015-01-14 MED ORDER — LEVOTHYROXINE SODIUM 50 MCG PO TABS
50.0000 ug | ORAL_TABLET | Freq: Every day | ORAL | Status: AC
Start: 2015-01-14 — End: ?

## 2015-01-14 MED ORDER — NADOLOL 40 MG PO TABS: 40.0000 mg | ORAL_TABLET | Freq: Every day | ORAL | Status: AC

## 2015-01-14 NOTE — Progress Notes (Signed)
   Subjective:    Patient ID: Austin Padilla., male    DOB: 04/17/1961, 54 y.o.   MRN: [id number]  HPI [name] is a 54 year old single male nonsmoker who comes in today for follow-up. He is a chronic alcoholic we been seeing him for many years. Last time I saw him was last spring recommend he stop drinking at her rehabilitation which he declined. He was admitted on July 3 and discharged on July 18. He had a 15 day hospital stay for acute renal failure cirrhosis hyperkalemia septic shock acidosis acute blood loss lower GI bleed and C. difficile colitis. He was discharged on Lasix and Aldactone. He says he continues to drink alcohol. He let his insurance lapse in June therefore he had no health insurance when he was in the hospital.  He comes in today for follow-up of the above problems  I talked to him at length as I have in the past about going to [place], psychiatry, mental health,,,,,,,, all of which he thinks he might consider   Review of Systems Review of systems otherwise negative    Objective:   Physical Exam  Well-developed well-nourished male no acute distress vital signs stable he is afebrile BP today 120/90 weight 230 pounds      Assessment & Plan:  Chronic alcoholism.......[name] recheck labs..... Again recommended AA  History of hypothyroidism......... continue Synthroid 50 g recheck labs  No health insurance....... as of June 2016.......[name] refer to the social service department at [facility] hospital for consult  Also recommend he go to the internal medicine clinic at [facility] for future follow-up

## 2015-01-14 NOTE — Patient Instructions (Addendum)
I would recommend that you call AA today  I would recommend you call the hospital and make an appointment to see the social worker to help you through the insurance issues  I would also recommend follow-up with the internal medicine or the family medicine clinic at [facility] hospital  Labs today,,,,,,,,,,,, we will call you the report  Discontinue the Lasix and the florastor   Continue the Aldactone until you see [name]

## 2015-01-14 NOTE — Progress Notes (Signed)
Pre visit review using our clinic review tool, if applicable. No additional management support is needed unless otherwise documented below in the visit note. 

## 2015-01-21 ENCOUNTER — Ambulatory Visit: Payer: Self-pay | Admitting: Gastroenterology

## 2015-02-17 NOTE — Progress Notes (Signed)
Patient ID: Austin Glass., male   DOB: 1960-05-27, 54 y.o.   MRN: [id number]    Facility: [facility]      Allergies  Allergen Reactions  . Metoclopramide Hcl     REACTION: unspecified    Chief Complaint  Patient presents with  . Hospitalization Follow-up    HPI:  He has been hospitalized for c-diff colitis; cirrhosis of liver; alcoholism. He is here for short term rehab with his goals to return back home. He is not voicing any concerns or complaints at this time. He states his stools are improving since starting the abt for his infection.    Past Medical History  Diagnosis Date  . ANXIETY 04/25/2008    Qualifier: Diagnosis of  By: Sherren Mocha MD, Jory Ee   . DEPRESSION 04/25/2008    Qualifier: Diagnosis of  By: Sherren Mocha MD, Jory Ee   . HYPERTENSION 04/25/2008    Qualifier: Diagnosis of  By: Sherren Mocha MD, Jory Ee   . GERD 04/25/2008    Qualifier: Diagnosis of  By: Sherren Mocha MD, Jory Ee   . DIVERTICULOSIS, COLON 06/13/2009    Qualifier: Diagnosis of  By: Sherren Mocha MD, [place], PERIRECTAL 08/05/2008    Qualifier: Diagnosis of  By: Sherlynn Stalls, CMA, [place]    . ABDOMINAL PAIN, ACUTE 05/23/2009    Qualifier: Diagnosis of  By: Sherren Mocha MD, Jory Ee   . Alcoholism   . Essential tremor     Past Surgical History  Procedure Laterality Date  . Perianal abcess    . Tonsillectomy and adenoidectomy    . Esophagogastroduodenoscopy  08/27/2011    Procedure: ESOPHAGOGASTRODUODENOSCOPY (EGD);  Surgeon: Inda Castle, MD;  Location: [facility];  Service: Endoscopy;  Laterality: N/A;  See lab draw orders for pre procedure  . Colonoscopy N/A 11/17/2014    Procedure: COLONOSCOPY;  Surgeon: Juanita Craver, MD;  Location: [facility] ENDOSCOPY;  Service: Endoscopy;  Laterality: N/A;    VITAL SIGNS BP 133/80 mmHg  Pulse 100  Ht 5' 11"  (1.803 m)  Wt 215 lb (97.523 kg)  BMI 30.00 kg/m2  SpO2 96%  Patient's Medications  New Prescriptions   No medications on file  Previous Medications   anusol crm 1 application rectally three times daily    mvi Take one daily    effexor xr 150 mg  Take 150 mg twice  daily    florastor 250 mg  Take twice daily   Lasix 40 mg  Take 40 mg daily   Synthroid 50 mcg take 50 mcg daily    Mag ox 400 mg  Take 400 mg daily   Oxycodone 5 mg  5 mg every 6 hours as needed   protonix 40 mg  Take 40 mg daily   spironolactone 100 mg  Take 100 mg daily   Vancomycin 500 mg  Take 500 mg four times daily through 11-27-14   Flagyl 500 mg  Take 500 mg three times daily through 11-27-14   Nadolol 40 mg  Take 40 mg daily   Modified Medications   No medications on file  Discontinued Medications   No medications on file     SIGNIFICANT DIAGNOSTIC EXAMS  11-04-14: TEE: - Left ventricle: The cavity size was normal. Wall thickness was normal. Systolic function was normal. The estimated ejection fraction was in the range of 60% to 65%. Abnormal diastolic function, indeterminate grade. - Aortic valve: Mildly calcified annulus. Trileaflet; mildly thickened leaflets. - Mitral valve: Mildly calcified annulus.  Mildly thickened leaflets There was mild to moderate regurgitation. The jet is eccentric and posterior directed, this may lead to underestimation of severity. - Left atrium: The atrium was severely dilated. - Right ventricle: The cavity size was mildly dilated. - Right atrium: The atrium was severely dilated.  11-04-14: abdominal ultrasound: Significant sludge within gallbladder without definite shadowing calculi. Cirrhotic appearing liver with mild splenomegaly. Incomplete visualization of pancreas and aorta. Small amount of nonspecific RIGHT perinephric fluid  11-05-14: chest x-ray: 1. Abnormal positioning and kinking of the new left IJ dialysis catheter, as above. 2. No pneumothorax. 3. Worsening aeration since yesterday.   11-06-14: chest x-ray: 1. Focal opacity at the left base could reflect pneumonia or pneumonitis. 2. The remaining central line is in good  position  11-11-14: ct of abdomen and pelvis: No evidence of bowel obstruction, with enteric contrast reaching the transverse colon. Moderate stool burden suggests constipation, or potentially colonic ileus.     Stigmata of cirrhosis and portal hypertension, with hepatosplenomegaly and ascites. Cholelithiasis. If there is concern for acute biliary abnormality, recommend correlation with lab values and patient presentation.    LABS REVIEWED:   11-06-14: wbc 8.3; hgb 7.2; hct 21.3; mcv 97.7; plt 107; glucose 119; bun 50; create 4.95; k+ 4.7; na++129; ast 149; alt 50; alk phos 248 t bili 3.4; albumin 2.6; mag 2.0; free T3: 1.7; free T4: 0.84 11-07-14: iron 41; tibc 252 11-19-14: wbc 6.5; hgb 9.7; hct 29.0; mcv 98.3; plt 297; glucose 104; bun 11; creat 0.99; k+ 3.4; na++132; ast 86; alt 29; alk phos 267; t bili 3.3; albumin 2.4       Review of Systems  Constitutional: Negative for appetite change and fatigue.  HENT: Negative for congestion.   Respiratory: Negative for cough, chest tightness and shortness of breath.   Cardiovascular: Negative for chest pain, palpitations and leg swelling.  Gastrointestinal: Positive for diarrhea. Negative for nausea and abdominal pain.       Is having fewer stools   Musculoskeletal: Negative for myalgias and arthralgias.  Skin: Negative for pallor.  Neurological: Negative for dizziness.  Psychiatric/Behavioral: The patient is not nervous/anxious.       Physical Exam  Vitals reviewed. Constitutional: No distress.  obese  Eyes: Conjunctivae are normal.  Neck: Neck supple. No JVD present. No thyromegaly present.  Cardiovascular: Normal rate, regular rhythm and intact distal pulses.   Respiratory: Effort normal and breath sounds normal. No respiratory distress. He has no wheezes.  GI: Soft. Bowel sounds are normal. He exhibits distension. There is no tenderness.  Mild distention   Musculoskeletal: He exhibits no edema.  Able to move all extremities     Lymphadenopathy:    He has no cervical adenopathy.  Neurological: He is alert.  Skin: Skin is warm and dry. He is not diaphoretic.  Psychiatric: He has a normal mood and affect.       ASSESSMENT/ PLAN:  1. c-diff infection: will have him complete his flagyl and vancomycin; will continue florastor and will monitor his status  2. Hypothyroidism: will continue synthroid 100 mcg daily   3. Hypertension: will continue nadolol 40 mg daily; spironolactone 100 mg daily    4. Depression: will continue effexor xr 105 mg daily   5. Edema: will continue lasix 40 mg daily   6. Gerd: will continue protonix 40 mg daily   7. Alcoholic cirrhosis: he does continue to consume alcohol; while here he won't be able to consume alcohol. Will monitor his liver enzymes are  elevated.      Time spent with patient 50   minutes >50% time spent counseling; reviewing medical record; tests; labs; and developing future plan of care     Ok Edwards NP [facility]  Contact [phone] Monday through Friday 8am- 5pm  After hours call [phone]

## 2015-03-05 ENCOUNTER — Telehealth: Payer: Self-pay | Admitting: Family Medicine

## 2015-03-05 ENCOUNTER — Encounter: Payer: Self-pay | Admitting: Family Medicine

## 2015-03-05 NOTE — Telephone Encounter (Signed)
Pt is travelling to [place] and would like rachel to return his call concerning vaccinations he may need

## 2015-03-05 NOTE — Telephone Encounter (Signed)
reply message sent to [place].

## 2015-03-14 ENCOUNTER — Encounter: Payer: Self-pay | Admitting: Internal Medicine

## 2015-05-25 ENCOUNTER — Encounter: Payer: Self-pay | Admitting: Internal Medicine

## 2015-05-25 NOTE — Progress Notes (Signed)
This encounter was created in error - please disregard.

## 2015-05-25 NOTE — Progress Notes (Signed)
Patient ID: Austin Padilla., male   DOB: 1961/01/10, 55 y.o.   MRN: [id number]    HISTORY AND PHYSICAL   DATE: 11/26/14  Location:  [facility]    Place of Service: SNF [phone])   Extended Emergency Contact Information Primary Emergency Contact: [place] of [place] Phone: [phone] Relation: Father Secondary Emergency Contact: [place] Ours [address] [address]itter of [place] Phone: [phone] Relation: Friend  Advanced Directive information  FULL CODE  Chief Complaint  Patient presents with  . New Admit To SNF    HPI:  55 yo male seen today as a new admission into SNF following hospital stay for C diff colitis, AKI, Etoh cirrhosis, hypothyroidism, LGIB, circulatory shock. He received 4 units PRBCs and 1 unit FFP. Cr improved with dialysis (11.2 -->0.9). Stool was (+) for C diff. He was placed on vanco and flagyl. Colonoscopy on 7/17th revealed multiple left polyps and moderate sized internal hemorrhoids. TSH elevated and he was started on synthroid. Elevated Troponin thought to be due to demand ischemia as 2d echo showed nml EF  Hypothyroidism - last TSH 12.482. Not taking thyroid replacement prior to hospital stay  Hypertension - BP stable on nadolol and spironolactone  Depression - stable on effexor xr  Edema - stable on lasix   GERD - stable on protonix  Alcoholic cirrhosis - ongoing as o/p. Albumin 2.4 with AST 86   Past Medical History  Diagnosis Date  . ANXIETY 04/25/2008    Qualifier: Diagnosis of  By: Sherren Mocha MD, Jory Ee   . DEPRESSION 04/25/2008    Qualifier: Diagnosis of  By: Sherren Mocha MD, Jory Ee   . HYPERTENSION 04/25/2008    Qualifier: Diagnosis of  By: Sherren Mocha MD, Jory Ee   . GERD 04/25/2008    Qualifier: Diagnosis of  By: Sherren Mocha MD, Jory Ee   . DIVERTICULOSIS, COLON 06/13/2009    Qualifier: Diagnosis of  By: Sherren Mocha MD, [place], PERIRECTAL 08/05/2008    Qualifier:  Diagnosis of  By: Sherlynn Stalls, CMA, [place]    . ABDOMINAL PAIN, ACUTE 05/23/2009    Qualifier: Diagnosis of  By: Sherren Mocha MD, Jory Ee   . Alcoholism   . Essential tremor     Past Surgical History  Procedure Laterality Date  . Perianal abcess    . Tonsillectomy and adenoidectomy    . Esophagogastroduodenoscopy  08/27/2011    Procedure: ESOPHAGOGASTRODUODENOSCOPY (EGD);  Surgeon: Inda Castle, MD;  Location: [facility];  Service: Endoscopy;  Laterality: N/A;  See lab draw orders for pre procedure  . Colonoscopy N/A 11/17/2014    Procedure: COLONOSCOPY;  Surgeon: Juanita Craver, MD;  Location: [facility]COPY;  Service: Endoscopy;  Laterality: N/A;    Patient Care Team: Dorena Cookey, MD as PCP - General  Social History   Social History  . Marital Status: Divorced    Spouse Name: N/A  . Number of Children: 0  . Years of Education: N/A   Occupational History  . manager    Social History Main Topics  . Smoking status: Current Every Day Smoker -- 0.50 packs/day    Types: Cigarettes  . Smokeless tobacco: Never Used  . Alcohol Use: Yes     Comment: 4 per day  . Drug Use: No  . Sexual Activity: Not on file   Other Topics Concern  . Not on file   Social History  Narrative     reports that he has been smoking Cigarettes.  He has been smoking about 0.50 packs per day. He has never used smokeless tobacco. He reports that he drinks alcohol. He reports that he does not use illicit drugs.  Family History  Problem Relation Age of Onset  . Stomach cancer Maternal Grandfather   . Hypertension Maternal Uncle     great uncle   No family status information on file.    Immunization History  Administered Date(s) Administered  . Hep A / Hep B 05/08/2012, 05/15/2012  . Influenza Split 05/08/2012  . Influenza Whole 03/22/2007  . Influenza,inj,Quad PF,36+ Mos 12/25/2013, 01/14/2015  . Pneumococcal Polysaccharide-23 11/06/2014    Allergies  Allergen Reactions  . Metoclopramide Hcl      REACTION: unspecified    Medications: Patient's Medications  New Prescriptions   No medications on file  Previous Medications   HYDROCORTISONE (ANUSOL-HC) 2.5 % RECTAL CREAM    Place rectally 3 (three) times daily.   LEVOTHYROXINE (SYNTHROID, LEVOTHROID) 50 MCG TABLET    Take 1 tablet (50 mcg total) by mouth daily before breakfast.   MULTIPLE VITAMINS-MINERALS (CENTRUM PO)    Take 1 tablet by mouth daily.   NADOLOL (CORGARD) 40 MG TABLET    Take 1 tablet (40 mg total) by mouth daily.   OXYCODONE (OXY IR/ROXICODONE) 5 MG IMMEDIATE RELEASE TABLET    Take 1 tablet (5 mg total) by mouth every 6 (six) hours as needed for severe pain.   PANTOPRAZOLE (PROTONIX) 40 MG TABLET    TAKE 1 TABLET DAILY.   SPIRONOLACTONE (ALDACTONE) 100 MG TABLET    Take 1 tablet (100 mg total) by mouth daily.   VENLAFAXINE XR (EFFEXOR-XR) 150 MG 24 HR CAPSULE    TAKE (1) CAPSULE TWICE DAILY.  Modified Medications   No medications on file  Discontinued Medications   No medications on file    Review of Systems  Unable to perform ROS: Other  memory loss   There were no vitals filed for this visit. There is no weight on file to calculate BMI.  Physical Exam  Constitutional: He appears well-developed.  Sitting on bed in NAD, frail appearing  HENT:  Mouth/Throat: Oropharynx is clear and moist.  Eyes: Pupils are equal, round, and reactive to light. No scleral icterus.  Neck: Neck supple. Carotid bruit is not present. No thyromegaly present.  Cardiovascular: Normal rate, regular rhythm, normal heart sounds and intact distal pulses.  Exam reveals no gallop and no friction rub.   No murmur heard. no distal LE swelling. No calf TTP  Pulmonary/Chest: Effort normal and breath sounds normal. He has no wheezes. He has no rales. He exhibits no tenderness.  Abdominal: Soft. Bowel sounds are normal. He exhibits no distension, no pulsatile liver, no fluid wave, no abdominal bruit, no pulsatile midline mass and no mass. There  is hepatomegaly. There is no tenderness. There is no rebound and no guarding.  Lymphadenopathy:    He has no cervical adenopathy.  Neurological: He is alert.  Skin: Skin is warm and dry. No rash noted.  Psychiatric: He has a normal mood and affect. His behavior is normal.     Labs reviewed: Admission on 11/19/2014, Discharged on 11/20/2014  Component Date Value Ref Range Status  . WBC 11/19/2014 6.5  4.0 - 10.5 K/uL Final  . RBC 11/19/2014 2.95* 4.22 - 5.81 MIL/uL Final  . Hemoglobin 11/19/2014 9.7* 13.0 - 17.0 g/dL Final  . HCT 11/19/2014 29.0* 39.0 -  52.0 % Final  . MCV 11/19/2014 98.3  78.0 - 100.0 fL Final  . MCH 11/19/2014 32.9  26.0 - 34.0 pg Final  . MCHC 11/19/2014 33.4  30.0 - 36.0 g/dL Final  . RDW 11/19/2014 18.3* 11.5 - 15.5 % Final  . Platelets 11/19/2014 297  150 - 400 K/uL Final  . Neutrophils Relative % 11/19/2014 62  43 - 77 % Final  . Neutro Abs 11/19/2014 4.1  1.7 - 7.7 K/uL Final  . Lymphocytes Relative 11/19/2014 24  12 - 46 % Final  . Lymphs Abs 11/19/2014 1.6  0.7 - 4.0 K/uL Final  . Monocytes Relative 11/19/2014 9  3 - 12 % Final  . Monocytes Absolute 11/19/2014 0.6  0.1 - 1.0 K/uL Final  . Eosinophils Relative 11/19/2014 4  0 - 5 % Final  . Eosinophils Absolute 11/19/2014 0.2  0.0 - 0.7 K/uL Final  . Basophils Relative 11/19/2014 1  0 - 1 % Final  . Basophils Absolute 11/19/2014 0.1  0.0 - 0.1 K/uL Final  . Sodium 11/19/2014 132* 135 - 145 mmol/L Final  . Potassium 11/19/2014 3.4* 3.5 - 5.1 mmol/L Final  . Chloride 11/19/2014 104  101 - 111 mmol/L Final  . CO2 11/19/2014 18* 22 - 32 mmol/L Final  . Glucose, Bld 11/19/2014 104* 65 - 99 mg/dL Final  . BUN 11/19/2014 11  6 - 20 mg/dL Final  . Creatinine, Ser 11/19/2014 0.99  0.61 - 1.24 mg/dL Final  . Calcium 11/19/2014 8.7* 8.9 - 10.3 mg/dL Final  . Total Protein 11/19/2014 7.4  6.5 - 8.1 g/dL Final  . Albumin 11/19/2014 2.4* 3.5 - 5.0 g/dL Final  . AST 11/19/2014 86* 15 - 41 U/L Final  . ALT  11/19/2014 29  17 - 63 U/L Final  . Alkaline Phosphatase 11/19/2014 267* 38 - 126 U/L Final  . Total Bilirubin 11/19/2014 3.3* 0.3 - 1.2 mg/dL Final  . GFR calc non Af Amer 11/19/2014 >60  >60 mL/min Final  . GFR calc Af Amer 11/19/2014 >60  >60 mL/min Final   Comment: (NOTE) The eGFR has been calculated using the CKD EPI equation. This calculation has not been validated in all clinical situations. eGFR's persistently <60 mL/min signify possible Chronic Kidney Disease.   . Anion gap 11/19/2014 10  5 - 15 Final  Admission on 11/03/2014, Discharged on 11/18/2014  No results displayed because visit has over 200 results.      No results found.   Assessment/Plan   ICD-9-CM ICD-10-CM   1. C. difficile colitis 008.45 A04.7   2. Alcoholic cirrhosis of liver with ascites (HCC) 571.2 K70.31   3. Acute blood loss anemia due to LGIB 285.1 D62   4.         Hypothyroidism due to acquired atrophy of thyroid  Complete vanco and flagyl tx on 7/27th  Cont PT/OT as ordered  Start robaxin 531m TID prn muscle spasm  Cont current meds as ordered  Reck CMP, Mg. check TSH in 4 weeks  GOAL: short term rehab and d/c home when medically appropriate. Communicated with pt and nursing.  Will follow  Janyah Singleterry S. CPerlie Gold PGastrointestinal Diagnostic [facility]oscopy [facility] LLC[facility] Adult [facility] [address]GIps[address] [place] [phone] (Monday-Friday 8 AM - 5 PM) ([phone] 5 PM and follow prompts

## 2015-06-17 ENCOUNTER — Ambulatory Visit (INDEPENDENT_AMBULATORY_CARE_PROVIDER_SITE_OTHER): Payer: BLUE CROSS/BLUE SHIELD | Admitting: Family Medicine

## 2015-06-17 ENCOUNTER — Encounter: Payer: Self-pay | Admitting: Family Medicine

## 2015-06-17 VITALS — BP 130/80 | Temp 99.0°F | Wt 252.0 lb

## 2015-06-17 DIAGNOSIS — L02212 Cutaneous abscess of back [any part, except buttock]: Secondary | ICD-10-CM | POA: Diagnosis not present

## 2015-06-17 NOTE — Progress Notes (Signed)
   Subjective:    Patient ID: Austin Padilla., male    DOB: 1961/02/22, 55 y.o.   MRN: [id number]  HPI Austin Padilla is a 55 year old divorce male nonsmoker chronic alcoholic who continues to drink who comes in today because of an abscess of his back  He noticed a draining lesion left upper back about 2 weeks ago. It stopped draining then it recurred.  He's also complaining sore throat for a week no fever chills etc. etc.   Review of Systems Review of systems negative except he continues to drink    Objective:   Physical Exam Well-developed obese male with a very protuberant abdomen consistent with alcoholic cirrhosis  Examination of back shows a abscess left upper back.  He was taken the treatment room after informed consent lesion was anesthetized with 1% Xylocaine with epinephrine and cleaned with Betadine and alcohol. The abscess was drained scar tissue was removed Band-Aids was applied after packing with sterile packing       Assessment & Plan:  Abscess right upper back,,,,,,,,,, procedure I&D and draining,,,,,,,, return in 3 days to remove the packing

## 2015-06-17 NOTE — Progress Notes (Signed)
Pre visit review using our clinic review tool, if applicable. No additional management support is needed unless otherwise documented below in the visit note. 

## 2015-06-17 NOTE — Patient Instructions (Signed)
Keep the area clean and dry  On Friday remove the bandage and take out the packing  Clean the area 3 times daily with a Q-tip and antibiotic ointment until completely heals

## 2015-09-02 ENCOUNTER — Telehealth: Payer: Self-pay | Admitting: Family Medicine

## 2015-09-02 NOTE — Telephone Encounter (Signed)
Pt would like to see Dr Sherren Mocha about side effects of a certain medicine he is on, and perhaps starting a new med. If pt cannot see Dr Sherren Mocha before he leaves, pt would like to know who Dr Sherren Mocha recommends to take care of him this summer while Dr Sherren Mocha is away.

## 2015-09-03 NOTE — Telephone Encounter (Signed)
Attempted to call patient but the mailbox is full.  Will try again at a later time.

## 2015-09-04 NOTE — Telephone Encounter (Signed)
Left message on machine returning patient's call 

## 2015-09-05 NOTE — Telephone Encounter (Signed)
Pt returned your call.  

## 2015-09-05 NOTE — Telephone Encounter (Signed)
Patient will call back for a follow up medication visit this summer.

## 2015-11-17 ENCOUNTER — Ambulatory Visit: Payer: BLUE CROSS/BLUE SHIELD | Admitting: Family Medicine

## 2015-11-18 ENCOUNTER — Telehealth: Payer: Self-pay | Admitting: *Deleted

## 2015-11-18 NOTE — Telephone Encounter (Signed)
Dr Maudie Mercury stated she reviewed the pts chart prior to his visit on Thursday and asked that I call to see how he is feeling and ask a few other questions. I left a message for the pt to return my call.

## 2015-11-20 ENCOUNTER — Inpatient Hospital Stay (HOSPITAL_COMMUNITY)
Admission: EM | Admit: 2015-11-20 | Discharge: 2015-12-06 | DRG: 683 | Disposition: A | Discharge status: Home / Self Care | Payer: BLUE CROSS/BLUE SHIELD | Attending: Family Medicine | Admitting: Family Medicine

## 2015-11-20 ENCOUNTER — Ambulatory Visit (INDEPENDENT_AMBULATORY_CARE_PROVIDER_SITE_OTHER): Payer: BLUE CROSS/BLUE SHIELD | Admitting: Family Medicine

## 2015-11-20 ENCOUNTER — Encounter: Payer: Self-pay | Admitting: Family Medicine

## 2015-11-20 ENCOUNTER — Encounter (HOSPITAL_COMMUNITY): Payer: Self-pay

## 2015-11-20 ENCOUNTER — Telehealth: Payer: Self-pay | Admitting: Family Medicine

## 2015-11-20 VITALS — BP 140/70 | HR 108 | Temp 99.0°F | Ht 71.0 in | Wt 261.0 lb

## 2015-11-20 DIAGNOSIS — N170 Acute kidney failure with tubular necrosis: Principal | ICD-10-CM | POA: Diagnosis present

## 2015-11-20 DIAGNOSIS — N183 Chronic kidney disease, stage 3 (moderate): Secondary | ICD-10-CM | POA: Diagnosis present

## 2015-11-20 DIAGNOSIS — D696 Thrombocytopenia, unspecified: Secondary | ICD-10-CM | POA: Diagnosis present

## 2015-11-20 DIAGNOSIS — R69 Illness, unspecified: Secondary | ICD-10-CM

## 2015-11-20 DIAGNOSIS — Z6838 Body mass index (BMI) 38.0-38.9, adult: Secondary | ICD-10-CM

## 2015-11-20 DIAGNOSIS — G250 Essential tremor: Secondary | ICD-10-CM | POA: Diagnosis present

## 2015-11-20 DIAGNOSIS — R188 Other ascites: Secondary | ICD-10-CM

## 2015-11-20 DIAGNOSIS — D62 Acute posthemorrhagic anemia: Secondary | ICD-10-CM | POA: Diagnosis present

## 2015-11-20 DIAGNOSIS — I8510 Secondary esophageal varices without bleeding: Secondary | ICD-10-CM | POA: Diagnosis present

## 2015-11-20 DIAGNOSIS — N19 Unspecified kidney failure: Secondary | ICD-10-CM

## 2015-11-20 DIAGNOSIS — R531 Weakness: Secondary | ICD-10-CM

## 2015-11-20 DIAGNOSIS — E876 Hypokalemia: Secondary | ICD-10-CM | POA: Diagnosis present

## 2015-11-20 DIAGNOSIS — F17210 Nicotine dependence, cigarettes, uncomplicated: Secondary | ICD-10-CM | POA: Diagnosis present

## 2015-11-20 DIAGNOSIS — I129 Hypertensive chronic kidney disease with stage 1 through stage 4 chronic kidney disease, or unspecified chronic kidney disease: Secondary | ICD-10-CM | POA: Diagnosis present

## 2015-11-20 DIAGNOSIS — F1020 Alcohol dependence, uncomplicated: Secondary | ICD-10-CM | POA: Diagnosis present

## 2015-11-20 DIAGNOSIS — E871 Hypo-osmolality and hyponatremia: Secondary | ICD-10-CM | POA: Diagnosis present

## 2015-11-20 DIAGNOSIS — K219 Gastro-esophageal reflux disease without esophagitis: Secondary | ICD-10-CM | POA: Diagnosis present

## 2015-11-20 DIAGNOSIS — K3189 Other diseases of stomach and duodenum: Secondary | ICD-10-CM | POA: Diagnosis present

## 2015-11-20 DIAGNOSIS — N179 Acute kidney failure, unspecified: Secondary | ICD-10-CM | POA: Diagnosis present

## 2015-11-20 DIAGNOSIS — K2970 Gastritis, unspecified, without bleeding: Secondary | ICD-10-CM | POA: Diagnosis present

## 2015-11-20 DIAGNOSIS — E8809 Other disorders of plasma-protein metabolism, not elsewhere classified: Secondary | ICD-10-CM | POA: Diagnosis present

## 2015-11-20 DIAGNOSIS — M545 Low back pain, unspecified: Secondary | ICD-10-CM | POA: Diagnosis present

## 2015-11-20 DIAGNOSIS — R0600 Dyspnea, unspecified: Secondary | ICD-10-CM | POA: Diagnosis not present

## 2015-11-20 DIAGNOSIS — E8770 Fluid overload, unspecified: Secondary | ICD-10-CM

## 2015-11-20 DIAGNOSIS — N390 Urinary tract infection, site not specified: Secondary | ICD-10-CM | POA: Diagnosis present

## 2015-11-20 DIAGNOSIS — E875 Hyperkalemia: Secondary | ICD-10-CM | POA: Diagnosis present

## 2015-11-20 DIAGNOSIS — F419 Anxiety disorder, unspecified: Secondary | ICD-10-CM | POA: Diagnosis present

## 2015-11-20 DIAGNOSIS — R000 Tachycardia, unspecified: Secondary | ICD-10-CM

## 2015-11-20 DIAGNOSIS — E039 Hypothyroidism, unspecified: Secondary | ICD-10-CM | POA: Diagnosis present

## 2015-11-20 DIAGNOSIS — E7220 Disorder of urea cycle metabolism, unspecified: Secondary | ICD-10-CM | POA: Clinically undetermined

## 2015-11-20 DIAGNOSIS — Z8249 Family history of ischemic heart disease and other diseases of the circulatory system: Secondary | ICD-10-CM

## 2015-11-20 DIAGNOSIS — K5900 Constipation, unspecified: Secondary | ICD-10-CM | POA: Diagnosis present

## 2015-11-20 DIAGNOSIS — K259 Gastric ulcer, unspecified as acute or chronic, without hemorrhage or perforation: Secondary | ICD-10-CM | POA: Diagnosis present

## 2015-11-20 DIAGNOSIS — E872 Acidosis: Secondary | ICD-10-CM

## 2015-11-20 DIAGNOSIS — Z79899 Other long term (current) drug therapy: Secondary | ICD-10-CM

## 2015-11-20 DIAGNOSIS — I8500 Esophageal varices without bleeding: Secondary | ICD-10-CM | POA: Diagnosis present

## 2015-11-20 DIAGNOSIS — I10 Essential (primary) hypertension: Secondary | ICD-10-CM | POA: Diagnosis present

## 2015-11-20 DIAGNOSIS — K7030 Alcoholic cirrhosis of liver without ascites: Secondary | ICD-10-CM | POA: Diagnosis present

## 2015-11-20 DIAGNOSIS — K766 Portal hypertension: Secondary | ICD-10-CM | POA: Diagnosis present

## 2015-11-20 DIAGNOSIS — D6959 Other secondary thrombocytopenia: Secondary | ICD-10-CM | POA: Diagnosis present

## 2015-11-20 DIAGNOSIS — D649 Anemia, unspecified: Secondary | ICD-10-CM | POA: Diagnosis present

## 2015-11-20 DIAGNOSIS — K7031 Alcoholic cirrhosis of liver with ascites: Secondary | ICD-10-CM | POA: Diagnosis present

## 2015-11-20 DIAGNOSIS — D638 Anemia in other chronic diseases classified elsewhere: Secondary | ICD-10-CM | POA: Diagnosis present

## 2015-11-20 DIAGNOSIS — F329 Major depressive disorder, single episode, unspecified: Secondary | ICD-10-CM | POA: Diagnosis present

## 2015-11-20 HISTORY — DX: Alcoholic cirrhosis of liver without ascites: K70.30

## 2015-11-20 HISTORY — DX: Polyp of colon: K63.5

## 2015-11-20 HISTORY — DX: Hypothyroidism, unspecified: E03.9

## 2015-11-20 LAB — COMPREHENSIVE METABOLIC PANEL
ALT: 14 U/L — AB (ref 17–63)
AST: 36 U/L (ref 15–41)
Albumin: 2.9 g/dL — ABNORMAL LOW (ref 3.5–5.0)
Alkaline Phosphatase: 163 U/L — ABNORMAL HIGH (ref 38–126)
Anion gap: 10 (ref 5–15)
BUN: 48 mg/dL — ABNORMAL HIGH (ref 6–20)
CALCIUM: 8.8 mg/dL — AB (ref 8.9–10.3)
CHLORIDE: 104 mmol/L (ref 101–111)
CO2: 17 mmol/L — ABNORMAL LOW (ref 22–32)
Creatinine, Ser: 4.82 mg/dL — ABNORMAL HIGH (ref 0.61–1.24)
GFR calc Af Amer: 14 mL/min — ABNORMAL LOW (ref >60)
GFR, EST NON AFRICAN AMERICAN: 12 mL/min — AB (ref >60)
Glucose, Bld: 97 mg/dL (ref 65–99)
Potassium: 5.1 mmol/L (ref 3.5–5.1)
SODIUM: 131 mmol/L — AB (ref 135–145)
TOTAL PROTEIN: 7.6 g/dL (ref 6.5–8.1)
Total Bilirubin: 2.2 mg/dL — ABNORMAL HIGH (ref 0.3–1.2)

## 2015-11-20 LAB — CBG MONITORING, ED: Glucose-Capillary: 87 mg/dL (ref 65–99)

## 2015-11-20 LAB — CBC
HEMATOCRIT: 22.5 % — AB (ref 39.0–52.0)
Hemoglobin: 7.4 g/dL — ABNORMAL LOW (ref 13.0–17.0)
MCH: 33.2 pg (ref 26.0–34.0)
MCHC: 32.9 g/dL (ref 30.0–36.0)
MCV: 100.9 fL — AB (ref 78.0–100.0)
Platelets: 104 10*3/uL — ABNORMAL LOW (ref 150–400)
RBC: 2.23 MIL/uL — ABNORMAL LOW (ref 4.22–5.81)
RDW: 17.9 % — AB (ref 11.5–15.5)
WBC: 5 10*3/uL (ref 4.0–10.5)

## 2015-11-20 LAB — URINE MICROSCOPIC-ADD ON: Squamous Epithelial / LPF: NONE SEEN

## 2015-11-20 LAB — URINALYSIS, ROUTINE W REFLEX MICROSCOPIC
GLUCOSE, UA: NEGATIVE mg/dL
KETONES UR: 15 mg/dL — AB
Nitrite: POSITIVE — AB
PH: 6 (ref 5.0–8.0)
Protein, ur: 100 mg/dL — AB
Specific Gravity, Urine: 1.012 (ref 1.005–1.030)

## 2015-11-20 LAB — PROTIME-INR
INR: 1.42 (ref 0.00–1.49)
PROTHROMBIN TIME: 17.4 s — AB (ref 11.6–15.2)

## 2015-11-20 LAB — ABO/RH: ABO/RH(D): B POS

## 2015-11-20 LAB — APTT: aPTT: 33 seconds (ref 24–37)

## 2015-11-20 MED ORDER — LORAZEPAM 1 MG PO TABS
1.0000 mg | ORAL_TABLET | Freq: Four times a day (QID) | ORAL | Status: AC | PRN
  Administered 2015-11-23 (×2): 1 mg via ORAL
  Filled 2015-11-20 (×2): qty 1

## 2015-11-20 MED ORDER — ADULT MULTIVITAMIN W/MINERALS CH
1.0000 | ORAL_TABLET | Freq: Every day | ORAL | Status: DC
  Administered 2015-11-21 – 2015-12-06 (×15): 1 via ORAL
  Filled 2015-11-20 (×16): qty 1

## 2015-11-20 MED ORDER — ACETAMINOPHEN 325 MG PO TABS
650.0000 mg | ORAL_TABLET | Freq: Four times a day (QID) | ORAL | Status: DC | PRN
  Administered 2015-11-24: 650 mg via ORAL
  Filled 2015-11-20: qty 2

## 2015-11-20 MED ORDER — ACETAMINOPHEN 650 MG RE SUPP: 650.0000 mg | Freq: Four times a day (QID) | RECTAL | Status: DC | PRN

## 2015-11-20 MED ORDER — VENLAFAXINE HCL ER 150 MG PO CP24
150.0000 mg | ORAL_CAPSULE | Freq: Every day | ORAL | Status: DC
  Administered 2015-11-21 – 2015-12-06 (×16): 150 mg via ORAL
  Filled 2015-11-20 (×17): qty 1

## 2015-11-20 MED ORDER — PANTOPRAZOLE SODIUM 40 MG PO TBEC
40.0000 mg | DELAYED_RELEASE_TABLET | Freq: Two times a day (BID) | ORAL | Status: DC
  Administered 2015-11-21 – 2015-12-06 (×31): 40 mg via ORAL
  Filled 2015-11-20 (×31): qty 1

## 2015-11-20 MED ORDER — NADOLOL 40 MG PO TABS
40.0000 mg | ORAL_TABLET | Freq: Every day | ORAL | Status: DC
  Administered 2015-11-21 – 2015-12-06 (×16): 40 mg via ORAL
  Filled 2015-11-20 (×17): qty 1

## 2015-11-20 MED ORDER — LORAZEPAM 2 MG/ML IJ SOLN: 1.0000 mg | Freq: Four times a day (QID) | INTRAMUSCULAR | Status: AC | PRN

## 2015-11-20 MED ORDER — SODIUM CHLORIDE 0.9 % IV SOLN
INTRAVENOUS | Status: DC
  Administered 2015-11-20: 20:00:00 via INTRAVENOUS

## 2015-11-20 MED ORDER — LEVOTHYROXINE SODIUM 50 MCG PO TABS
50.0000 ug | ORAL_TABLET | Freq: Every day | ORAL | Status: DC
  Administered 2015-11-21 – 2015-12-06 (×16): 50 ug via ORAL
  Filled 2015-11-20 (×15): qty 1

## 2015-11-20 MED ORDER — FOLIC ACID 1 MG PO TABS
1.0000 mg | ORAL_TABLET | Freq: Every day | ORAL | Status: DC
  Administered 2015-11-21 – 2015-12-06 (×15): 1 mg via ORAL
  Filled 2015-11-20 (×15): qty 1

## 2015-11-20 MED ORDER — VITAMIN B-1 100 MG PO TABS
100.0000 mg | ORAL_TABLET | Freq: Every day | ORAL | Status: DC
  Administered 2015-11-21 – 2015-12-06 (×16): 100 mg via ORAL
  Filled 2015-11-20 (×16): qty 1

## 2015-11-20 MED ORDER — THIAMINE HCL 100 MG/ML IJ SOLN
100.0000 mg | Freq: Every day | INTRAMUSCULAR | Status: DC
  Filled 2015-11-20: qty 2

## 2015-11-20 MED ORDER — ALBUMIN HUMAN 25 % IV SOLN
50.0000 g | Freq: Once | INTRAVENOUS | Status: AC
  Administered 2015-11-20: 50 g via INTRAVENOUS
  Filled 2015-11-20: qty 200

## 2015-11-20 NOTE — Progress Notes (Signed)
NO SHOW

## 2015-11-20 NOTE — Progress Notes (Signed)
HPI:  Austin Padilla. is a 55 yo patient of Dr. Honor Junes with known alcoholic liver cirrhosis here for an acute visit for abd swelling. He no showed/late canceled his visit a few days ago for the same, and then late cancelled his appointment this morning due to an issues with adult diapers per telephone note? He reports progressive swelling of the abdomen over the last 4 weeks. He has become quite weak and sick over the last weeks, with anorexia, weakness, a fall, dizziness, shortness of breath, severe bilateral lower extremity edema, dyspnea on exertion and general malaise. He reports it now has become difficult to ambulate and his abdomen continues to swell and is quite uncomfortable. He also has developed a cough, DOE and nausea today. Denies constipation, bleeding, altered mental status and hallucinations.   ROS: See pertinent positives and negatives per HPI.  Past Medical History  Diagnosis Date  . ANXIETY 04/25/2008    Qualifier: Diagnosis of  By: Sherren Mocha MD, Jory Ee   . DEPRESSION 04/25/2008    Qualifier: Diagnosis of  By: Sherren Mocha MD, Jory Ee   . HYPERTENSION 04/25/2008    Qualifier: Diagnosis of  By: Sherren Mocha MD, Jory Ee   . GERD 04/25/2008    Qualifier: Diagnosis of  By: Sherren Mocha MD, Jory Ee   . DIVERTICULOSIS, COLON 06/13/2009    Qualifier: Diagnosis of  By: Sherren Mocha MD, [place], PERIRECTAL 08/05/2008    Qualifier: Diagnosis of  By: Sherlynn Stalls, CMA, [place]    . ABDOMINAL PAIN, ACUTE 05/23/2009    Qualifier: Diagnosis of  By: Sherren Mocha MD, Jory Ee   . Alcoholism ([place])   . Essential tremor     Past Surgical History  Procedure Laterality Date  . Perianal abcess    . Tonsillectomy and adenoidectomy    . Esophagogastroduodenoscopy  08/27/2011    Procedure: ESOPHAGOGASTRODUODENOSCOPY (EGD);  Surgeon: Inda Castle, MD;  Location: [facility];  Service: Endoscopy;  Laterality: N/A;  See lab draw orders for pre procedure  . Colonoscopy N/A 11/17/2014    Procedure: COLONOSCOPY;   Surgeon: Juanita Craver, MD;  Location: [facility] END[facility]COPY;  Service: Endoscopy;  Laterality: N/A;    Family History  Problem Relation Age of Onset  . Stomach cancer Maternal Grandfather   . Hypertension Maternal Uncle     great uncle    Social History   Social History  . Marital Status: Divorced    Spouse Name: N/A  . Number of Children: 0  . Years of Education: N/A   Occupational History  . manager    Social History Main Topics  . Smoking status: Current Every Day Smoker -- 0.50 packs/day    Types: Cigarettes  . Smokeless tobacco: Never Used  . Alcohol Use: Yes     Comment: 4 per day  . Drug Use: No  . Sexual Activity: Not Asked   Other Topics Concern  . None   Social History Narrative     Current outpatient prescriptions:  .  levothyroxine (SYNTHROID, LEVOTHROID) 50 MCG tablet, Take 1 tablet (50 mcg total) by mouth daily before breakfast., Disp: 100 tablet, Rfl: 3 .  Multiple Vitamins-Minerals (CENTRUM PO), Take 1 tablet by mouth daily., Disp: , Rfl:  .  nadolol (CORGARD) 40 MG tablet, Take 1 tablet (40 mg total) by mouth daily., Disp: 100 tablet, Rfl: 3 .  pantoprazole (PROTONIX) 40 MG tablet, TAKE 1 TABLET DAILY., Disp: 100 tablet, Rfl: 3 .  spironolactone (ALDACTONE) 100 MG tablet, Take 1  tablet (100 mg total) by mouth daily., Disp: 100 tablet, Rfl: 3 .  venlafaxine XR (EFFEXOR-XR) 150 MG 24 hr capsule, TAKE (1) CAPSULE TWICE DAILY., Disp: 90 capsule, Rfl: 3  EXAM:  Filed Vitals:   11/20/15 1701  BP: 140/70  Pulse: 108  Temp: 99 F (37.2 C)    Body mass index is 36.42 kg/(m^2).  GENERAL: vitals reviewed and listed above, alert, oriented, appears frail, pale and jaundiced, has difficulty with transfers   HEENT: atraumatic, no obvious abnormalities on inspection of external nose and ears, uremic fetor  LUNGS: Decreased lung sounds at bases   CV: tachycardic, bilateral lower extremity edema, JVD  ABDOMEN: severely distended abdomen with massive  ascites  MS: moves slowly and cautiously, difficulty with ambulation evan short distances and with transfers  PSYCH: pleasant and cooperative, no obvious depression or anxiety  ASSESSMENT AND PLAN:  Discussed the following assessment and plan:  Weakness  Tachycardia  Ascites  Dyspnea  -advise eval at hospital, needs labs, imaging, paracentesis -advised EMS transport given degree of weakness, he declined -staff to notify hospital -Patient advised to return or notify a doctor immediately if symptoms worsen or persist or new concerns arise.  There are no Patient Instructions on file for this visit.  Colin Benton R., DO

## 2015-11-20 NOTE — ED Notes (Signed)
Pt sent by PCP, Maudie Mercury MD.  C/o increasing ascites x 3 weeks and fall last week.  Hx of liver failure.

## 2015-11-20 NOTE — H&P (Signed)
History and Physical  Patient Name: Austin Padilla.     [id number]    DOB: 07-23-1960    DOA: 11/20/2015 PCP: Joycelyn Man, MD   Patient coming from: Home  Chief Complaint: Weakness and ascites  HPI: Austin Camelo. is a 55 y.o. 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  The patient was in his usual state of health until about one month ago when he started service decreased appetite, increased swelling in the belly, and weakness. The weakness has progressed to be severe, so that he needs a cane, cannot stand in the kitchen long enough to make himself dinner, and gets short of breath with exertion.  Now over the last 2 weeks he has had dark urine, oliguria, and marked pedal edema. Today he went to his PCP, who recommended he be evaluated at the hospital.  ED course: -Afebrile, heart rate 100, respirations and oxygen saturation normal, slightly hypertensive -Na 131, K 5.1, Cr 4.8 (baseline 0.7 a year ago), WBC 5 K, Hgb 7.4, previuos baseline normal -INR normal, platelets 104K, albumin 2.9   The patient cirrhosis is thought to be from alcohol. He cut down his alcohol, but still drinks 4 standard spirits beverages per day. He is an active smoker. He lives alone. His GI doctor was [name] at [facility].  He does not use NSAIDs. He has had some nausea and emesis recently, but no coffee-ground emesis, no melena, no hematochezia.       ROS: All other systems negative except as just noted or noted in the history of present illness.    Past Medical History  Diagnosis Date  . ANXIETY 04/25/2008    Qualifier: Diagnosis of  By: Sherren Mocha MD, Jory Ee   . DEPRESSION 04/25/2008    Qualifier: Diagnosis of  By: Sherren Mocha MD, Jory Ee   . HYPERTENSION 04/25/2008    Qualifier: Diagnosis of  By: Sherren Mocha MD, Jory Ee   . GERD 04/25/2008    Qualifier: Diagnosis of  By:  Sherren Mocha MD, Jory Ee   . DIVERTICULOSIS, COLON 06/13/2009    Qualifier: Diagnosis of  By: Sherren Mocha MD, [place], PERIRECTAL 08/05/2008    Qualifier: Diagnosis of  By: Sherlynn Stalls, CMA, [place]    . ABDOMINAL PAIN, ACUTE 05/23/2009    Qualifier: Diagnosis of  By: Sherren Mocha MD, Jory Ee   . Alcoholism ([place])   . Essential tremor     Past Surgical History  Procedure Laterality Date  . Perianal abcess    . Tonsillectomy and adenoidectomy    . Esophagogastroduodenoscopy  08/27/2011    Procedure: ESOPHAGOGASTRODUODENOSCOPY (EGD);  Surgeon: Inda Castle, MD;  Location: [facility];  Service: Endoscopy;  Laterality: N/A;  See lab draw orders for pre procedure  . Colonoscopy N/A 11/17/2014    Procedure: COLONOSCOPY;  Surgeon: Juanita Craver, MD;  Location: [facility] ENDOSCOPY;  Service: Endoscopy;  Laterality: N/A;    Social History: Patient lives alone.  The patient walks with a cane over the last month, still drives.  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  He is from [place], [place] originally.  He smokes.  He drinks 4 beverages daily, estimate.    Allergies  Allergen Reactions  . Metoclopramide Hcl     REACTION: unspecified    Family history: family history includes Hypertension in his maternal uncle; Stomach cancer  in his maternal grandfather.  Prior to Admission medications   Medication Sig Start Date End Date Taking? Authorizing Provider  levothyroxine (SYNTHROID, LEVOTHROID) 50 MCG tablet Take 1 tablet (50 mcg total) by mouth daily before breakfast. 01/14/15  Yes Dorena Cookey, MD  Multiple Vitamins-Minerals (CENTRUM PO) Take 1 tablet by mouth daily.   Yes Historical Provider, MD  nadolol (CORGARD) 40 MG tablet Take 1 tablet (40 mg total) by mouth daily. 01/14/15  Yes Dorena Cookey, MD  pantoprazole (PROTONIX) 40 MG tablet TAKE 1 TABLET DAILY. Patient taking differently: Take 40 mg by mouth daily. TAKE 1 TABLET DAILY. 01/14/15  Yes Dorena Cookey, MD  spironolactone (ALDACTONE)  100 MG tablet Take 1 tablet (100 mg total) by mouth daily. 01/14/15  Yes Dorena Cookey, MD  venlafaxine XR (EFFEXOR-XR) 150 MG 24 hr capsule TAKE (1) CAPSULE TWICE DAILY. 01/14/15  Yes Dorena Cookey, MD       Physical Exam: BP 159/85 mmHg  Pulse 100  Temp(Src) 98.1 F (36.7 C) (Oral)  Resp 20  SpO2 95% General appearance: Older male, alert and in no acute distress.   Eyes: Conjunctiva normal, lids and lashes normal.   Mild icterus.  PERRL.  ENT: No nasal deformity, discharge.  OP moist without lesions.   Lymph: No cervical or supraclavicular lymphadenopathy. Skin: Warm and dry.  Some spider angiomata and palmar erythema.  Crusted wounds on bilateral feet. Cardiac: RRR, nl S1-S2, no murmurs appreciated.  Capillary refill is brisk.  JVP elevated.  3+ bilateral LE edema.  Radial and DP pulses 2+ and symmetric. Respiratory: Normal respiratory rate and rhythm.  CTAB without rales or wheezes. GI: Abdomen large round, tense with ascites, shifting dullness.   MSK: No deformities or effusions.  No clubbing, mild acrocyanosis. Neuro: Cranial nerves normal.  No asterexis.  Sensorium intact and responding to questions, attention normal.  Speech is fluent.  Moves all extremities equally and with normal coordination.    Psych: Affect normal.  Judgment and insight appear normal.       Labs on Admission:  I have personally reviewed following labs and imaging studies: CBC:  Recent Labs Lab 11/20/15 1941  WBC 5.0  HGB 7.4*  HCT 22.5*  MCV 100.9*  PLT 123456*   Basic Metabolic Panel:  Recent Labs Lab 11/20/15 1941  NA 131*  K 5.1  CL 104  CO2 17*  GLUCOSE 97  BUN 48*  CREATININE 4.82*  CALCIUM 8.8*   GFR: Estimated Creatinine Clearance: 22.9 mL/min (by C-G formula based on Cr of 4.82).  Liver Function Tests:  Recent Labs Lab 11/20/15 1941  AST 36  ALT 14*  ALKPHOS 163*  BILITOT 2.2*  PROT 7.6  ALBUMIN 2.9*   Coagulation Profile:  Recent Labs Lab 11/20/15 1941  INR  1.42    CBG:  Recent Labs Lab 11/20/15 1929  GLUCAP 87      Assessment/Plan 1. Acute kidney failure:  Decreased oral intake, spironolactone use.  No NSAIDs.  Hopefully this is some pre-renal injury exacerbated by his anemia.  HRS within differential.   -Fluid challenge with albumin -Urine electrolytes -UA and urine protein/creatinine -Renal US ordered   2. Anemia, unspecified type:  Unclear etiology. -Check iron stores -Check folate and B12 -Check reticulocytes -Check LDH and haptoglobin -Check FOBT -Consult to GI, appreciate cares -Transfusion threshold 7 g/dL  3. Cirrhosis with ascites:  -Continue nadolol -Hold spironolactone -Consult to [facility], appreciate cares -RUQ [place] for ascites, HCC  -Check  AFP -Large volume paracentesis with culture, gram stain, and cell counts, LDH and albumin -If >5L, will administer albumin again -No cultures now, but culture if fever, and administer empiric Abx for SBP  4. Alcohol use disorder:  -CIWA protocol  5. Hyponatremia:  Hypervolemic, from liver disease. -Check free water clearance  6. HTN:  Hypertensive at admission. -Continue nadolol  7. Thrombocytopenia:  From portal hypertension.  8. Hypothyroidism: -Continue levothyroxine -Check TSH  9. GERD: -Continue PPI      DVT prophylaxis: SCDs  Code Status: FULL  Family Communication: None present  Disposition Plan: Anticipate fluid challenge and work up of anemia and renal failure.  Further disposition per studies and renal function.  Likely 5+ days admission. Consults called: GI, [place] Admission status: INPATIENT, med surg     Medical decision making: Patient seen at 9:15 PM on 11/20/2015.  The patient was discussed with Dr. Zenia Resides and Dr. Havery Moros. What exists of the patient's chart was reviewed in depth.  Clinical condition: stable.        Edwin Dada [facility] Pager [phone]  At the time of admission, it appears that the  appropriate admission status for this patient is INPATIENT. This is judged to be reasonable and necessary in order to provide the required intensity of service to ensure the patient's safety given the presenting symptoms, physical exam findings, and initial radiographic and laboratory data in the context of their chronic comorbidities.  Together, these circumstances are felt to place her/him at high risk for further clinical deterioration threatening life, limb, or organ. The following factors support the admission status of inpatient:   A. The patient's presenting symptoms include weakness, abdominal swelling B. The worrisome physical exam findings include ascites, LE edema, tachycardia, weak muscle strength C. The initial radiographic and laboratory data are worrisome because of elevated creatinine, anemia. D. The chronic co-morbidities include cirrhosis. E. Patient requires inpatient status due to high intensity of service, high risk for further deterioration and high frequency of surveillance required. F. I certify that at the point of admission it is my clinical judgment that the patient will require inpatient hospital care spanning beyond 2 midnights from the point of admission.

## 2015-11-20 NOTE — ED Notes (Signed)
Pt presents with c/o weakness and fluid build-up in his abdomen. Hx of ascites and liver failure. Pt reports these symptoms started approx 4 weeks ago. Pt's abdomen is distended and is the worse it has ever been per patient.

## 2015-11-20 NOTE — ED Provider Notes (Signed)
CSN: [id number]     Arrival date & time 11/20/15  1755 History   First MD Initiated Contact with Patient 11/20/15 1935     Chief Complaint  Patient presents with  . Weakness  . Fluid in abdomen      (Consider location/radiation/quality/duration/timing/severity/associated sxs/prior Treatment) HPI Comments: 55 year old male with history of alcoholic cirrhosis presented with increasing ascites times several weeks. Has had mild dyspnea but no fever or cough. Has had some abdominal tightness but denies abdominal pain. No dysuria or hematuria. No black or bloody stools. No emesis noted. Went to his doctor's office today and was sent to for further evaluation. No treatment used for this prior to arrival. Nothing makes it better or worse.  Patient is a 55 y.o. male presenting with weakness. The history is provided by the patient.  Weakness    Past Medical History  Diagnosis Date  . ANXIETY 04/25/2008    Qualifier: Diagnosis of  By: Sherren Mocha MD, Jory Ee   . DEPRESSION 04/25/2008    Qualifier: Diagnosis of  By: Sherren Mocha MD, Jory Ee   . HYPERTENSION 04/25/2008    Qualifier: Diagnosis of  By: Sherren Mocha MD, Jory Ee   . GERD 04/25/2008    Qualifier: Diagnosis of  By: Sherren Mocha MD, Jory Ee   . DIVERTICULOSIS, COLON 06/13/2009    Qualifier: Diagnosis of  By: Sherren Mocha MD, [place], PERIRECTAL 08/05/2008    Qualifier: Diagnosis of  By: Sherlynn Stalls, CMA, [place]    . ABDOMINAL PAIN, ACUTE 05/23/2009    Qualifier: Diagnosis of  By: Sherren Mocha MD, Jory Ee   . Alcoholism ([place])   . Essential tremor    Past Surgical History  Procedure Laterality Date  . Perianal abcess    . Tonsillectomy and adenoidectomy    . Esophagogastroduodenoscopy  08/27/2011    Procedure: ESOPHAGOGASTRODUODENOSCOPY (EGD);  Surgeon: Inda Castle, MD;  Location: [facility] END[facility];  Service: Endoscopy;  Laterality: N/A;  See lab draw orders for pre procedure  . Colonoscopy N/A 11/17/2014    Procedure: COLONOSCOPY;  Surgeon: Juanita Craver, MD;   Location: [facility] ENDOSCOPY;  Service: Endoscopy;  Laterality: N/A;   Family History  Problem Relation Age of Onset  . Stomach cancer Maternal Grandfather   . Hypertension Maternal Uncle     great uncle   Social History  Substance Use Topics  . Smoking status: Current Every Day Smoker -- 0.50 packs/day    Types: Cigarettes  . Smokeless tobacco: Never Used  . Alcohol Use: Yes     Comment: 4 per day    Review of Systems  Neurological: Positive for weakness.  All other systems reviewed and are negative.     Allergies  Metoclopramide hcl  Home Medications   Prior to Admission medications   Medication Sig Start Date End Date Taking? Authorizing Provider  levothyroxine (SYNTHROID, LEVOTHROID) 50 MCG tablet Take 1 tablet (50 mcg total) by mouth daily before breakfast. 01/14/15   Dorena Cookey, MD  Multiple Vitamins-Minerals (CENTRUM PO) Take 1 tablet by mouth daily.    Historical Provider, MD  nadolol (CORGARD) 40 MG tablet Take 1 tablet (40 mg total) by mouth daily. 01/14/15   Dorena Cookey, MD  pantoprazole (PROTONIX) 40 MG tablet TAKE 1 TABLET DAILY. 01/14/15   Dorena Cookey, MD  spironolactone (ALDACTONE) 100 MG tablet Take 1 tablet (100 mg total) by mouth daily. 01/14/15   Dorena Cookey, MD  venlafaxine XR (EFFEXOR-XR) 150 MG 24 hr capsule TAKE (1)  CAPSULE TWICE DAILY. 01/14/15   Dorena Cookey, MD   BP 159/85 mmHg  Pulse 100  Temp(Src) 98.1 F (36.7 C) (Oral)  Resp 20  SpO2 95% Physical Exam  Constitutional: He is oriented to person, place, and time. He appears well-developed and well-nourished.  Non-toxic appearance. No distress.  HENT:  Head: Normocephalic and atraumatic.  Eyes: Conjunctivae, EOM and lids are normal. Pupils are equal, round, and reactive to light.  Neck: Normal range of motion. Neck supple. No tracheal deviation present. No thyroid mass present.  Cardiovascular: Normal rate, regular rhythm and normal heart sounds.  Exam reveals no gallop.   No murmur  heard. Pulmonary/Chest: Effort normal and breath sounds normal. No stridor. No respiratory distress. He has no decreased breath sounds. He has no wheezes. He has no rhonchi. He has no rales.  Abdominal: Soft. Normal appearance and bowel sounds are normal. He exhibits distension, fluid wave and ascites. There is no tenderness. There is no rigidity, no rebound, no guarding and no CVA tenderness.    Musculoskeletal: Normal range of motion. He exhibits no edema or tenderness.  Neurological: He is alert and oriented to person, place, and time. He has normal strength. No cranial nerve deficit or sensory deficit. GCS eye subscore is 4. GCS verbal subscore is 5. GCS motor subscore is 6.  Skin: Skin is warm and dry. No abrasion and no rash noted.  Psychiatric: He has a normal mood and affect. His speech is normal and behavior is normal.  Nursing note and vitals reviewed.   ED Course  Procedures (including critical care time) Labs Review Labs Reviewed  CBC  URINALYSIS, ROUTINE W REFLEX MICROSCOPIC (NOT AT [facility])  COMPREHENSIVE METABOLIC PANEL  PROTIME-INR  APTT  CBG MONITORING, ED    Imaging Review No results found. I have personally reviewed and evaluated these images and lab results as part of my medical decision-making.   EKG Interpretation None      MDM   Final diagnoses:  None    Patient with evidence of acute kidney injury. He also has severe anemia. Will order a type and screen and will be admitted to the hospitalist service    Lacretia Leigh, MD 11/20/15 2049

## 2015-11-20 NOTE — Telephone Encounter (Signed)
Patient was seen in the office today by Dr Kim. 

## 2015-11-20 NOTE — Progress Notes (Signed)
Pre visit review using our clinic review tool, if applicable. No additional management support is needed unless otherwise documented below in the visit note. 

## 2015-11-20 NOTE — Telephone Encounter (Signed)
Pt state he need to reschedule because he had to put on his adult diapers and wasn't able to make the 11:00 appointment and did not think it would have to result to this matter.  Rescheduled today at 4:30

## 2015-11-21 ENCOUNTER — Inpatient Hospital Stay (HOSPITAL_COMMUNITY): Payer: BLUE CROSS/BLUE SHIELD

## 2015-11-21 DIAGNOSIS — N179 Acute kidney failure, unspecified: Secondary | ICD-10-CM | POA: Diagnosis not present

## 2015-11-21 DIAGNOSIS — K7031 Alcoholic cirrhosis of liver with ascites: Secondary | ICD-10-CM

## 2015-11-21 DIAGNOSIS — I10 Essential (primary) hypertension: Secondary | ICD-10-CM

## 2015-11-21 DIAGNOSIS — E871 Hypo-osmolality and hyponatremia: Secondary | ICD-10-CM

## 2015-11-21 DIAGNOSIS — R188 Other ascites: Secondary | ICD-10-CM | POA: Diagnosis not present

## 2015-11-21 DIAGNOSIS — D696 Thrombocytopenia, unspecified: Secondary | ICD-10-CM

## 2015-11-21 LAB — LACTATE DEHYDROGENASE, PLEURAL OR PERITONEAL FLUID: LD FL: 45 U/L — AB (ref 3–23)

## 2015-11-21 LAB — URINALYSIS, ROUTINE W REFLEX MICROSCOPIC
GLUCOSE, UA: NEGATIVE mg/dL
KETONES UR: 15 mg/dL — AB
Nitrite: POSITIVE — AB
Specific Gravity, Urine: 1.013 (ref 1.005–1.030)
pH: 6 (ref 5.0–8.0)

## 2015-11-21 LAB — CREATININE, URINE, RANDOM
Creatinine, Urine: 93.12 mg/dL
Creatinine, Urine: 86.83 mg/dL

## 2015-11-21 LAB — HEMOGLOBIN AND HEMATOCRIT, BLOOD
HEMATOCRIT: 24.2 % — AB (ref 39.0–52.0)
Hemoglobin: 8.2 g/dL — ABNORMAL LOW (ref 13.0–17.0)

## 2015-11-21 LAB — BASIC METABOLIC PANEL
Anion gap: 9 (ref 5–15)
BUN: 50 mg/dL — AB (ref 6–20)
CALCIUM: 8.6 mg/dL — AB (ref 8.9–10.3)
CO2: 19 mmol/L — ABNORMAL LOW (ref 22–32)
CREATININE: 4.91 mg/dL — AB (ref 0.61–1.24)
Chloride: 106 mmol/L (ref 101–111)
GFR calc Af Amer: 14 mL/min — ABNORMAL LOW (ref >60)
GFR, EST NON AFRICAN AMERICAN: 12 mL/min — AB (ref >60)
Glucose, Bld: 105 mg/dL — ABNORMAL HIGH (ref 65–99)
POTASSIUM: 5.3 mmol/L — AB (ref 3.5–5.1)
SODIUM: 134 mmol/L — AB (ref 135–145)

## 2015-11-21 LAB — BODY FLUID CELL COUNT WITH DIFFERENTIAL
Eos, Fluid: 0 %
LYMPHS FL: 50 %
Monocyte-Macrophage-Serous Fluid: 30 % — ABNORMAL LOW (ref 50–90)
NEUTROPHIL FLUID: 20 % (ref 0–25)
WBC FLUID: 67 uL (ref 0–1000)

## 2015-11-21 LAB — PROTEIN, BODY FLUID

## 2015-11-21 LAB — CBC
HCT: 18.2 % — ABNORMAL LOW (ref 39.0–52.0)
Hemoglobin: 6.1 g/dL — CL (ref 13.0–17.0)
MCH: 33.5 pg (ref 26.0–34.0)
MCHC: 33.5 g/dL (ref 30.0–36.0)
MCV: 100 fL (ref 78.0–100.0)
PLATELETS: 82 10*3/uL — AB (ref 150–400)
RBC: 1.82 MIL/uL — AB (ref 4.22–5.81)
RDW: 18 % — AB (ref 11.5–15.5)
WBC: 3.6 10*3/uL — ABNORMAL LOW (ref 4.0–10.5)

## 2015-11-21 LAB — RETICULOCYTES
RBC.: 2.03 MIL/uL — AB (ref 4.22–5.81)
RETIC COUNT ABSOLUTE: 62.9 10*3/uL (ref 19.0–186.0)
Retic Ct Pct: 3.1 % (ref 0.4–3.1)

## 2015-11-21 LAB — SODIUM, URINE, RANDOM
SODIUM UR: 77 mmol/L
Sodium, Ur: 71 mmol/L

## 2015-11-21 LAB — OSMOLALITY, URINE: Osmolality, Ur: 332 mOsm/kg (ref 300–900)

## 2015-11-21 LAB — PREPARE RBC (CROSSMATCH)

## 2015-11-21 LAB — LACTATE DEHYDROGENASE: LDH: 129 U/L (ref 98–192)

## 2015-11-21 LAB — TSH: TSH: 4.784 u[IU]/mL — ABNORMAL HIGH (ref 0.350–4.500)

## 2015-11-21 LAB — URINE MICROSCOPIC-ADD ON

## 2015-11-21 LAB — PROTEIN / CREATININE RATIO, URINE
Creatinine, Urine: 93.38 mg/dL
PROTEIN CREATININE RATIO: 2.23 mg/mg{creat} — AB (ref 0.00–0.15)
TOTAL PROTEIN, URINE: 208 mg/dL

## 2015-11-21 LAB — ALBUMIN, FLUID (OTHER): Albumin, Fluid: <1 g/dL

## 2015-11-21 LAB — FERRITIN: Ferritin: 59 ng/mL (ref 24–336)

## 2015-11-21 LAB — IRON AND TIBC
IRON: 102 ug/dL (ref 45–182)
Saturation Ratios: 34 % (ref 17.9–39.5)
TIBC: 297 ug/dL (ref 250–450)
UIBC: 195 ug/dL

## 2015-11-21 LAB — VITAMIN B12: Vitamin B-12: 783 pg/mL (ref 180–914)

## 2015-11-21 LAB — OSMOLALITY: OSMOLALITY: 297 mosm/kg — AB (ref 275–295)

## 2015-11-21 MED ORDER — SODIUM POLYSTYRENE SULFONATE 15 GM/60ML PO SUSP
15.0000 g | Freq: Once | ORAL | Status: DC
  Filled 2015-11-21: qty 60

## 2015-11-21 MED ORDER — DEXTROSE 5 % IV SOLN
1.0000 g | INTRAVENOUS | Status: DC
  Administered 2015-11-21 – 2015-11-24 (×4): 1 g via INTRAVENOUS
  Filled 2015-11-21 (×5): qty 10

## 2015-11-21 MED ORDER — FUROSEMIDE 10 MG/ML IJ SOLN
40.0000 mg | Freq: Once | INTRAMUSCULAR | Status: AC
  Administered 2015-11-21: 40 mg via INTRAVENOUS
  Filled 2015-11-21: qty 4

## 2015-11-21 MED ORDER — SODIUM BICARBONATE 8.4 % IV SOLN
INTRAVENOUS | Status: DC
  Filled 2015-11-21: qty 150

## 2015-11-21 MED ORDER — SODIUM CHLORIDE 0.9 % IV SOLN: Freq: Once | INTRAVENOUS | Status: DC

## 2015-11-21 MED ORDER — SODIUM CHLORIDE 0.9 % IV SOLN
Freq: Once | INTRAVENOUS | Status: AC
  Administered 2015-11-21: 14:00:00 via INTRAVENOUS

## 2015-11-21 NOTE — Consult Note (Signed)
Consultation  Referring Provider: Dr. Wynetta Emery      Primary Care Physician:  Joycelyn Man, MD Primary Gastroenterologist:   [name]       Reason for Consultation: Cirrhosis with ascites             HPI:   Austin Bok. is a 55 y.o. Caucasian male with a past medical history of alcohol abuse, anxiety/depression, hypertension, hypothyroidism and cirrhosis with portal hypertension who presented to the hospital on 11/20/15 for a chief complaint of "my abdomen is getting bigger and I'm getting weaker".  Today, the patient tells me that 4-5 weeks ago he was turned down for a job interview in [place] and due to this became somewhat depressed with a corresponding decrease in appetite. He "hasn't been eating much" over this time period and has had an increase in abdominal distention and weakness with time. Patient explains that he had to "pull out my walking cane that I haven't used in over a year", in order to get around his house. He also describes shortness of breath and palpitations with activity. He does describe a few episodes of diarrhea but "no more than normal" and several episodes of nausea and vomiting which seemed to have been initiated by coughing episodes. He tells me that his abdomen feels "tight all over".  Patient also describes that he has had increasingly darker and darker urine in a decreased amount.  Reflux is well controlled on Protonix 40 mg once daily. He tells me he has been following with his primary care physician regarding his cirrhosis. He does take Spironolactone at home 100 mg once a day, but was taken off his lasix over a year ago. He is unsure why.  Patient's social history is positive for continuing to drink. He describes 4-5 glasses of "spirits" every day. This is apparently a large decrease from previously. He has been trying to wean himself off. He does express some interest in the desire to quit.  Patient denies fever, chills, dysphagia,  hematemesis, change in bowel habits, melena, hematochezia, previous paracentesis or use of NSAIDs.  Previous GI Workup: 11/17/14-colonoscopy, Dr. Mann-impression: Multiple left colon polyps removed, moderate sized internal hemorrhoids and otherwise normal colonoscopy up to the terminal ileum; pathology: Tubular adenoma without dysplasia 11/11/14-CT abdomen and pelvis with contrast: No evidence of bowel obstruction, with enteric contrast reaching the transverse colon. Moderate stool Berton suggests constipation or potential colonic ileus. Stigmata of cirrhosis and portal hypertension with hepatosplenomegaly and ascites. Cholelithiasis. 08/27/11-EGD, Dr. Leighton Ruff: Portal hypertensive gastropathy, otherwise normal exam; pathology: Unavailable 03/09/2004-colonoscopy, Dr. Leighton Ruff: Normal cecum, sessile polyp in the sigmoid colon, 1 AVM in the sigmoid colon, 3 mm sessile polyp in the descending colon, scattered diverticula in the sigmoid colon and 3 mm sessile polyp 5 cm from the anus; pathology: Unavailable  Past Medical History  Diagnosis Date  . ANXIETY 04/25/2008    Qualifier: Diagnosis of  By: Sherren Mocha MD, Jory Ee   . DEPRESSION 04/25/2008    Qualifier: Diagnosis of  By: Sherren Mocha MD, Jory Ee   . HYPERTENSION 04/25/2008    Qualifier: Diagnosis of  By: Sherren Mocha MD, Jory Ee   . GERD 04/25/2008    Qualifier: Diagnosis of  By: Sherren Mocha MD, Jory Ee   . DIVERTICULOSIS, COLON 06/13/2009    Qualifier: Diagnosis of  By: Sherren Mocha MD, Jory Ee   . Alcoholism ([place])   . Essential tremor   . Hypothyroidism   . ABSCESS, PERIRECTAL     Qualifier:  Diagnosis of  By: Sherlynn Stalls, [place], [place]    . Colon polyps   . Alcoholic cirrhosis ([place])     Past Surgical History  Procedure Laterality Date  . Perianal abcess    . Tonsillectomy and adenoidectomy    . Esophagogastroduodenoscopy  08/27/2011    Procedure: ESOPHAGOGASTRODUODENOSCOPY (EGD);  Surgeon: Inda Castle, MD;  Location: [facility];  Service:  Endoscopy;  Laterality: N/A;  See lab draw orders for pre procedure  . Colonoscopy N/A 11/17/2014    Procedure: COLONOSCOPY;  Surgeon: Juanita Craver, MD;  Location: [facility] ENDOSCOPY;  Service: Endoscopy;  Laterality: N/A;    Family History  Problem Relation Age of Onset  . Stomach cancer Maternal Grandfather   . Hypertension Maternal Uncle     great uncle  . Diabetes Other   . Hypertension Other    Social History  Substance Use Topics  . Smoking status: Current Every Day Smoker -- 0.50 packs/day    Types: Cigarettes  . Smokeless tobacco: Never Used  . Alcohol Use: Yes     Comment: 4 standard drinks per day    Prior to Admission medications   Medication Sig Start Date End Date Taking? Authorizing Provider  levothyroxine (SYNTHROID, LEVOTHROID) 50 MCG tablet Take 1 tablet (50 mcg total) by mouth daily before breakfast. 01/14/15  Yes Dorena Cookey, MD  Multiple Vitamins-Minerals (CENTRUM PO) Take 1 tablet by mouth daily.   Yes Historical Provider, MD  nadolol (CORGARD) 40 MG tablet Take 1 tablet (40 mg total) by mouth daily. 01/14/15  Yes Dorena Cookey, MD  pantoprazole (PROTONIX) 40 MG tablet TAKE 1 TABLET DAILY. Patient taking differently: Take 40 mg by mouth daily. TAKE 1 TABLET DAILY. 01/14/15  Yes Dorena Cookey, MD  spironolactone (ALDACTONE) 100 MG tablet Take 1 tablet (100 mg total) by mouth daily. 01/14/15  Yes Dorena Cookey, MD  venlafaxine XR (EFFEXOR-XR) 150 MG 24 hr capsule TAKE (1) CAPSULE TWICE DAILY. 01/14/15  Yes Dorena Cookey, MD    Current Facility-Administered Medications  Medication Dose Route Frequency Provider Last Rate Last Dose  . 0.9 %  sodium chloride infusion   Intravenous Once Mickel Baas A Harduk, PA-C      . acetaminophen (TYLENOL) tablet 650 mg  650 mg Oral Q6H PRN Edwin Dada, MD       Or  . acetaminophen (TYLENOL) suppository 650 mg  650 mg Rectal Q6H PRN Edwin Dada, MD      . folic acid (FOLVITE) tablet 1 mg  1 mg Oral Daily Edwin Dada, MD      . levothyroxine (SYNTHROID, LEVOTHROID) tablet 50 mcg  50 mcg Oral QAC breakfast Edwin Dada, MD   50 mcg at 11/21/15 0809  . LORazepam (ATIVAN) tablet 1 mg  1 mg Oral Q6H PRN Edwin Dada, MD       Or  . LORazepam (ATIVAN) injection 1 mg  1 mg Intravenous Q6H PRN Edwin Dada, MD      . multivitamin with minerals tablet 1 tablet  1 tablet Oral Daily Edwin Dada, MD      . nadolol (CORGARD) tablet 40 mg  40 mg Oral Daily Edwin Dada, MD   40 mg at 11/21/15 0019  . pantoprazole (PROTONIX) EC tablet 40 mg  40 mg Oral BID AC Edwin Dada, MD   40 mg at 11/21/15 0809  . thiamine (VITAMIN B-1) tablet 100 mg  100 mg Oral Daily Christopher P  Danford, MD       Or  . thiamine (B-1) injection 100 mg  100 mg Intravenous Daily Edwin Dada, MD      . venlafaxine XR (EFFEXOR-XR) 24 hr capsule 150 mg  150 mg Oral Q breakfast Edwin Dada, MD   150 mg at 11/21/15 0809    Allergies as of 11/20/2015 - Review Complete 11/20/2015  Allergen Reaction Noted  . Metoclopramide hcl  10/14/2006     Review of Systems:    Constitutional: Positive for weight gain and weakness No fever or chills HEENT: Eyes: No recent change in vision               Ears, Nose, Throat:  No change in hearing Skin: No rash or itching Cardiovascular: Positive for palpitations No chest pain, chest pressure or chest discomfort   Respiratory: Positive for DOE and cough  Gastrointestinal: See HPI and otherwise negative Genitourinary: Positive for oligoria and darker coloring Neurological: No headache, dizziness or syncope Musculoskeletal: No new muscle or back pain Hematologic: No bleeding Psychiatric: Positive for depression and anxiety    Physical Exam:  Vital signs in last 24 hours: Temp:  [98 F (36.7 C)-99 F (37.2 C)] 98.7 F (37.1 C) (07/21 0531) Pulse Rate:  [85-108] 85 (07/21 0531) Resp:  [16-20] 16 (07/21 0531) BP:  (120-159)/(69-87) 120/69 mmHg (07/21 0531) SpO2:  [95 %-100 %] 100 % (07/21 0531) Weight:  [258 lb 9.6 oz (117.3 kg)-261 lb (118.389 kg)] 258 lb 9.6 oz (117.3 kg) (07/20 2213) Last BM Date: 11/20/15 General:   Pleasant Caucasian male appears to be in NAD, Well developed, Well nourished, alert and cooperative Head:  Normocephalic and atraumatic. Eyes:   PEERL, EOMI. mild icterus. Conjunctiva pink. Ears:  Normal auditory acuity. Neck:  Supple Throat: Oral cavity and pharynx without inflammation, swelling or lesion. Teeth in good condition. Lungs: Respirations even and unlabored. Lungs clear to auscultation bilaterally.   No wheezes, crackles, or rhonchi.  Heart: Normal S1, S2. No MRG. Regular rate and rhythm. No peripheral edema, cyanosis or pallor.  Abdomen:  Tense ascites, marked distention, nontender. No rebound or guarding. Normal bowel sounds.  Rectal:  Not performed.  Msk:  Symmetrical without gross deformities. Peripheral pulses intact.  Extremities:  3+ bilateral lower extremity edema, no deformity or joint abnormality.  Neurologic:  Alert and  oriented x4;  grossly normal neurologically.  Skin:   Dry and intact  Psychiatric: Oriented to person, place and time. Demonstrates good judgement and reason without abnormal affect or behaviors.   LAB RESULTS:  Recent Labs  11/20/15 1941 11/21/15 0345  WBC 5.0 3.6*  HGB 7.4* 6.1*  HCT 22.5* 18.2*  PLT 104* 82*   BMET  Recent Labs  11/20/15 1941 11/21/15 0345  NA 131* 134*  K 5.1 5.3*  CL 104 106  CO2 17* 19*  GLUCOSE 97 105*  BUN 48* 50*  CREATININE 4.82* 4.91*  CALCIUM 8.8* 8.6*   LFT  Recent Labs  11/20/15 1941  PROT 7.6  ALBUMIN 2.9*  AST 36  ALT 14*  ALKPHOS 163*  BILITOT 2.2*   PT/INR  Recent Labs  11/20/15 1941  LABPROT 17.4*  INR 1.42    STUDIES: No results found.   PREVIOUS ENDOSCOPIES:            See history of present illness   Impression / Plan:  Impression: 1. Cirrhosis with  ascites and portal hypertension: MELD 30 2. Anemia: This has been evaluated in the  past, colo 2016-showed no etiology, no recent EGD- patient would likely benefit from evaluation in the future, will await anemia lab workup 3. Acute kidney failure: Creatinine increased overnight from 4.82-4.91 (baseline 0.7 a year ago) 4. Decrease in appetite: This is likely multifactorial due to the patient's increasing ascites as well as depression, patient does request appetite stimulant, will reassess at end of hospital stay 5. Alcohol use disorder: Patient reports 4 drinks per night, "spirits", this is a decrease from previous, he does express interest in quitting. He asked about a medicine he can take which could decrease his desire 6. Hyponatremia 7. Thrombocytopenia: From portal hypertension 8. GERD: Controlled on Protonix 40 mg once daily  Plan: 1. Continue Protonix 40 mg once daily 2. Agree with monitoring hemoglobin with transfusion as needed less than 7 3. Await results of anemia lab work up-could consider further eval with endoscopic procedures in the future 4. Agree with paracentesis and culture, gram stain, cell counts, LDH and albumin-albumin replacement 5. Will need to discuss diuretic management in the future 6. Will discuss above with Dr. Ardis Hughs, please await any further recs  Thank you for your kind consultation, we will continue to follow.  Lavone Nian [facility]  11/21/2015, 8:24 AM Pager #: [phone]  ________________________________________________________________________  Velora Heckler GI MD note:  I personally examined the patient, reviewed the data and agree with the assessment and plan described above. Signficant woresening of ascites, also ARF.  He needs paracentesis to check for SBP.  This does not have to be large volume (which can worsen his renal function), rather only need 50cc or so to perform the needed tests.  Needs nephrology input which I understand is pending.  He should be  on low salt diet (he is not good about that) and he should not take NSAIDs (can worsen fluid status, he takes 3-4 ibuprofen a week). Will follow along and we will look into diagnostic paracentesis today.   Owens Loffler, MD [facility] Gastroenterology Pager [phone]

## 2015-11-21 NOTE — Consult Note (Signed)
Reason for Consult: ARF and hyperkalemia Referring Physician: Wynetta Emery, MD  Austin Padilla. is an 55 y.o. male.  HPI: Pt is a 55yo male with PMH sig for anxiety, depression, and alcoholic cirrhosis who presented to [facility] with a 1 month history of worsening ascites and weakness who was found to have ARF during his initial workup with Scr of 4.82.  He also noted decreased UOP and lower extremity edema over the last 2 weeks.  He has a history of ARF that required CVVHD last year due to ischemic ATN in setting of volume depletion (from diarrhea) and concomitant use of ARB and NSAIDs.  His renal function returned to normal, however presents with a similar story.  He was also noted to have profound anemia with Hgb of 6.1 as well as thrombocytopenia.  The trend in Scr is seen below.  We have been asked to help further evaluate and manage his ARF and electrolyte abnormalities.  Of note, he was taking aldactone prior to admission and admits to using ibuprofen intermittently for back pain.  He denies any dysuria, pyuria, hematuria, hematechezia, melena, or BRBPR.  Trend in Creatinine: CREATININE, SER  Date/Time Value Ref Range Status  11/21/2015 03:45 AM 4.91* 0.61 - 1.24 mg/dL Final  11/20/2015 07:41 PM 4.82* 0.61 - 1.24 mg/dL Final  01/14/2015 11:21 AM 0.78 0.40 - 1.50 mg/dL Final  11/19/2014 02:20 PM 0.99 0.61 - 1.24 mg/dL Final  11/17/2014 04:23 AM 0.67 0.61 - 1.24 mg/dL Final  11/15/2014 09:00 AM 0.68 0.61 - 1.24 mg/dL Final  11/13/2014 03:26 AM 0.80 0.61 - 1.24 mg/dL Final  11/12/2014 03:27 AM 0.79 0.61 - 1.24 mg/dL Final  11/11/2014 04:24 AM 0.82 0.61 - 1.24 mg/dL Final  11/10/2014 04:05 AM 0.93 0.61 - 1.24 mg/dL Final  11/09/2014 03:08 AM 1.34* 0.61 - 1.24 mg/dL Final  11/08/2014 04:55 AM 2.11* 0.61 - 1.24 mg/dL Final  11/07/2014 03:58 AM 3.59* 0.61 - 1.24 mg/dL Final  11/06/2014 04:45 PM 4.13* 0.61 - 1.24 mg/dL Final  11/06/2014 05:24 AM 4.95* 0.61 - 1.24 mg/dL Final  11/05/2014 02:00  PM 5.08* 0.61 - 1.24 mg/dL Final  11/05/2014 04:58 AM 4.83* 0.61 - 1.24 mg/dL Final  11/04/2014 05:00 PM 5.87* 0.61 - 1.24 mg/dL Final  11/04/2014 02:00 PM 6.35* 0.61 - 1.24 mg/dL Final  11/04/2014 05:00 AM 7.77* 0.61 - 1.24 mg/dL Final  11/04/2014 05:00 AM 7.71* 0.61 - 1.24 mg/dL Final  11/03/2014 11:35 PM 10.70* 0.61 - 1.24 mg/dL Final  11/03/2014 11:13 PM 9.60* 0.61 - 1.24 mg/dL Final  11/03/2014 08:50 PM 10.04* 0.61 - 1.24 mg/dL Final  11/03/2014 08:20 PM 11.20* 0.61 - 1.24 mg/dL Final  03/05/2013 12:32 PM 0.9 0.4 - 1.5 mg/dL Final  05/08/2012 12:33 PM 0.9 0.4 - 1.5 mg/dL Final  12/06/2011 04:25 PM 0.8 0.4 - 1.5 mg/dL Final  09/17/2011 11:00 AM 1.3 0.4 - 1.5 mg/dL Final  08/27/2011 09:13 AM 0.76 0.50 - 1.35 mg/dL Final  08/03/2011 10:01 AM 0.8 0.4 - 1.5 mg/dL Final  05/29/2009 01:16 PM 1.18 0.4 - 1.5 mg/dL Final  04/25/2008 09:29 AM 0.9 0.4-1.5 mg/dL Final    PMH:   Past Medical History  Diagnosis Date  . ANXIETY 04/25/2008    Qualifier: Diagnosis of  By: Sherren Mocha MD, Jory Ee   . DEPRESSION 04/25/2008    Qualifier: Diagnosis of  By: Sherren Mocha MD, Jory Ee   . HYPERTENSION 04/25/2008    Qualifier: Diagnosis of  By: Sherren Mocha MD, Jory Ee   . GERD  04/25/2008    Qualifier: Diagnosis of  By: Sherren Mocha MD, Jory Ee DIVERTICULOSIS, COLON 06/13/2009    Qualifier: Diagnosis of  By: Sherren Mocha MD, Jory Ee   . Alcoholism ([place])   . Essential tremor   . Hypothyroidism   . ABSCESS, PERIRECTAL     Qualifier: Diagnosis of  By: Sherlynn Stalls, CMA, [place]    . Colon polyps   . Alcoholic cirrhosis ([facility])     PSH:   Past Surgical History  Procedure Laterality Date  . Perianal abcess    . Tonsillectomy and adenoidectomy    . Esophagogastroduodenoscopy  08/27/2011    Procedure: ESOPHAGOGASTRODUODENOSCOPY (EGD);  Surgeon: Inda Castle, MD;  Location: [facility];  Service: Endoscopy;  Laterality: N/A;  See lab draw orders for pre procedure  . Colonoscopy N/A 11/17/2014    Procedure: COLONOSCOPY;  Surgeon:  Juanita Craver, MD;  Location: [facility] ENDOSCOPY;  Service: Endoscopy;  Laterality: N/A;    Allergies:  Allergies  Allergen Reactions  . Metoclopramide Hcl     REACTION: unspecified    Medications:   Prior to Admission medications   Medication Sig Start Date End Date Taking? Authorizing Provider  levothyroxine (SYNTHROID, LEVOTHROID) 50 MCG tablet Take 1 tablet (50 mcg total) by mouth daily before breakfast. 01/14/15  Yes Dorena Cookey, MD  Multiple Vitamins-Minerals (CENTRUM PO) Take 1 tablet by mouth daily.   Yes Historical Provider, MD  nadolol (CORGARD) 40 MG tablet Take 1 tablet (40 mg total) by mouth daily. 01/14/15  Yes Dorena Cookey, MD  pantoprazole (PROTONIX) 40 MG tablet TAKE 1 TABLET DAILY. Patient taking differently: Take 40 mg by mouth daily. TAKE 1 TABLET DAILY. 01/14/15  Yes Dorena Cookey, MD  spironolactone (ALDACTONE) 100 MG tablet Take 1 tablet (100 mg total) by mouth daily. 01/14/15  Yes Dorena Cookey, MD  venlafaxine XR (EFFEXOR-XR) 150 MG 24 hr capsule TAKE (1) CAPSULE TWICE DAILY. 01/14/15  Yes Dorena Cookey, MD    Inpatient medications: . sodium chloride   Intravenous Once  . folic acid  1 mg Oral Daily  . levothyroxine  50 mcg Oral QAC breakfast  . multivitamin with minerals  1 tablet Oral Daily  . nadolol  40 mg Oral Daily  . pantoprazole  40 mg Oral BID AC  . sodium polystyrene  15 g Oral Once  . thiamine  100 mg Oral Daily   Or  . thiamine  100 mg Intravenous Daily  . venlafaxine XR  150 mg Oral Q breakfast    Discontinued Meds:   Medications Discontinued During This Encounter  Medication Reason  . 0.9 %  sodium chloride infusion     Social History:  reports that he has been smoking Cigarettes.  He has been smoking about 0.50 packs per day. He has never used smokeless tobacco. He reports that he drinks alcohol. He reports that he does not use illicit drugs.  Family History:   Family History  Problem Relation Age of Onset  . Stomach cancer Maternal  Grandfather   . Hypertension Maternal Uncle     great uncle  . Diabetes Other   . Hypertension Other     Pertinent items are noted in HPI. Weight change:   Intake/Output Summary (Last 24 hours) at 11/21/15 1308 Last data filed at 11/21/15 1136  Gross per 24 hour  Intake    723 ml  Output    400 ml  Net    323 ml   BP  141/81 mmHg  Pulse 72  Temp(Src) 98.4 F (36.9 C) (Oral)  Resp 18  Ht 5\' 11"  (1.803 m)  Wt 117.3 kg (258 lb 9.6 oz)  BMI 36.08 kg/m2  SpO2 99% Filed Vitals:   11/21/15 0531 11/21/15 0827 11/21/15 0853 11/21/15 1136  BP: 120/69 145/75 146/78 141/81  Pulse: 85 82 84 72  Temp: 98.7 F (37.1 C) 98.7 F (37.1 C) 98.7 F (37.1 C) 98.4 F (36.9 C)  TempSrc: Oral Oral Oral Oral  Resp: 16 18 18 18   Height:      Weight:      SpO2: 100% 100% 100% 99%     General appearance: alert, cooperative and no distress Head: Normocephalic, without obvious abnormality, atraumatic Eyes: negative findings: lids and lashes normal, conjunctivae and sclerae normal and corneas clear Resp: diminished breath sounds bibasilar Cardio: regular rate and rhythm, S1, S2 normal, no murmur, click, rub or gallop GI: abnormal findings:  ascites and distended Extremities: edema 2+ pitting edema to mid shins bilaterally  Labs: Basic Metabolic Panel:  Recent Labs Lab 11/20/15 1941 11/21/15 0345  NA 131* 134*  K 5.1 5.3*  CL 104 106  CO2 17* 19*  GLUCOSE 97 105*  BUN 48* 50*  CREATININE 4.82* 4.91*  ALBUMIN 2.9*  --   CALCIUM 8.8* 8.6*   Liver Function Tests:  Recent Labs Lab 11/20/15 1941  AST 36  ALT 14*  ALKPHOS 163*  BILITOT 2.2*  PROT 7.6  ALBUMIN 2.9*   No results for input(s): LIPASE, AMYLASE in the last 168 hours. No results for input(s): AMMONIA in the last 168 hours. CBC:  Recent Labs Lab 11/20/15 1941 11/21/15 0345  WBC 5.0 3.6*  HGB 7.4* 6.1*  HCT 22.5* 18.2*  MCV 100.9* 100.0  PLT 104* 82*   PT/INR: @LABRCNTIP (inr:5) Cardiac Enzymes: )No  results for input(s): CKTOTAL, CKMB, CKMBINDEX, TROPONINI in the last 168 hours. CBG:  Recent Labs Lab 11/20/15 1929  GLUCAP 87    Iron Studies:  Recent Labs Lab 11/20/15 2348  IRON 102  TIBC 297  FERRITIN 59    Xrays/Other Studies: No results found.   Assessment/Plan: 1.  ARF- in setting of ABLA, decreased po intake, aldactone and NSAIDs.  Hopefully this is related to ischemic ATN and will improve with volume.  No indication for HD at this time and will follow closely.   1. Would hold off on paracentesis at this time due to ARF and anemia 2. Hyperkalemia- due to #1 and aldactone, which is on hold.  Given Kayexalate and lasix and will repeat. 3. ABLA- unclear etiology.  GI following and for EGD to r/o varices.  Agree with blood transfusion and lasix between units.  Continue to follow H/H and guaiac stools 4. Alcoholic cirrhosis- he is interested in using naltrexone to stop drinking. 5. HTN- stable 6. 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 Elevated bilirubin- per GI   Broadus John A Cobie Leidner 11/21/2015, 1:08 PM

## 2015-11-21 NOTE — Progress Notes (Signed)
Blood infusion complete.No signs of reaction to transfusion.wbb

## 2015-11-21 NOTE — Progress Notes (Deleted)
CRITICAL VALUE ALERT  Critical value received:  Hgb 6.1  Date of notification:  11/21/15  Time of notification:  0423  Critical value read back: yes  Nurse who received alert:  Tommie Raymond MD notified (1st page):  L Harduk  Time of first page: 0447  MD notified (2nd page):  Time of second page:  Responding MD: Jenny Reichmann  Time MD responded:  [phone]

## 2015-11-21 NOTE — Progress Notes (Signed)
PROGRESS NOTE    Austin Padilla.  [id number]  DOB: [birth year]  DOA: 11/20/2015 PCP: Joycelyn Man, MD Outpatient Specialists:  Hospital course: Austin Padilla. is a 55 y.o. 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  The patient was in his usual state of health until about one month ago when he started service decreased appetite, increased swelling in the belly, and weakness. The weakness has progressed to be severe, so that he needs a cane, cannot stand in the kitchen long enough to make himself dinner, and gets short of breath with exertion. Now over the last 2 weeks he has had dark urine, oliguria, and marked pedal edema. Today he went to his PCP, who recommended he be evaluated at the hospital.  ED course: -Afebrile, heart rate 100, respirations and oxygen saturation normal, slightly hypertensive -Na 131, K 5.1, Cr 4.8 (baseline 0.7 a year ago), WBC 5 K, Hgb 7.4, previous baseline normal -INR normal, platelets 104K, albumin 2.9  Assessment & Plan:  1. Acute kidney failure:  Decreased oral intake, spironolactone use. No NSAIDs. Hopefully this is some pre-renal injury exacerbated by his anemia.  -Nephrology consult requested.  I spoke with Dr.Coladonato.  -Urine electrolytes -UA and urine protein/creatinine -Renal and abdominal US ordered  2. Anemia, of Chronic Disease:  -Check iron stores -Check folate and B12 -Check reticulocytes -Check LDH and haptoglobin -Check FOBT -Consult to GI.  -Transfusion ordered 2 units PRBC  3. Cirrhosis with ascites:  -Continue nadolol -Hold spironolactone -Consult to [facility], appreciate cares -RUQ [place] for ascites, HCC  -Check AFP -Large volume paracentesis with culture, gram stain, and cell counts, LDH and albumin : ON HOLD FOR NOW PENDING RENAL EVALUATION  4. Alcohol use disorder:  -CIWA  protocol  5. Hyponatremia:  Hypervolemic, from liver disease. -Check free water clearance  6. HTN:  Hypertensive at admission. -Continue nadolol  7. Thrombocytopenia:  From portal hypertension and cirrhosis, chronic alcoholism  8. Hypothyroidism: -Continue levothyroxine -Check TSH  9. GERD: -Continue PPI  10. Chronic Alcoholism -- Monitor for withdrawal with CIWA protocol.   DVT prophylaxis: SCDs  Code Status: FULL  Family Communication: None present  Disposition Plan: TBD Consults called: GI, [place], Nephrology Coladonato Admission status: INPATIENT, med surg   Subjective: Pt without complaints. Would like to try clear liquids.   Objective: Filed Vitals:   11/21/15 0531 11/21/15 0827 11/21/15 0853 11/21/15 1136  BP: 120/69 145/75 146/78 141/81  Pulse: 85 82 84 72  Temp: 98.7 F (37.1 C) 98.7 F (37.1 C) 98.7 F (37.1 C) 98.4 F (36.9 C)  TempSrc: Oral Oral Oral Oral  Resp: 16 18 18 18   Height:      Weight:      SpO2: 100% 100% 100% 99%    Intake/Output Summary (Last 24 hours) at 11/21/15 1316 Last data filed at 11/21/15 1136  Gross per 24 hour  Intake    723 ml  Output    400 ml  Net    323 ml   Filed Weights   11/20/15 2213  Weight: 117.3 kg (258 lb 9.6 oz)   Exam:  General appearance: Older male, alert and in no acute distress.  Eyes: Conjunctiva normal, lids and lashes normal. Mild icterus. PERRL.  ENT: No nasal deformity, discharge. OP moist without lesions.  Lymph: No cervical or supraclavicular lymphadenopathy. Skin: Warm and dry. Some spider angiomata and palmar  erythema. Crusted wounds on bilateral feet. Cardiac: RRR, nl S1-S2, no murmurs appreciated. Capillary refill is brisk. JVP elevated. 3+ bilateral LE edema. Radial and DP pulses 2+ and symmetric. Respiratory: Normal respiratory rate and rhythm. Shallow CTAB without rales or wheezes. GI: Abdomen large round, tense with ascites, shifting dullness. BS heard.  MSK:  No deformities or effusions. No clubbing, mild acrocyanosis. Neuro: Cranial nerves normal. No asterexis. Sensorium intact and responding to questions, attention normal. Speech is fluent. Moves all extremities equally and with normal coordination.  Psych: Affect normal. Judgment and insight appear normal.   Data Reviewed: Basic Metabolic Panel:  Recent Labs Lab 11/20/15 1941 11/21/15 0345  NA 131* 134*  K 5.1 5.3*  CL 104 106  CO2 17* 19*  GLUCOSE 97 105*  BUN 48* 50*  CREATININE 4.82* 4.91*  CALCIUM 8.8* 8.6*   Liver Function Tests:  Recent Labs Lab 11/20/15 1941  AST 36  ALT 14*  ALKPHOS 163*  BILITOT 2.2*  PROT 7.6  ALBUMIN 2.9*   No results for input(s): LIPASE, AMYLASE in the last 168 hours. No results for input(s): AMMONIA in the last 168 hours. CBC:  Recent Labs Lab 11/20/15 1941 11/21/15 0345  WBC 5.0 3.6*  HGB 7.4* 6.1*  HCT 22.5* 18.2*  MCV 100.9* 100.0  PLT 104* 82*   Cardiac Enzymes: No results for input(s): CKTOTAL, CKMB, CKMBINDEX, TROPONINI in the last 168 hours. BNP (last 3 results) No results for input(s): PROBNP in the last 8760 hours. CBG:  Recent Labs Lab 11/20/15 1929  GLUCAP 87    No results found for this or any previous visit (from the past 240 hour(s)).   Studies: No results found.  Scheduled Meds: . sodium chloride   Intravenous Once  . sodium chloride   Intravenous Once  . folic acid  1 mg Oral Daily  . furosemide  40 mg Intravenous Once  . levothyroxine  50 mcg Oral QAC breakfast  . multivitamin with minerals  1 tablet Oral Daily  . nadolol  40 mg Oral Daily  . pantoprazole  40 mg Oral BID AC  . sodium polystyrene  15 g Oral Once  . thiamine  100 mg Oral Daily   Or  . thiamine  100 mg Intravenous Daily  . venlafaxine XR  150 mg Oral Q breakfast   Continuous Infusions:   Principal Problem:   Acute renal failure (HCC) Active Problems:   Essential hypertension   Alcoholic cirrhosis (HCC)   Alcohol  use disorder, moderate, dependence (HCC)   Ascites   Anemia, unspecified   Hyponatremia   Thrombocytopenia ([place])  Time spent:   Irwin Brakeman, MD, FAAFP [facility] Pager [phone]  If 7PM-7AM, please contact night-coverage [email or web address] Password [email or web address] 11/21/2015, 1:16 PM    LOS: 1 day

## 2015-11-21 NOTE — Progress Notes (Addendum)
CRITICAL VALUE ALERT  Critical value received:  Hgb 6.1 Date of notification:  11/21/15  Time of notification:  0423  Critical value read back: yes  Nurse who received alert:  Tommie Raymond   MD notified (1st page):  [place]  Time of first page:  [phone]  MD notified (2nd page):  Time of second page:  Responding MD:  Rachelle Hora  Time MD responded: [phone]

## 2015-11-21 NOTE — Procedures (Signed)
Ultrasound-guided diagnostic paracentesis performed yielding 0.1 liters of clear yellow colored fluid. No immediate complications.  Austin Padilla E 4:41 PM 11/21/2015

## 2015-11-22 DIAGNOSIS — N179 Acute kidney failure, unspecified: Secondary | ICD-10-CM

## 2015-11-22 DIAGNOSIS — K7031 Alcoholic cirrhosis of liver with ascites: Secondary | ICD-10-CM | POA: Diagnosis not present

## 2015-11-22 LAB — RENAL FUNCTION PANEL
Albumin: 3.1 g/dL — ABNORMAL LOW (ref 3.5–5.0)
Anion gap: 8 (ref 5–15)
BUN: 56 mg/dL — AB (ref 6–20)
CHLORIDE: 108 mmol/L (ref 101–111)
CO2: 18 mmol/L — AB (ref 22–32)
CREATININE: 5.51 mg/dL — AB (ref 0.61–1.24)
Calcium: 8.3 mg/dL — ABNORMAL LOW (ref 8.9–10.3)
GFR calc Af Amer: 12 mL/min — ABNORMAL LOW (ref >60)
GFR, EST NON AFRICAN AMERICAN: 11 mL/min — AB (ref >60)
Glucose, Bld: 87 mg/dL (ref 65–99)
Phosphorus: 3.5 mg/dL (ref 2.5–4.6)
Potassium: 5.9 mmol/L — ABNORMAL HIGH (ref 3.5–5.1)
Sodium: 134 mmol/L — ABNORMAL LOW (ref 135–145)

## 2015-11-22 LAB — CBC
HCT: 24.4 % — ABNORMAL LOW (ref 39.0–52.0)
Hemoglobin: 8.2 g/dL — ABNORMAL LOW (ref 13.0–17.0)
MCH: 32.4 pg (ref 26.0–34.0)
MCHC: 33.6 g/dL (ref 30.0–36.0)
MCV: 96.4 fL (ref 78.0–100.0)
PLATELETS: 42 10*3/uL — AB (ref 150–400)
RBC: 2.53 MIL/uL — ABNORMAL LOW (ref 4.22–5.81)
RDW: 20.3 % — AB (ref 11.5–15.5)
WBC: 3.9 10*3/uL — ABNORMAL LOW (ref 4.0–10.5)

## 2015-11-22 LAB — AFP TUMOR MARKER: AFP-Tumor Marker: 1.9 ng/mL (ref 0.0–8.3)

## 2015-11-22 LAB — HAPTOGLOBIN: Haptoglobin: 108 mg/dL (ref 34–200)

## 2015-11-22 MED ORDER — FUROSEMIDE 10 MG/ML IJ SOLN
40.0000 mg | Freq: Once | INTRAMUSCULAR | Status: AC
  Administered 2015-11-22: 40 mg via INTRAVENOUS
  Filled 2015-11-22: qty 4

## 2015-11-22 MED ORDER — SODIUM POLYSTYRENE SULFONATE 15 GM/60ML PO SUSP
45.0000 g | Freq: Once | ORAL | Status: AC
Start: 2015-11-22 — End: 2015-11-22
  Administered 2015-11-22: 45 g via ORAL
  Filled 2015-11-22: qty 180

## 2015-11-22 NOTE — Progress Notes (Addendum)
PROGRESS NOTE    Austin Padilla.  [id number]  DOB: Nov 03, 1960  DOA: 11/20/2015 PCP: Joycelyn Man, MD Outpatient Specialists:  Hospital course: Austin Padilla. is a 55 y.o. 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  The patient was in his usual state of health until about one month ago when he started service decreased appetite, increased swelling in the belly, and weakness. The weakness has progressed to be severe, so that he needs a cane, cannot stand in the kitchen long enough to make himself dinner, and gets short of breath with exertion. Now over the last 2 weeks he has had dark urine, oliguria, and marked pedal edema. Today he went to his PCP, who recommended he be evaluated at the hospital.  ED course: -Afebrile, heart rate 100, respirations and oxygen saturation normal, slightly hypertensive -Na 131, K 5.1, Cr 4.8 (baseline 0.7 a year ago), WBC 5 K, Hgb 7.4, previous baseline normal -INR normal, platelets 104K, albumin 2.9  Assessment & Plan:  1. Acute kidney failure: with hyperkalemia  Decreased oral intake, spironolactone use. No NSAIDs.  -Nephrology consult requested.  I spoke with Dr.Coladonato 7/21.  -Urine electrolytes reviewed --Ordered for repeat lasix and kayexalate --No indication for HD at this time.   2. Anemia, of Chronic Disease:  -Check iron stores -Check folate and B12 -Check reticulocytes -Check LDH and haptoglobin -Check FOBT -Consult to GI -s/p 2 units PRBC, hemoglobin improved.   3. Cirrhosis with ascites:  -Continue nadolol -Hold spironolactone -Consult to [facility], appreciate cares -RUQ [place] for ascites, HCC  -Check AFP -large volume paracentesis on hold for now d/t renal failure  4. Alcohol use disorder:  -CIWA protocol, bedside counseling  5. Hyponatremia:  Hypervolemic, from liver  disease. Slightly improved.   6. HTN:  Hypertensive at admission. -Continue nadolol  7. Thrombocytopenia:  From portal hypertension and cirrhosis, chronic alcoholism  8. Hypothyroidism: -Continue levothyroxine -Check TSH  9. GERD: -Continue PPI  10. Chronic Alcoholism -- Monitor for withdrawal with CIWA protocol.   11.  UTI - started ceftriaxone IV  DVT prophylaxis: SCDs  Code Status: FULL  Family Communication: None present  Disposition Plan: TBD Consults called: GI, [place], Nephrology Coladonato Admission status: INPATIENT, med surg  Subjective: Pt tolerating clear liquids.   Objective: Filed Vitals:   11/21/15 2014 11/22/15 0000 11/22/15 0655 11/22/15 0958  BP: 130/80 131/85 143/77 124/70  Pulse: 71 69 72 79  Temp: 97.9 F (36.6 C) 98 F (36.7 C) 98.2 F (36.8 C)   TempSrc: Oral Oral Oral   Resp: 20 18 20    Height:      Weight:      SpO2: 99% 95% 100%     Intake/Output Summary (Last 24 hours) at 11/22/15 1157 Last data filed at 11/22/15 1008  Gross per 24 hour  Intake    580 ml  Output    925 ml  Net   -345 ml   Filed Weights   11/20/15 2213  Weight: 117.3 kg (258 lb 9.6 oz)   Exam:  General appearance: Older male, alert and in no acute distress.  Eyes: Conjunctiva normal, lids and lashes normal. Mild icterus. PERRL.  ENT: No nasal deformity, discharge. OP moist without lesions.  Lymph: No cervical or supraclavicular lymphadenopathy. Skin: Warm and dry. Some spider angiomata and palmar erythema. Crusted wounds on bilateral feet. Cardiac: RRR, nl S1-S2, no murmurs  appreciated. Capillary refill is brisk. JVP elevated. 3+ bilateral LE edema. Radial and DP pulses 2+ and symmetric. Respiratory: Normal respiratory rate and rhythm. Shallow CTAB without rales or wheezes. GI: Abdomen large round, tense with ascites, shifting dullness. BS heard.  MSK: No deformities or effusions. No clubbing, mild acrocyanosis. 1+ pedal edema  bilateral. Neuro: Cranial nerves normal. No asterexis. Sensorium intact and responding to questions, attention normal. Speech is fluent. Moves all extremities equally and with normal coordination.  Psych: Affect normal. Judgment and insight appear normal.   Data Reviewed: Basic Metabolic Panel:  Recent Labs Lab 11/20/15 1941 11/21/15 0345 11/22/15 0427  NA 131* 134* 134*  K 5.1 5.3* 5.9*  CL 104 106 108  CO2 17* 19* 18*  GLUCOSE 97 105* 87  BUN 48* 50* 56*  CREATININE 4.82* 4.91* 5.51*  CALCIUM 8.8* 8.6* 8.3*  PHOS  --   --  3.5   Liver Function Tests:  Recent Labs Lab 11/20/15 1941 11/22/15 0427  AST 36  --   ALT 14*  --   ALKPHOS 163*  --   BILITOT 2.2*  --   PROT 7.6  --   ALBUMIN 2.9* 3.1*   No results for input(s): LIPASE, AMYLASE in the last 168 hours. No results for input(s): AMMONIA in the last 168 hours. CBC:  Recent Labs Lab 11/20/15 1941 11/21/15 0345 11/21/15 2142 11/22/15 0427  WBC 5.0 3.6*  --  3.9*  HGB 7.4* 6.1* 8.2* 8.2*  HCT 22.5* 18.2* 24.2* 24.4*  MCV 100.9* 100.0  --  96.4  PLT 104* 82*  --  42*   Cardiac Enzymes: No results for input(s): CKTOTAL, CKMB, CKMBINDEX, TROPONINI in the last 168 hours. BNP (last 3 results) No results for input(s): PROBNP in the last 8760 hours. CBG:  Recent Labs Lab 11/20/15 1929  GLUCAP 87    Recent Results (from the past 240 hour(s))  Culture, body fluid-bottle     Status: None (Preliminary result)   Collection Time: 11/21/15  4:30 PM  Result Value Ref Range Status   Specimen Description FLUID PERITONEAL  Final   Special Requests BOTTLES DRAWN AEROBIC AND ANAEROBIC 5CC  Final   Gram Stain   Final    RARE WBC SEEN NO ORGANISMS SEEN Performed at [facility]    Culture PENDING  Incomplete   Report Status PENDING  Incomplete     Studies: US Abdomen Complete  11/21/2015  CLINICAL DATA:  Renal failure. History of hypertension, GERD, alcoholic cirrhosis. EXAM: ABDOMEN  ULTRASOUND COMPLETE COMPARISON:  None. FINDINGS: Gallbladder: Gallbladder walls are mildly thickened, measuring 4-5 mm thickness, likely reactive to the adjacent ascites and liver disease. Questionable small stones in the gallbladder neck region. Common bile duct: Diameter: Normal at 5 mm. Liver: Cirrhotic-appearing liver. No focal mass or lesion identified within the liver. Portal vein is shown to be patent with appropriate hepatopetal direction of blood flow. IVC: No abnormality visualized. Pancreas: Obscured by overlying bowel gas. Spleen: Enlarged, measuring 15 x 6 x 14 cm. No focal mass or lesion identified within the spleen. Right Kidney: Length: 11.7 cm. Echogenicity within normal limits. No mass or hydronephrosis visualized. Left Kidney: Length: 9.8 cm. Left renal cyst measuring 4 cm. Left kidney otherwise unremarkable without hydronephrosis. Abdominal aorta: No aneurysm visualized. Portions obscured by overlying bowel gas. Other findings: Ascites throughout the abdomen, most prominent within the right upper quadrant. IMPRESSION: 1. Ascites. 2. Cirrhotic-appearing liver. 3. Gallbladder walls mildly thickened, likely reactive to adjacent ascites and liver  disease. Questionable small gallstones. No bile duct dilatation. 4. Splenomegaly. Electronically Signed   By: Franki Cabot M.D.   On: 11/21/2015 17:30   US Paracentesis  11/21/2015  INDICATION: Patient with a history of cirrhosis. Worsening abdominal distention. Request made for paracentesis to rule out spontaneous bacterial peritonitis. Request for only enough fluid for diagnostic purposes. His kidney function is too bad for a therapeutic paracentesis at this time. EXAM: ULTRASOUND GUIDED DIAGNOSTIC PARACENTESIS MEDICATIONS: 1% LIDOCAINE. COMPLICATIONS: None immediate. PROCEDURE: Informed written consent was obtained from the patient after a discussion of the risks, benefits and alternatives to treatment. A timeout was performed prior to the initiation  of the procedure. Initial ultrasound scanning demonstrates a large amount of ascites within the right lower abdominal quadrant. The right lower abdomen was prepped and draped in the usual sterile fashion. 1% lidocaine was used for local anesthesia. Following this, a 19 gauge, 7-cm, Yueh catheter was introduced. An ultrasound image was saved for documentation purposes. The limited paracentesis was performed. The catheter was removed and a dressing was applied. The patient tolerated the procedure well without immediate post procedural complication. FINDINGS: A total of approximately 0.1 L of clear yellow fluid was removed. Samples were sent to the laboratory as requested by the clinical team. IMPRESSION: Successful ultrasound-guided paracentesis yielding 0.1 liters of peritoneal fluid. Read by: Saverio Danker, PA-C Electronically Signed   By: Jacqulynn Cadet M.D.   On: 11/21/2015 16:44    Scheduled Meds: . sodium chloride   Intravenous Once  . cefTRIAXone (ROCEPHIN)  IV  1 g Intravenous Q24H  . folic acid  1 mg Oral Daily  . furosemide  40 mg Intravenous Once  . levothyroxine  50 mcg Oral QAC breakfast  . multivitamin with minerals  1 tablet Oral Daily  . nadolol  40 mg Oral Daily  . pantoprazole  40 mg Oral BID AC  . sodium polystyrene  45 g Oral Once  . thiamine  100 mg Oral Daily   Or  . thiamine  100 mg Intravenous Daily  . venlafaxine XR  150 mg Oral Q breakfast   Continuous Infusions:   Principal Problem:   Acute renal failure (HCC) Active Problems:   Essential hypertension   Alcoholic cirrhosis (HCC)   Alcohol use disorder, moderate, dependence (HCC)   Ascites   Anemia, unspecified   Hyponatremia   Thrombocytopenia ([place])  Time spent:   Irwin Brakeman, MD, FAAFP [facility] Pager [phone]  If 7PM-7AM, please contact night-coverage [email or web address] Password [facility] 11/22/2015, 11:57 AM    LOS: 2 days

## 2015-11-22 NOTE — Progress Notes (Signed)
Patient ID: Austin Padilla., male   DOB: 22-Dec-1960, 55 y.o.   MRN: [id number] [facility] Progress Note   Assessment/ Plan:   1. AKI (likely on CKD stage III): Nonoliguric with worsening of renal function noted from labs earlier this morning. Urine electrolytes do not suggest hepatorenal syndrome given elevated urinary sodium and presence of protein/RBCs on UA. With tense ascites, may be beneficial to check bladder pressures as has intra-abdominal hypertension contributing to his renal insufficiency with impaired renal perfusion. If elevated, would recommend paracentesis with tandem 50gm intravenous albumin infusion if >3L fluid taken off. No acute indications for dialysis at this time.  2.  Hyperkalemia: Secondary to acute renal failure and recent use of  spironolactone-the hyperkalemic effect of spironolactone may last up to 5 days and I would favor retreating with Kayexalate and give additional bolus of Lasix. 3. Acute blood loss anemia:  status post PRBC transfusion with some improvement of hemoglobin-continue to monitor for overt losses given high risk status with portal hypertension.  4. Alcoholic cirrhosis: with recent/active alcohol intake-appears motivated to quit based on questions that he has asked earlier.  5. Thrombocytopenia:  associated with portal hypertension and splenic sequestration-downtrending platelet count noted however, without overt bleed   Subjective:   Reports to be feeling fair-denies any chest pain or shortness of breath   Objective:   BP 124/70 mmHg  Pulse 79  Temp(Src) 98.2 F (36.8 C) (Oral)  Resp 20  Ht 5\' 11"  (1.803 m)  Wt 117.3 kg (258 lb 9.6 oz)  BMI 36.08 kg/m2  SpO2 100%  Intake/Output Summary (Last 24 hours) at 11/22/15 1129 Last data filed at 11/22/15 1008  Gross per 24 hour  Intake    938 ml  Output    925 ml  Net     13 ml   Weight change:   Physical Exam: [id number] resting in bed, watching television, eating  popsicles CVS: Pulse regular rhythm, S1 and S2 with ejection systolic murmur RespDiminished breath sounds over bases-poor inspiratory effort [id number] firm, distended, with fluid wave Ext:3+ lower extremity edema  Imaging: US Abdomen Complete  11/21/2015  CLINICAL DATA:  Renal failure. History of hypertension, GERD, alcoholic cirrhosis. EXAM: ABDOMEN ULTRASOUND COMPLETE COMPARISON:  None. FINDINGS: Gallbladder: Gallbladder walls are mildly thickened, measuring 4-5 mm thickness, likely reactive to the adjacent ascites and liver disease. Questionable small stones in the gallbladder neck region. Common bile duct: Diameter: Normal at 5 mm. Liver: Cirrhotic-appearing liver. No focal mass or lesion identified within the liver. Portal vein is shown to be patent with appropriate hepatopetal direction of blood flow. IVC: No abnormality visualized. Pancreas: Obscured by overlying bowel gas. Spleen: Enlarged, measuring 15 x 6 x 14 cm. No focal mass or lesion identified within the spleen. Right Kidney: Length: 11.7 cm. Echogenicity within normal limits. No mass or hydronephrosis visualized. Left Kidney: Length: 9.8 cm. Left renal cyst measuring 4 cm. Left kidney otherwise unremarkable without hydronephrosis. Abdominal aorta: No aneurysm visualized. Portions obscured by overlying bowel gas. Other findings: Ascites throughout the abdomen, most prominent within the right upper quadrant. IMPRESSION: 1. Ascites. 2. Cirrhotic-appearing liver. 3. Gallbladder walls mildly thickened, likely reactive to adjacent ascites and liver disease. Questionable small gallstones. No bile duct dilatation. 4. Splenomegaly. Electronically Signed   By: Franki Cabot M.D.   On: 11/21/2015 17:30   US Paracentesis  11/21/2015  INDICATION: Patient with a history of cirrhosis. Worsening abdominal distention. Request made for paracentesis to rule out spontaneous bacterial peritonitis. Request  for only enough fluid for diagnostic purposes. His  kidney function is too bad for a therapeutic paracentesis at this time. EXAM: ULTRASOUND GUIDED DIAGNOSTIC PARACENTESIS MEDICATIONS: 1% LIDOCAINE. COMPLICATIONS: None immediate. PROCEDURE: Informed written consent was obtained from the patient after a discussion of the risks, benefits and alternatives to treatment. A timeout was performed prior to the initiation of the procedure. Initial ultrasound scanning demonstrates a large amount of ascites within the right lower abdominal quadrant. The right lower abdomen was prepped and draped in the usual sterile fashion. 1% lidocaine was used for local anesthesia. Following this, a 19 gauge, 7-cm, Yueh catheter was introduced. An ultrasound image was saved for documentation purposes. The limited paracentesis was performed. The catheter was removed and a dressing was applied. The patient tolerated the procedure well without immediate post procedural complication. FINDINGS: A total of approximately 0.1 L of clear yellow fluid was removed. Samples were sent to the laboratory as requested by the clinical team. IMPRESSION: Successful ultrasound-guided paracentesis yielding 0.1 liters of peritoneal fluid. Read by: Saverio Danker, PA-C Electronically Signed   By: Jacqulynn Cadet M.D.   On: 11/21/2015 16:44    Labs: BMET  Recent Labs Lab 11/20/15 1941 11/21/15 0345 11/22/15 0427  NA 131* 134* 134*  K 5.1 5.3* 5.9*  CL 104 106 108  CO2 17* 19* 18*  GLUCOSE 97 105* 87  BUN 48* 50* 56*  CREATININE 4.82* 4.91* 5.51*  CALCIUM 8.8* 8.6* 8.3*  PHOS  --   --  3.5   CBC  Recent Labs Lab 11/20/15 1941 11/21/15 0345 11/21/15 2142 11/22/15 0427  WBC 5.0 3.6*  --  3.9*  HGB 7.4* 6.1* 8.2* 8.2*  HCT 22.5* 18.2* 24.2* 24.4*  MCV 100.9* 100.0  --  96.4  PLT 104* 82*  --  42*   Medications:    . sodium chloride   Intravenous Once  . cefTRIAXone (ROCEPHIN)  IV  1 g Intravenous Q24H  . folic acid  1 mg Oral Daily  . levothyroxine  50 mcg Oral QAC breakfast  .  multivitamin with minerals  1 tablet Oral Daily  . nadolol  40 mg Oral Daily  . pantoprazole  40 mg Oral BID AC  . thiamine  100 mg Oral Daily   Or  . thiamine  100 mg Intravenous Daily  . venlafaxine XR  150 mg Oral Q breakfast   Elmarie Shiley, MD 11/22/2015, 11:29 AM

## 2015-11-22 NOTE — Progress Notes (Signed)
Progress Note   Subjective  Chief Complaint: Cirrhosis with ascites  Pt found comfortably asleep this morning, easily awoken, no abdominal pain, tolerating clear liquid diet, no new complaints or concerns   Objective   Vital signs in last 24 hours: Temp:  [97.9 F (36.6 C)-98.4 F (36.9 C)] 98.2 F (36.8 C) (07/22 0655) Pulse Rate:  [65-76] 72 (07/22 0655) Resp:  [17-20] 20 (07/22 0655) BP: (127-143)/(71-87) 143/77 mmHg (07/22 0655) SpO2:  [95 %-100 %] 100 % (07/22 0655) Last BM Date: 11/20/15 General: Caucasian male in NAD Heart:  Regular rate and rhythm; no murmurs Lungs: Respirations even and unlabored, lungs CTA bilaterally Abdomen:  Tense ascites, marked distension, no tenderness. Normal bowel sounds. Extremities:  3+ b/l lower ext edema Neurologic:  Alert and oriented,  grossly normal neurologically. Psych:  Cooperative. Normal mood and affect.  Intake/Output from previous day: 07/21 0701 - 07/22 0700 In: 548 [P.O.:180; I.V.:10; Blood:358] Out: 725 [Urine:725]  Lab Results:  Recent Labs  11/20/15 1941 11/21/15 0345 11/21/15 2142 11/22/15 0427  WBC 5.0 3.6*  --  3.9*  HGB 7.4* 6.1* 8.2* 8.2*  HCT 22.5* 18.2* 24.2* 24.4*  PLT 104* 82*  --  42*   BMET  Recent Labs  11/20/15 1941 11/21/15 0345 11/22/15 0427  NA 131* 134* 134*  K 5.1 5.3* 5.9*  CL 104 106 108  CO2 17* 19* 18*  GLUCOSE 97 105* 87  BUN 48* 50* 56*  CREATININE 4.82* 4.91* 5.51*  CALCIUM 8.8* 8.6* 8.3*   LFT  Recent Labs  11/20/15 1941 11/22/15 0427  PROT 7.6  --   ALBUMIN 2.9* 3.1*  AST 36  --   ALT 14*  --   ALKPHOS 163*  --   BILITOT 2.2*  --    PT/INR  Recent Labs  11/20/15 1941  LABPROT 17.4*  INR 1.42    Studies/Results: US Abdomen Complete  11/21/2015  CLINICAL DATA:  Renal failure. History of hypertension, GERD, alcoholic cirrhosis. EXAM: ABDOMEN ULTRASOUND COMPLETE COMPARISON:  None. FINDINGS: Gallbladder: Gallbladder walls are mildly thickened, measuring  4-5 mm thickness, likely reactive to the adjacent ascites and liver disease. Questionable small stones in the gallbladder neck region. Common bile duct: Diameter: Normal at 5 mm. Liver: Cirrhotic-appearing liver. No focal mass or lesion identified within the liver. Portal vein is shown to be patent with appropriate hepatopetal direction of blood flow. IVC: No abnormality visualized. Pancreas: Obscured by overlying bowel gas. Spleen: Enlarged, measuring 15 x 6 x 14 cm. No focal mass or lesion identified within the spleen. Right Kidney: Length: 11.7 cm. Echogenicity within normal limits. No mass or hydronephrosis visualized. Left Kidney: Length: 9.8 cm. Left renal cyst measuring 4 cm. Left kidney otherwise unremarkable without hydronephrosis. Abdominal aorta: No aneurysm visualized. Portions obscured by overlying bowel gas. Other findings: Ascites throughout the abdomen, most prominent within the right upper quadrant. IMPRESSION: 1. Ascites. 2. Cirrhotic-appearing liver. 3. Gallbladder walls mildly thickened, likely reactive to adjacent ascites and liver disease. Questionable small gallstones. No bile duct dilatation. 4. Splenomegaly. Electronically Signed   By: Franki Cabot M.D.   On: 11/21/2015 17:30   US Paracentesis  11/21/2015  INDICATION: Patient with a history of cirrhosis. Worsening abdominal distention. Request made for paracentesis to rule out spontaneous bacterial peritonitis. Request for only enough fluid for diagnostic purposes. His kidney function is too bad for a therapeutic paracentesis at this time. EXAM: ULTRASOUND GUIDED DIAGNOSTIC PARACENTESIS MEDICATIONS: 1% LIDOCAINE. COMPLICATIONS: None immediate. PROCEDURE: Informed written consent  was obtained from the patient after a discussion of the risks, benefits and alternatives to treatment. A timeout was performed prior to the initiation of the procedure. Initial ultrasound scanning demonstrates a large amount of ascites within the right lower  abdominal quadrant. The right lower abdomen was prepped and draped in the usual sterile fashion. 1% lidocaine was used for local anesthesia. Following this, a 19 gauge, 7-cm, Yueh catheter was introduced. An ultrasound image was saved for documentation purposes. The limited paracentesis was performed. The catheter was removed and a dressing was applied. The patient tolerated the procedure well without immediate post procedural complication. FINDINGS: A total of approximately 0.1 L of clear yellow fluid was removed. Samples were sent to the laboratory as requested by the clinical team. IMPRESSION: Successful ultrasound-guided paracentesis yielding 0.1 liters of peritoneal fluid. Read by: Saverio Danker, PA-C Electronically Signed   By: Jacqulynn Cadet M.D.   On: 11/21/2015 16:44       Assessment / Plan:   Impression: 1. Cirrhosis with ascites and portal hypertension: MELD 30; Diagnostic paracentesis 11/21/15 SAAG 2.1 confirming [portal htn cause of ascites 2. Anemia: This has been evaluated in the past, colo 2016-showed no etiology, no recent EGD- patient would likely benefit from evaluation in the future, hgb holding at 8.2 this morning after blood transfusion yesterday 3. Acute kidney failure: Creatinine increased overnight from 4.82-4.91-5.51 (baseline 0.7 a year ago) 4. Decrease in appetite: This is likely multifactorial due to the patient's increasing ascites as well as depression, patient initally requested appetite stimulant, though appears to be tolerating clear liquid diet 5. Alcohol use disorder: Patient reports 4 drinks per night, "spirits", this is a decrease from previous, he does express interest in quitting. He asked about a medicine he can take which could decrease his desire 6. Hyponatremia 7. Thrombocytopenia: From portal hypertension 8. GERD: Controlled on Protonix 40 mg once daily  Plan: 1. Continue supportive measures 2. Continue to monitor hgb with transfusion as necessary 3.  Please await any further recommendations from Dr. Ardis Hughs  Thank you for your kind consultation, we will continue to follow along.   LOS: 2 days   Levin Erp  11/22/2015, 9:12 AM  Pager # [phone]  ________________________________________________________________________  Velora Heckler GI MD note:  I personally examined the patient, reviewed the data and agree with the assessment and plan described above.  MAssive ascites, no SBP based on diagnostic paracentesis.  Cr is rising and that is the most concerning thing currently.  Nephrology has seen him and made several recommendations today.  He has anemia, likely this is multifactorial. He will eventually need EGD to screen for varices but since he is not overtly bleeding I don't think that is urgent, would give time for kidney issues hopefully resolve.  Will follow along.   Owens Loffler, MD [facility] Pager [phone]

## 2015-11-23 DIAGNOSIS — N19 Unspecified kidney failure: Secondary | ICD-10-CM

## 2015-11-23 DIAGNOSIS — E875 Hyperkalemia: Secondary | ICD-10-CM

## 2015-11-23 DIAGNOSIS — N179 Acute kidney failure, unspecified: Secondary | ICD-10-CM | POA: Diagnosis present

## 2015-11-23 LAB — CBC
HEMATOCRIT: 23.5 % — AB (ref 39.0–52.0)
Hemoglobin: 7.8 g/dL — ABNORMAL LOW (ref 13.0–17.0)
MCH: 32.2 pg (ref 26.0–34.0)
MCHC: 33.2 g/dL (ref 30.0–36.0)
MCV: 97.1 fL (ref 78.0–100.0)
Platelets: 87 10*3/uL — ABNORMAL LOW (ref 150–400)
RBC: 2.42 MIL/uL — ABNORMAL LOW (ref 4.22–5.81)
RDW: 19.9 % — AB (ref 11.5–15.5)
WBC: 4.5 10*3/uL (ref 4.0–10.5)

## 2015-11-23 LAB — TYPE AND SCREEN
ABO/RH(D): B POS
Antibody Screen: NEGATIVE
UNIT DIVISION: 0
Unit division: 0

## 2015-11-23 LAB — RENAL FUNCTION PANEL
Albumin: 2.7 g/dL — ABNORMAL LOW (ref 3.5–5.0)
Anion gap: 8 (ref 5–15)
BUN: 55 mg/dL — AB (ref 6–20)
CHLORIDE: 107 mmol/L (ref 101–111)
CO2: 19 mmol/L — AB (ref 22–32)
CREATININE: 6.18 mg/dL — AB (ref 0.61–1.24)
Calcium: 7.8 mg/dL — ABNORMAL LOW (ref 8.9–10.3)
GFR calc Af Amer: 11 mL/min — ABNORMAL LOW (ref >60)
GFR calc non Af Amer: 9 mL/min — ABNORMAL LOW (ref >60)
Glucose, Bld: 92 mg/dL (ref 65–99)
Phosphorus: 3.5 mg/dL (ref 2.5–4.6)
Potassium: 5.6 mmol/L — ABNORMAL HIGH (ref 3.5–5.1)
Sodium: 134 mmol/L — ABNORMAL LOW (ref 135–145)

## 2015-11-23 LAB — MAGNESIUM: Magnesium: 1.2 mg/dL — ABNORMAL LOW (ref 1.7–2.4)

## 2015-11-23 MED ORDER — FUROSEMIDE 10 MG/ML IJ SOLN: 40.0000 mg | Freq: Once | INTRAMUSCULAR | Status: DC

## 2015-11-23 MED ORDER — ALBUMIN HUMAN 25 % IV SOLN
50.0000 g | Freq: Once | INTRAVENOUS | Status: AC
  Administered 2015-11-24: 50 g via INTRAVENOUS
  Filled 2015-11-23 (×2): qty 200

## 2015-11-23 MED ORDER — SODIUM POLYSTYRENE SULFONATE 15 GM/60ML PO SUSP
45.0000 g | Freq: Four times a day (QID) | ORAL | Status: AC
Start: 2015-11-23 — End: 2015-11-23
  Administered 2015-11-23 (×3): 45 g via ORAL
  Filled 2015-11-23 (×3): qty 180

## 2015-11-23 MED ORDER — ALBUMIN HUMAN 25 % IV SOLN
25.0000 g | Freq: Once | INTRAVENOUS | Status: AC
  Administered 2015-11-23: 25 g via INTRAVENOUS
  Filled 2015-11-23: qty 100

## 2015-11-23 MED ORDER — FUROSEMIDE 10 MG/ML IJ SOLN
60.0000 mg | Freq: Once | INTRAMUSCULAR | Status: AC
  Administered 2015-11-23: 60 mg via INTRAVENOUS
  Filled 2015-11-23: qty 6

## 2015-11-23 MED ORDER — FUROSEMIDE 10 MG/ML IJ SOLN
60.0000 mg | Freq: Once | INTRAMUSCULAR | Status: AC
  Administered 2015-11-23: 20 mg via INTRAVENOUS
  Filled 2015-11-23: qty 6

## 2015-11-23 NOTE — Progress Notes (Signed)
Progress Note   Subjective  Chief Complaint:Cirrhosis with ascites  Pt found sitting up in bed comfortably, he tells me he had one very loose bowel movement yesterday and to him what seemed like a "decreased" amount of urine since yesterday. He is aware that his kidney function is worsening and is amenable to Nephrology's plans. He just wants to get better. He does tell me that being on HCTZ in the past lowered his potassium and he wonders if this could help him now.    Objective   Vital signs in last 24 hours: Temp:  [97.8 F (36.6 C)-98 F (36.7 C)] 98 F (36.7 C) (07/23 0534) Pulse Rate:  [70-79] 77 (07/23 0534) Resp:  [16-17] 17 (07/23 0534) BP: (122-132)/(68-76) 123/71 (07/23 0534) SpO2:  [100 %] 100 % (07/23 0534) Last BM Date: 11/22/15 General: Caucasian male in NAD Heart:  Regular rate and rhythm; no murmurs Lungs: Respirations even and unlabored, lungs CTA bilaterally Abdomen:  Tense ascites, marked distension, no tenderness. Normal bowel sounds. Extremities:  3+ b/l lower ext edema Neurologic:  Alert and oriented,  grossly normal neurologically. Psych:  Cooperative. Normal mood and affect.  Intake/Output from previous day: 07/22 0701 - 07/23 0700 In: 880 [P.O.:880] Out: 575 [Urine:575]  Lab Results:  Recent Labs  11/21/15 0345 11/21/15 2142 11/22/15 0427 11/23/15 0350  WBC 3.6*  --  3.9* 4.5  HGB 6.1* 8.2* 8.2* 7.8*  HCT 18.2* 24.2* 24.4* 23.5*  PLT 82*  --  42* 87*   BMET  Recent Labs  11/21/15 0345 11/22/15 0427 11/23/15 0350  NA 134* 134* 134*  K 5.3* 5.9* 5.6*  CL 106 108 107  CO2 19* 18* 19*  GLUCOSE 105* 87 92  BUN 50* 56* 55*  CREATININE 4.91* 5.51* 6.18*  CALCIUM 8.6* 8.3* 7.8*   LFT  Recent Labs  11/20/15 1941  11/23/15 0350  PROT 7.6  --   --   ALBUMIN 2.9*  < > 2.7*  AST 36  --   --   ALT 14*  --   --   ALKPHOS 163*  --   --   BILITOT 2.2*  --   --   < > = values in this interval not displayed.  Recent Labs  11/20/15 1941  LABPROT 17.4*  INR 1.42    Studies/Results: US Abdomen Complete  Result Date: 11/21/2015 CLINICAL DATA:  Renal failure. History of hypertension, GERD, alcoholic cirrhosis. EXAM: ABDOMEN ULTRASOUND COMPLETE COMPARISON:  None. FINDINGS: Gallbladder: Gallbladder walls are mildly thickened, measuring 4-5 mm thickness, likely reactive to the adjacent ascites and liver disease. Questionable small stones in the gallbladder neck region. Common bile duct: Diameter: Normal at 5 mm. Liver: Cirrhotic-appearing liver. No focal mass or lesion identified within the liver. Portal vein is shown to be patent with appropriate hepatopetal direction of blood flow. IVC: No abnormality visualized. Pancreas: Obscured by overlying bowel gas. Spleen: Enlarged, measuring 15 x 6 x 14 cm. No focal mass or lesion identified within the spleen. Right Kidney: Length: 11.7 cm. Echogenicity within normal limits. No mass or hydronephrosis visualized. Left Kidney: Length: 9.8 cm. Left renal cyst measuring 4 cm. Left kidney otherwise unremarkable without hydronephrosis. Abdominal aorta: No aneurysm visualized. Portions obscured by overlying bowel gas. Other findings: Ascites throughout the abdomen, most prominent within the right upper quadrant. IMPRESSION: 1. Ascites. 2. Cirrhotic-appearing liver. 3. Gallbladder walls mildly thickened, likely reactive to adjacent ascites and liver disease. Questionable small gallstones. No bile duct dilatation. 4. Splenomegaly.  Electronically Signed   By: Franki Cabot M.D.   On: 11/21/2015 17:30   US Paracentesis  Result Date: 11/21/2015 INDICATION: Patient with a history of cirrhosis. Worsening abdominal distention. Request made for paracentesis to rule out spontaneous bacterial peritonitis. Request for only enough fluid for diagnostic purposes. His kidney function is too bad for a therapeutic paracentesis at this time. EXAM: ULTRASOUND GUIDED DIAGNOSTIC PARACENTESIS MEDICATIONS: 1%  LIDOCAINE. COMPLICATIONS: None immediate. PROCEDURE: Informed written consent was obtained from the patient after a discussion of the risks, benefits and alternatives to treatment. A timeout was performed prior to the initiation of the procedure. Initial ultrasound scanning demonstrates a large amount of ascites within the right lower abdominal quadrant. The right lower abdomen was prepped and draped in the usual sterile fashion. 1% lidocaine was used for local anesthesia. Following this, a 19 gauge, 7-cm, Yueh catheter was introduced. An ultrasound image was saved for documentation purposes. The limited paracentesis was performed. The catheter was removed and a dressing was applied. The patient tolerated the procedure well without immediate post procedural complication. FINDINGS: A total of approximately 0.1 L of clear yellow fluid was removed. Samples were sent to the laboratory as requested by the clinical team. IMPRESSION: Successful ultrasound-guided paracentesis yielding 0.1 liters of peritoneal fluid. Read by: Saverio Danker, PA-C Electronically Signed   By: Jacqulynn Cadet M.D.   On: 11/21/2015 16:44       Assessment / Plan:   Impression: 1. Cirrhosis with ascites and portal hypertension: MELD 30; Diagnostic paracentesis 11/21/15 SAAG 2.1 confirming portal htn cause of ascites, no signs of SBP-ascites increased overnight 2. Anemia: This has been evaluated in the past, colo 2016-showed no etiology, no recent EGD- patient would likely benefit from evaluation in the future, hgb remaining stable after blood transfusion 11/21/15 3. Acute kidney failure: Creatinine increasing from 4.82-4.91-5.51-6.18 (baseline 0.7 a year ago) 4. Decrease in appetite: This is likely multifactorial due to the patient's increasing ascites as well as depression, patient initally requested appetite stimulant, though appears to be tolerating clear liquid diet 5. Alcohol use disorder: Patient reports 4 drinks per night,  "spirits", this is a decrease from previous, he does express interest in quitting. He asked about a medicine he can take which could decrease his desire 6. Hyponatremia 7. Thrombocytopenia: From portal hypertension 8. GERD: Controlled on Protonix 40 mg once daily  Plan: 1. Continue supportive measures 2. Continue to monitor hgb with transfusion as necessary 3. Agree with nephrology's plan for paracentesis, appreciate their further recommendations 4. Will discuss above with Dr. Ardis Hughs  Thank you for your kind consultation, we will continue to follow along.    LOS: 3 days   Levin Erp  11/23/2015, 8:58 AM  Pager # [phone]  ________________________________________________________________________  Velora Heckler GI MD note:  I personally examined the patient, reviewed the data and agree with the assessment and plan described above.  His renal function is not improving, Cr actually higher again today. I agree with nephrology that his ascites, intraabdominal pressure may be playing a role.  The plan laid out by Dr. Posey Pronto yesterday seems very reasonable.  I will leave it to primary team to arrange for bladder pressure check and if elevated to set up large volume paracentesis.  I imagine that paracentesis could yield 8-10 liters of fluid.  Should probably cap it at 5 or 6 and given 50gm albumin infusion.  Eventual workup of his anemia, screen for varices with EGD but would like his renal situation to resolve  or at least stabilize.    Owens Loffler, MD [facility] Pager [phone]

## 2015-11-23 NOTE — Progress Notes (Addendum)
PROGRESS NOTE    Austin Padilla.  AP:[id number]  DOB: Nov 01, 1960  DOA: 11/20/2015 PCP: Joycelyn Man, MD Outpatient Specialists:  Hospital course: Austin Padilla. is a 55 y.o. 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  The patient was in his usual state of health until about one month ago when he started service decreased appetite, increased swelling in the belly, and weakness. The weakness has progressed to be severe, so that he needs a cane, cannot stand in the kitchen long enough to make himself dinner, and gets short of breath with exertion. Now over the last 2 weeks he has had dark urine, oliguria, and marked pedal edema. Today he went to his PCP, who recommended he be evaluated at the hospital.  ED course: -Afebrile, heart rate 100, respirations and oxygen saturation normal, slightly hypertensive -Na 131, K 5.1, Cr 4.8 (baseline 0.7 a year ago), WBC 5 K, Hgb 7.4, previous baseline normal -INR normal, platelets 104K, albumin 2.9  Assessment & Plan:  1. Acute kidney failure: with hyperkalemia - continues to worsen Decreased oral intake, discontinued spironolactone. No NSAIDs. Renally dose medications. Avoid nephrotoxins.  -Nephrology consult appreciated.  I spoke with Dr.Coladonato 7/21 and Dr. Posey Pronto 7/23.  -Urine electrolytes reviewed --albumin/ lasix and kayexalate, repeat K level later today.  --Await nephrology team recommendations. --Placing on cardiac monitor given persistent hyperkalemia and electrolyte abnormalities.   2. Anemia, of Chronic Disease:  -GI following.  -s/p 2 units PRBC, hemoglobin improved but now trending down again.  Following.  No active GI bleeding found.   3. Cirrhosis with ascites:  -Continue nadolol -Holding spironolactone -Consult to [facility] -RUQ [place] for ascites, HCC  -Check AFP --Order IR  paracentesis 5 L with 50 gm albumin infusion given immediately prior to paracentesis.   4. Alcohol use disorder:  -CIWA protocol, bedside counseling  5. Hyponatremia:  Hypervolemic, from liver disease. Slightly improved.   6. HTN:  Hypertensive at admission. -Continue nadolol  7. Thrombocytopenia:  From portal hypertension and cirrhosis, chronic alcoholism  8. Hypothyroidism: -Continue levothyroxine -Check TSH  9. GERD: -Continue PPI  10. Chronic Alcoholism -- Monitor for withdrawal with CIWA protocol.   11.  UTI - started ceftriaxone IV, awaiting cultures  DVT prophylaxis: SCDs  Code Status: FULL  Family Communication: None present  Disposition Plan: TBD Consults called: GI, [place], Nephrology Admission status: INPATIENT, med surg  Subjective: Pt having loose stools, no blood seen.   Objective: Vitals:   11/22/15 0958 11/22/15 1200 11/22/15 1900 11/23/15 0534  BP: 124/70 122/76 132/68 123/71  Pulse: 79 70 72 77  Resp:   16 17  Temp:   97.8 F (36.6 C) 98 F (36.7 C)  TempSrc:   Oral Oral  SpO2:   100% 100%  Weight:      Height:        Intake/Output Summary (Last 24 hours) at 11/23/15 1041 Last data filed at 11/23/15 1001  Gross per 24 hour  Intake              835 ml  Output              675 ml  Net              160 ml   Filed Weights   11/20/15 2213  Weight: 117.3 kg (258 lb 9.6 oz)   Exam:  General appearance: Older  male, alert and in no acute distress.  Eyes: Conjunctiva normal, lids and lashes normal. Mild icterus. PERRL.  ENT: No nasal deformity, discharge. OP moist without lesions.  Lymph: No cervical or supraclavicular lymphadenopathy. Skin: Warm and dry. Some spider angiomata and palmar erythema. Crusted wounds on bilateral feet. Cardiac: RRR, nl S1-S2, no murmurs appreciated. Capillary refill is brisk. JVP elevated. 3+ bilateral LE edema. Radial and DP pulses 2+ and symmetric. Respiratory: Normal respiratory rate and  rhythm. Shallow CTAB without rales or wheezes. GI: Abdomen large round, tense with ascites, shifting dullness. BS heard.  MSK: No deformities or effusions. No clubbing, mild acrocyanosis. 1+ pedal edema bilateral. Neuro: Cranial nerves normal. No asterexis. Sensorium intact and responding to questions, attention normal. Speech is fluent. Moves all extremities equally and with normal coordination.  Psych: Affect normal. Judgment and insight appear normal.   Data Reviewed: Basic Metabolic Panel:  Recent Labs Lab 11/20/15 1941 11/21/15 0345 11/22/15 0427 11/23/15 0350  NA 131* 134* 134* 134*  K 5.1 5.3* 5.9* 5.6*  CL 104 106 108 107  CO2 17* 19* 18* 19*  GLUCOSE 97 105* 87 92  BUN 48* 50* 56* 55*  CREATININE 4.82* 4.91* 5.51* 6.18*  CALCIUM 8.8* 8.6* 8.3* 7.8*  MG  --   --   --  1.2*  PHOS  --   --  3.5 3.5   Liver Function Tests:  Recent Labs Lab 11/20/15 1941 11/22/15 0427 11/23/15 0350  AST 36  --   --   ALT 14*  --   --   ALKPHOS 163*  --   --   BILITOT 2.2*  --   --   PROT 7.6  --   --   ALBUMIN 2.9* 3.1* 2.7*   No results for input(s): LIPASE, AMYLASE in the last 168 hours. No results for input(s): AMMONIA in the last 168 hours. CBC:  Recent Labs Lab 11/20/15 1941 11/21/15 0345 11/21/15 2142 11/22/15 0427 11/23/15 0350  WBC 5.0 3.6*  --  3.9* 4.5  HGB 7.4* 6.1* 8.2* 8.2* 7.8*  HCT 22.5* 18.2* 24.2* 24.4* 23.5*  MCV 100.9* 100.0  --  96.4 97.1  PLT 104* 82*  --  42* 87*   Cardiac Enzymes: No results for input(s): CKTOTAL, CKMB, CKMBINDEX, TROPONINI in the last 168 hours. BNP (last 3 results) No results for input(s): PROBNP in the last 8760 hours. CBG:  Recent Labs Lab 11/20/15 1929  GLUCAP 87    Recent Results (from the past 240 hour(s))  Culture, body fluid-bottle     Status: None (Preliminary result)   Collection Time: 11/21/15  4:30 PM  Result Value Ref Range Status   Specimen Description FLUID PERITONEAL  Final   Special  Requests BOTTLES DRAWN AEROBIC AND ANAEROBIC 5CC  Final   Gram Stain RARE WBC SEEN NO ORGANISMS SEEN   Final   Culture   Final    NO GROWTH < 24 HOURS Performed at [facility]    Report Status PENDING  Incomplete     Studies: US Abdomen Complete  Result Date: 11/21/2015 CLINICAL DATA:  Renal failure. History of hypertension, GERD, alcoholic cirrhosis. EXAM: ABDOMEN ULTRASOUND COMPLETE COMPARISON:  None. FINDINGS: Gallbladder: Gallbladder walls are mildly thickened, measuring 4-5 mm thickness, likely reactive to the adjacent ascites and liver disease. Questionable small stones in the gallbladder neck region. Common bile duct: Diameter: Normal at 5 mm. Liver: Cirrhotic-appearing liver. No focal mass or lesion identified within the liver. Portal vein is shown to  be patent with appropriate hepatopetal direction of blood flow. IVC: No abnormality visualized. Pancreas: Obscured by overlying bowel gas. Spleen: Enlarged, measuring 15 x 6 x 14 cm. No focal mass or lesion identified within the spleen. Right Kidney: Length: 11.7 cm. Echogenicity within normal limits. No mass or hydronephrosis visualized. Left Kidney: Length: 9.8 cm. Left renal cyst measuring 4 cm. Left kidney otherwise unremarkable without hydronephrosis. Abdominal aorta: No aneurysm visualized. Portions obscured by overlying bowel gas. Other findings: Ascites throughout the abdomen, most prominent within the right upper quadrant. IMPRESSION: 1. Ascites. 2. Cirrhotic-appearing liver. 3. Gallbladder walls mildly thickened, likely reactive to adjacent ascites and liver disease. Questionable small gallstones. No bile duct dilatation. 4. Splenomegaly. Electronically Signed   By: Franki Cabot M.D.   On: 11/21/2015 17:30   US Paracentesis  Result Date: 11/21/2015 INDICATION: Patient with a history of cirrhosis. Worsening abdominal distention. Request made for paracentesis to rule out spontaneous bacterial peritonitis. Request for only  enough fluid for diagnostic purposes. His kidney function is too bad for a therapeutic paracentesis at this time. EXAM: ULTRASOUND GUIDED DIAGNOSTIC PARACENTESIS MEDICATIONS: 1% LIDOCAINE. COMPLICATIONS: None immediate. PROCEDURE: Informed written consent was obtained from the patient after a discussion of the risks, benefits and alternatives to treatment. A timeout was performed prior to the initiation of the procedure. Initial ultrasound scanning demonstrates a large amount of ascites within the right lower abdominal quadrant. The right lower abdomen was prepped and draped in the usual sterile fashion. 1% lidocaine was used for local anesthesia. Following this, a 19 gauge, 7-cm, Yueh catheter was introduced. An ultrasound image was saved for documentation purposes. The limited paracentesis was performed. The catheter was removed and a dressing was applied. The patient tolerated the procedure well without immediate post procedural complication. FINDINGS: A total of approximately 0.1 L of clear yellow fluid was removed. Samples were sent to the laboratory as requested by the clinical team. IMPRESSION: Successful ultrasound-guided paracentesis yielding 0.1 liters of peritoneal fluid. Read by: Saverio Danker, PA-C Electronically Signed   By: Jacqulynn Cadet M.D.   On: 11/21/2015 16:44    Scheduled Meds: . sodium chloride   Intravenous Once  . cefTRIAXone (ROCEPHIN)  IV  1 g Intravenous Q24H  . folic acid  1 mg Oral Daily  . furosemide  40 mg Intravenous Once  . levothyroxine  50 mcg Oral QAC breakfast  . multivitamin with minerals  1 tablet Oral Daily  . nadolol  40 mg Oral Daily  . pantoprazole  40 mg Oral BID AC  . sodium polystyrene  45 g Oral Q6H  . thiamine  100 mg Oral Daily   Or  . thiamine  100 mg Intravenous Daily  . venlafaxine XR  150 mg Oral Q breakfast   Continuous Infusions:   Principal Problem:   Acute renal failure (HCC) Active Problems:   Essential hypertension   Alcoholic  cirrhosis (HCC)   Alcohol use disorder, moderate, dependence (HCC)   Ascites   Anemia, unspecified   Hyponatremia   Thrombocytopenia ([place])  Time spent:   Irwin Brakeman, MD, FAAFP [facility] Pager [phone]  If 7PM-7AM, please contact night-coverage [email or web address] Password [email or web address] 11/23/2015, 10:41 AM    LOS: 3 days

## 2015-11-23 NOTE — Progress Notes (Signed)
Pt needs to be on telemetry per MD order, bed obtained and report called. Pt to be transferred to 4 floor Rm 1410.

## 2015-11-23 NOTE — Progress Notes (Signed)
Patient ID: [name], male   DOB: October 13, 1960, 55 y.o.   MRN: [id number] [facility] Progress Note   Assessment/ Plan:   1. AKI (likely on CKD stage III): Barely non-oliguric with decreased UOP and worsening of renal function noted from labs. The working diagnosis at this time is normotensive ATN with preceding diuretics and NSAIDs exposure. His physical exam suggests significant ascites which in all likelihood is causing intra-abdominal hypertension with venous congestion-specifically renal vein congestion that could further be contributing to his renal injury. I would recommend paracentesis of 4-5 L with 50 g intravenous albumin given immediately before the procedure to limit aggravating hemodynamic renal injury (this may need to be repeated daily or every other day for satisfactory decompression/alleviation of intra-abdominal pressures). He does not have any compelling need for dialysis at this time. Given marginal urine output-I will try intravenous albumin and intravenous Lasix in tandem to see if he can mobilize some of his volume excess.  2.  Hyperkalemia: Secondary to acute renal failure and recent use of  spironolactone-the hyperkalemic effect of spironolactone may last up to 5 days and I will repeat Kayexalate today along with increased furosemide. 3. Acute blood loss anemia:  status post PRBC transfusion with some improvement of hemoglobin-continue to monitor for overt losses given high risk status with portal hypertension.  4. Alcoholic cirrhosis: with recent/active alcohol intake-appears motivated to quit based on questions that he has asked earlier.  5. Thrombocytopenia:  associated with portal hypertension and splenic sequestration-downtrending platelet count noted however, without overt bleed   Subjective:   Reports to be feeling better this morning, had some cramping overnight-denies any chest pain or shortness of breath   Objective:   BP 128/71 (BP Location:  Right Arm)   Pulse 78   Temp 98.3 F (36.8 C) (Oral)   Resp 16   Ht 5\' 11"  (1.803 m)   Wt 124.9 kg (275 lb 6.4 oz)   SpO2 100%   BMI 38.41 kg/m   Intake/Output Summary (Last 24 hours) at 11/23/15 1136 Last data filed at 11/23/15 1001  Gross per 24 hour  Intake              835 ml  Output              675 ml  Net              160 ml   Weight change:   Physical Exam: [id number] resting in bed, watching television, eating popsicles CVS: Pulse regular rhythm, S1 and S2 with ejection systolic murmur RespDiminished breath sounds over bases-poor inspiratory effort [id number] firm, distended, with fluid wave Ext:3+ lower extremity edema  Imaging: US Abdomen Complete  Result Date: 11/21/2015 CLINICAL DATA:  Renal failure. History of hypertension, GERD, alcoholic cirrhosis. EXAM: ABDOMEN ULTRASOUND COMPLETE COMPARISON:  None. FINDINGS: Gallbladder: Gallbladder walls are mildly thickened, measuring 4-5 mm thickness, likely reactive to the adjacent ascites and liver disease. Questionable small stones in the gallbladder neck region. Common bile duct: Diameter: Normal at 5 mm. Liver: Cirrhotic-appearing liver. No focal mass or lesion identified within the liver. Portal vein is shown to be patent with appropriate hepatopetal direction of blood flow. IVC: No abnormality visualized. Pancreas: Obscured by overlying bowel gas. Spleen: Enlarged, measuring 15 x 6 x 14 cm. No focal mass or lesion identified within the spleen. Right Kidney: Length: 11.7 cm. Echogenicity within normal limits. No mass or hydronephrosis visualized. Left Kidney: Length: 9.8 cm. Left renal cyst measuring 4  cm. Left kidney otherwise unremarkable without hydronephrosis. Abdominal aorta: No aneurysm visualized. Portions obscured by overlying bowel gas. Other findings: Ascites throughout the abdomen, most prominent within the right upper quadrant. IMPRESSION: 1. Ascites. 2. Cirrhotic-appearing liver. 3. Gallbladder walls mildly  thickened, likely reactive to adjacent ascites and liver disease. Questionable small gallstones. No bile duct dilatation. 4. Splenomegaly. Electronically Signed   By: Franki Cabot M.D.   On: 11/21/2015 17:30   US Paracentesis  Result Date: 11/21/2015 INDICATION: Patient with a history of cirrhosis. Worsening abdominal distention. Request made for paracentesis to rule out spontaneous bacterial peritonitis. Request for only enough fluid for diagnostic purposes. His kidney function is too bad for a therapeutic paracentesis at this time. EXAM: ULTRASOUND GUIDED DIAGNOSTIC PARACENTESIS MEDICATIONS: 1% LIDOCAINE. COMPLICATIONS: None immediate. PROCEDURE: Informed written consent was obtained from the patient after a discussion of the risks, benefits and alternatives to treatment. A timeout was performed prior to the initiation of the procedure. Initial ultrasound scanning demonstrates a large amount of ascites within the right lower abdominal quadrant. The right lower abdomen was prepped and draped in the usual sterile fashion. 1% lidocaine was used for local anesthesia. Following this, a 19 gauge, 7-cm, Yueh catheter was introduced. An ultrasound image was saved for documentation purposes. The limited paracentesis was performed. The catheter was removed and a dressing was applied. The patient tolerated the procedure well without immediate post procedural complication. FINDINGS: A total of approximately 0.1 L of clear yellow fluid was removed. Samples were sent to the laboratory as requested by the clinical team. IMPRESSION: Successful ultrasound-guided paracentesis yielding 0.1 liters of peritoneal fluid. Read by: Saverio Danker, PA-C Electronically Signed   By: Jacqulynn Cadet M.D.   On: 11/21/2015 16:44    Labs: BMET  Recent Labs Lab 11/20/15 1941 11/21/15 0345 11/22/15 0427 11/23/15 0350  NA 131* 134* 134* 134*  K 5.1 5.3* 5.9* 5.6*  CL 104 106 108 107  CO2 17* 19* 18* 19*  GLUCOSE 97 105* 87 92   BUN 48* 50* 56* 55*  CREATININE 4.82* 4.91* 5.51* 6.18*  CALCIUM 8.8* 8.6* 8.3* 7.8*  PHOS  --   --  3.5 3.5   CBC  Recent Labs Lab 11/20/15 1941 11/21/15 0345 11/21/15 2142 11/22/15 0427 11/23/15 0350  WBC 5.0 3.6*  --  3.9* 4.5  HGB 7.4* 6.1* 8.2* 8.2* 7.8*  HCT 22.5* 18.2* 24.2* 24.4* 23.5*  MCV 100.9* 100.0  --  96.4 97.1  PLT 104* 82*  --  42* 87*   Medications:    . sodium chloride   Intravenous Once  . cefTRIAXone (ROCEPHIN)  IV  1 g Intravenous Q24H  . folic acid  1 mg Oral Daily  . furosemide  40 mg Intravenous Once  . levothyroxine  50 mcg Oral QAC breakfast  . multivitamin with minerals  1 tablet Oral Daily  . nadolol  40 mg Oral Daily  . pantoprazole  40 mg Oral BID AC  . sodium polystyrene  45 g Oral Q6H  . thiamine  100 mg Oral Daily   Or  . thiamine  100 mg Intravenous Daily  . venlafaxine XR  150 mg Oral Q breakfast   Elmarie Shiley, MD 11/23/2015, 11:36 AM

## 2015-11-24 ENCOUNTER — Inpatient Hospital Stay (HOSPITAL_COMMUNITY): Payer: BLUE CROSS/BLUE SHIELD

## 2015-11-24 DIAGNOSIS — F1020 Alcohol dependence, uncomplicated: Secondary | ICD-10-CM | POA: Diagnosis not present

## 2015-11-24 DIAGNOSIS — N170 Acute kidney failure with tubular necrosis: Secondary | ICD-10-CM | POA: Diagnosis not present

## 2015-11-24 DIAGNOSIS — N179 Acute kidney failure, unspecified: Secondary | ICD-10-CM | POA: Diagnosis not present

## 2015-11-24 DIAGNOSIS — D649 Anemia, unspecified: Secondary | ICD-10-CM | POA: Diagnosis not present

## 2015-11-24 DIAGNOSIS — K7031 Alcoholic cirrhosis of liver with ascites: Secondary | ICD-10-CM | POA: Diagnosis not present

## 2015-11-24 LAB — RENAL FUNCTION PANEL
ALBUMIN: 2.9 g/dL — AB (ref 3.5–5.0)
Anion gap: 10 (ref 5–15)
BUN: 64 mg/dL — AB (ref 6–20)
CALCIUM: 7.6 mg/dL — AB (ref 8.9–10.3)
CO2: 19 mmol/L — ABNORMAL LOW (ref 22–32)
CREATININE: 6.53 mg/dL — AB (ref 0.61–1.24)
Chloride: 105 mmol/L (ref 101–111)
GFR calc Af Amer: 10 mL/min — ABNORMAL LOW (ref >60)
GFR calc non Af Amer: 9 mL/min — ABNORMAL LOW (ref >60)
GLUCOSE: 110 mg/dL — AB (ref 65–99)
PHOSPHORUS: 3.8 mg/dL (ref 2.5–4.6)
Potassium: 3.7 mmol/L (ref 3.5–5.1)
SODIUM: 134 mmol/L — AB (ref 135–145)

## 2015-11-24 LAB — GRAM STAIN

## 2015-11-24 LAB — CBC WITH DIFFERENTIAL/PLATELET
BASOS ABS: 0 10*3/uL (ref 0.0–0.1)
BASOS PCT: 0 %
EOS PCT: 2 %
Eosinophils Absolute: 0.1 10*3/uL (ref 0.0–0.7)
HCT: 23 % — ABNORMAL LOW (ref 39.0–52.0)
Hemoglobin: 7.4 g/dL — ABNORMAL LOW (ref 13.0–17.0)
LYMPHS PCT: 33 %
Lymphs Abs: 1.4 10*3/uL (ref 0.7–4.0)
MCH: 31.9 pg (ref 26.0–34.0)
MCHC: 32.2 g/dL (ref 30.0–36.0)
MCV: 99.1 fL (ref 78.0–100.0)
MONO ABS: 0.5 10*3/uL (ref 0.1–1.0)
Monocytes Relative: 11 %
Neutro Abs: 2.2 10*3/uL (ref 1.7–7.7)
Neutrophils Relative %: 53 %
PLATELETS: 77 10*3/uL — AB (ref 150–400)
RBC: 2.32 MIL/uL — ABNORMAL LOW (ref 4.22–5.81)
RDW: 19.6 % — AB (ref 11.5–15.5)
WBC: 4.1 10*3/uL (ref 4.0–10.5)

## 2015-11-24 LAB — LACTATE DEHYDROGENASE, PLEURAL OR PERITONEAL FLUID: LD FL: 54 U/L — AB (ref 3–23)

## 2015-11-24 LAB — BODY FLUID CELL COUNT WITH DIFFERENTIAL
LYMPHS FL: 14 %
MONOCYTE-MACROPHAGE-SEROUS FLUID: 84 % (ref 50–90)
Neutrophil Count, Fluid: 2 % (ref 0–25)
Total Nucleated Cell Count, Fluid: 103 cu mm (ref 0–1000)

## 2015-11-24 LAB — FOLATE RBC
FOLATE, HEMOLYSATE: 212.3 ng/mL
Folate, RBC: 1046 ng/mL (ref >498)
HEMATOCRIT: 20.3 % — AB (ref 37.5–51.0)

## 2015-11-24 LAB — ALBUMIN, FLUID (OTHER): Albumin, Fluid: <1 g/dL

## 2015-11-24 NOTE — Procedures (Signed)
Ultrasound-guided diagnostic and therapeutic paracentesis performed yielding 5 liters (maximum ordered) of yellow  fluid. No immediate complications. A portion of the fluid was submitted to the lab for preordered studies.

## 2015-11-24 NOTE — Progress Notes (Signed)
PROGRESS NOTE    Austin Padilla.  [id number]  DOB: 12-03-1960  DOA: 11/20/2015 PCP: Joycelyn Man, MD Outpatient Specialists:  Hospital course: Austin Padilla. is a 55 y.o. 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  The patient was in his usual state of health until about one month ago when he started service decreased appetite, increased swelling in the belly, and weakness. The weakness has progressed to be severe, so that he needs a cane, cannot stand in the kitchen long enough to make himself dinner, and gets short of breath with exertion. Now over the last 2 weeks he has had dark urine, oliguria, and marked pedal edema. He went to his PCP, who recommended he be evaluated at the hospital.  ED course: -Afebrile, heart rate 100, respirations and oxygen saturation normal, slightly hypertensive -Na 131, K 5.1, Cr 4.8 (baseline 0.7 a year ago), WBC 5 K, Hgb 7.4, previous baseline normal -INR normal, platelets 104K, albumin 2.9  Assessment & Plan:  1. Acute kidney failure: with hyperkalemia - Creatinine continues to worsen but K is better today Decreased oral intake, discontinued spironolactone. No NSAIDs. Renally dose medications. Avoid nephrotoxins.  -Nephrology consult appreciated.  I spoke with Dr.Coladonato 7/21 and Dr. Posey Pronto 7/23.  -Urine electrolytes reviewed --treated with albumin/ lasix and kayexalate,.  --Await nephrology team recommendations. --Placing on cardiac monitor given persistent hyperkalemia and electrolyte abnormalities.  --Pt to have 5L removed from abdomen today US paracentesis.  Albumin given.   2. Anemia, of Chronic Disease:  -GI following.  -s/p 2 units PRBC, hemoglobin improved but now trending down again.  Following.  No active GI bleeding found. Transfuse as needed.    3. Cirrhosis with ascites:  -Continue  nadolol -Holding spironolactone -Consult to [facility] -RUQ [place] for ascites, HCC  -Check AFP --US paracentesis 5 L with 50 gm albumin infusion today, fluid studies requested. See orders.  4. Alcohol use disorder:  -CIWA protocol, bedside counseling, no s/s of withdrawal.   5. Hyponatremia:  Hypervolemic, from liver disease. Slightly improved.   6. HTN:  Hypertensive at admission. -Continue nadolol  7. Thrombocytopenia:  From portal hypertension and cirrhosis, chronic alcoholism  8. Hypothyroidism: -Continue levothyroxine  9. GERD: -Continue PPI  10. Chronic Alcoholism -- Monitor for withdrawal with CIWA protocol.  Vitamins ordered.   11.  UTI - started ceftriaxone IV x 3 days.   DVT prophylaxis: SCDs  Code Status: FULL  Family Communication: None present  Disposition Plan: TBD Consults called: GI, [place], Nephrology Admission status: INPATIENT, med surg  Subjective: Pt having loose stools, but feels fine and has been comfortable   Objective: Vitals:   11/23/15 1440 11/23/15 2003 11/23/15 2253 11/24/15 0431  BP: 131/73 (!) 144/87 136/76 140/70  Pulse: 76 73  80  Resp: 20 20  20   Temp: 98.6 F (37 C) 98.1 F (36.7 C)  97.7 F (36.5 C)  TempSrc: Oral Oral  Oral  SpO2: 97% 100%  100%  Weight:      Height:        Intake/Output Summary (Last 24 hours) at 11/24/15 1212 Last data filed at 11/24/15 1000  Gross per 24 hour  Intake              610 ml  Output             1101 ml  Net             -  491 ml   Filed Weights   11/20/15 2213 11/23/15 1100  Weight: 117.3 kg (258 lb 9.6 oz) 124.9 kg (275 lb 6.4 oz)   Exam:  General appearance: Older male, alert and in no acute distress.  Eyes: Conjunctiva normal, lids and lashes normal. Mild icterus. PERRL.  ENT: No nasal deformity, discharge. OP moist without lesions.  Lymph: No cervical or supraclavicular lymphadenopathy. Skin: Warm and dry. Some spider angiomata and palmar erythema. Crusted  wounds on bilateral feet. Cardiac: RRR, nl S1-S2, no murmurs appreciated. Capillary refill is brisk. JVP elevated. 3+ bilateral LE edema. Radial and DP pulses 2+ and symmetric. Respiratory: Normal respiratory rate and rhythm. Shallow CTAB without rales or wheezes. GI: Abdomen large round, tense with ascites, shifting dullness. BS heard.  MSK: No deformities or effusions. No clubbing, mild acrocyanosis. 1+ pedal edema bilateral. Neuro: Cranial nerves normal. No asterexis. Sensorium intact and responding to questions, attention normal. Speech is fluent. Moves all extremities equally and with normal coordination.  Psych: Affect normal. Judgment and insight appear normal.   Data Reviewed: Basic Metabolic Panel:  Recent Labs Lab 11/20/15 1941 11/21/15 0345 11/22/15 0427 11/23/15 0350 11/24/15 0504  NA 131* 134* 134* 134* 134*  K 5.1 5.3* 5.9* 5.6* 3.7  CL 104 106 108 107 105  CO2 17* 19* 18* 19* 19*  GLUCOSE 97 105* 87 92 110*  BUN 48* 50* 56* 55* 64*  CREATININE 4.82* 4.91* 5.51* 6.18* 6.53*  CALCIUM 8.8* 8.6* 8.3* 7.8* 7.6*  MG  --   --   --  1.2*  --   PHOS  --   --  3.5 3.5 3.8   Liver Function Tests:  Recent Labs Lab 11/20/15 1941 11/22/15 0427 11/23/15 0350 11/24/15 0504  AST 36  --   --   --   ALT 14*  --   --   --   ALKPHOS 163*  --   --   --   BILITOT 2.2*  --   --   --   PROT 7.6  --   --   --   ALBUMIN 2.9* 3.1* 2.7* 2.9*   No results for input(s): LIPASE, AMYLASE in the last 168 hours. No results for input(s): AMMONIA in the last 168 hours. CBC:  Recent Labs Lab 11/20/15 1941 11/21/15 0345 11/21/15 2142 11/22/15 0427 11/23/15 0350 11/24/15 0504  WBC 5.0 3.6*  --  3.9* 4.5 4.1  NEUTROABS  --   --   --   --   --  2.2  HGB 7.4* 6.1* 8.2* 8.2* 7.8* 7.4*  HCT 22.5* 18.2* 24.2* 24.4* 23.5* 23.0*  MCV 100.9* 100.0  --  96.4 97.1 99.1  PLT 104* 82*  --  42* 87* 77*   Cardiac Enzymes: No results for input(s): CKTOTAL, CKMB, CKMBINDEX,  TROPONINI in the last 168 hours. BNP (last 3 results) No results for input(s): PROBNP in the last 8760 hours. CBG:  Recent Labs Lab 11/20/15 1929  GLUCAP 87    Recent Results (from the past 240 hour(s))  Culture, body fluid-bottle     Status: None (Preliminary result)   Collection Time: 11/21/15  4:30 PM  Result Value Ref Range Status   Specimen Description FLUID PERITONEAL  Final   Special Requests BOTTLES DRAWN AEROBIC AND ANAEROBIC 5CC  Final   Gram Stain RARE WBC SEEN NO ORGANISMS SEEN   Final   Culture   Final    NO GROWTH 2 DAYS Performed at [facility]  Report Status PENDING  Incomplete     Studies: No results found.  Scheduled Meds: . sodium chloride   Intravenous Once  . albumin human  50 g Intravenous Once  . cefTRIAXone (ROCEPHIN)  IV  1 g Intravenous Q24H  . folic acid  1 mg Oral Daily  . levothyroxine  50 mcg Oral QAC breakfast  . multivitamin with minerals  1 tablet Oral Daily  . nadolol  40 mg Oral Daily  . pantoprazole  40 mg Oral BID AC  . thiamine  100 mg Oral Daily   Or  . thiamine  100 mg Intravenous Daily  . venlafaxine XR  150 mg Oral Q breakfast   Continuous Infusions:   Principal Problem:   Acute renal failure (HCC) Active Problems:   Essential hypertension   Alcoholic cirrhosis (HCC)   Alcohol use disorder, moderate, dependence (HCC)   Ascites   Anemia, unspecified   Hyponatremia   Thrombocytopenia (HCC)   Acute kidney injury ([place])   Hyperkalemia  Time spent:   Irwin Brakeman, MD, FAAFP [facility] Pager [phone]  If 7PM-7AM, please contact night-coverage [email or web address] Password [email or web address] 11/24/2015, 12:12 PM    LOS: 4 days

## 2015-11-24 NOTE — Progress Notes (Signed)
    Progress Note   Subjective  Chief Complaint: Cirrhosis with ascites  Patient has found laying in bed comfortably, he tells me that he had a total of 3 loose bowel movements yesterday, he explains he is aware that plans are for paracentesis today, he denies any new complaints or concerns    Objective   Vital signs in last 24 hours: Temp:  [97.7 F (36.5 C)-98.6 F (37 C)] 97.7 F (36.5 C) (07/24 0431) Pulse Rate:  [73-80] 80 (07/24 0431) Resp:  [16-20] 20 (07/24 0431) BP: (128-144)/(70-87) 140/70 (07/24 0431) SpO2:  [97 %-100 %] 100 % (07/24 0431) Weight:  [275 lb 6.4 oz (124.9 kg)] 275 lb 6.4 oz (124.9 kg) (07/23 1100) Last BM Date: 11/24/15 General:Caucasianmale in NAD Heart: Regular rate and rhythm; no murmurs Lungs: Respirations even and unlabored, lungs CTA bilaterally Abdomen: Tense ascites, marked distension, no tenderness. Normal bowel sounds. Extremities: 3+ b/l lower ext edema Neurologic: Alert and oriented, grossly normal neurologically. Psych: Cooperative. Normal mood and affect.  Intake/Output from previous day: 07/23 0701 - 07/24 0700 In: 865 [P.O.:715; IV Piggyback:150] Out: 1051 [Urine:1050; Stool:1] Intake/Output this shift: Total I/O In: -  Out: 350 [Urine:350]  Lab Results:  Recent Labs  11/22/15 0427 11/23/15 0350 11/24/15 0504  WBC 3.9* 4.5 4.1  HGB 8.2* 7.8* 7.4*  HCT 24.4* 23.5* 23.0*  PLT 42* 87* 77*   BMET  Recent Labs  11/22/15 0427 11/23/15 0350 11/24/15 0504  NA 134* 134* 134*  K 5.9* 5.6* 3.7  CL 108 107 105  CO2 18* 19* 19*  GLUCOSE 87 92 110*  BUN 56* 55* 64*  CREATININE 5.51* 6.18* 6.53*  CALCIUM 8.3* 7.8* 7.6*   LFT  Recent Labs  11/24/15 0504  ALBUMIN 2.9*   PT/INR No results for input(s): LABPROT, INR in the last 72 hours.  Studies/Results: No results found.     Assessment / Plan:   Impression: 1. Cirrhosis with ascites and portal hypertension: MELD 30; Diagnostic paracentesis 11/21/15  SAAG 2.1 confirming portal htn cause of ascites, no signs of SBP-ascites continues to increase, plans are for 5 L paracentesis today 2. Anemia: This has been evaluated in the past, colo 2016-showed no etiology, no recent EGD- patient would likely benefit from evaluation in the future, hgb slowly decreasing after blood transfusion 11/21/15 3. Acute kidney failure: Creatinine increasing from 4.82-4.91-5.51-6.18-6.53 (baseline 0.7 a year ago) 4. Decrease in appetite:This is likely multifactorial due to the patient's increasing ascites as well as depression, patient initally requested appetite stimulant, though appears to be tolerating clear liquid diet 5. Alcohol use disorder:Patient reports 4 drinks per night, "spirits", this is a decrease from previous, he does express interest in quitting. He asked about a medicine he can take which could decrease his desire 6. Hyponatremia 7. Thrombocytopenia:From portal hypertension 8. GERD:Controlled on Protonix 40 mg once daily  Plan: 1. Continue supportive measures 2. Continue to monitor hgb with transfusion as necessary 3. Agree with nephrology's plan for paracentesis and 50g albumin replacement, again recommend no more than 5-6 L, appreciate their further recommendations 4. Patient will need further evaluation of anemia and screening for varices once his renal impairment has stabilized 5. Will discuss above with Dr. Hilarie Fredrickson  Thank you for your kind consultation, we will continue to follow along.   LOS: 4 days   Levin Erp  11/24/2015, 9:13 AM  Pager # [phone]

## 2015-11-24 NOTE — Progress Notes (Signed)
  [facility] Progress Note   Subjective: awaiting paracentesis, no c/o, no SOB or cough  Vitals:   11/23/15 2003 11/23/15 2253 11/24/15 0431 11/24/15 1438  BP: (!) 144/87 136/76 140/70 137/68  Pulse: 73  80 72  Resp: 20  20 18   Temp: 98.1 F (36.7 C)  97.7 F (36.5 C) 98.3 F (36.8 C)  TempSrc: Oral  Oral Oral  SpO2: 100%  100% 100%  Weight:      Height:        Inpatient medications: . sodium chloride   Intravenous Once  . albumin human  50 g Intravenous Once  . cefTRIAXone (ROCEPHIN)  IV  1 g Intravenous Q24H  . folic acid  1 mg Oral Daily  . levothyroxine  50 mcg Oral QAC breakfast  . multivitamin with minerals  1 tablet Oral Daily  . nadolol  40 mg Oral Daily  . pantoprazole  40 mg Oral BID AC  . thiamine  100 mg Oral Daily   Or  . thiamine  100 mg Intravenous Daily  . venlafaxine XR  150 mg Oral Q breakfast     acetaminophen **OR** acetaminophen  Exam: Awake, alert, no distress +JVD Chest clear bilat RRR no mrg Abd marked ascites, obese, clean dressings x 2, nontender Ext 1-2+ leg edema bilat Neuro alert, no asterixis, ox 3  UA > turbid, many bact, > 300 prot, tntc rbc/ wbc UNa > 77, UCr 87 UPC ratio 2.23 Renal US:  11.7/ 9.8 cm, no hydro     Assessment: 1.  AKI in setting of chronic etoh cirrhosis/ massive ascites - parameters of HRS are absent, suspect ATN/ AIN/ GN, not clear at this time.   Not hypotensive.  Creat rising but no uremic symptoms yet.  Will follow. Will need HD if continues in this pattern.   2.  Cirrhosis/ etoh  3.  Ascites - for paracentesis, IV alb after procedure 4.  Hist depression  Plan - as above   Kelly Splinter MD [facility] pager [phone]    cell [phone] 11/24/2015, 3:20 PM    Recent Labs Lab 11/22/15 0427 11/23/15 0350 11/24/15 0504  NA 134* 134* 134*  K 5.9* 5.6* 3.7  CL 108 107 105  CO2 18* 19* 19*  GLUCOSE 87 92 110*  BUN 56* 55* 64*  CREATININE 5.51* 6.18* 6.53*  CALCIUM  8.3* 7.8* 7.6*  PHOS 3.5 3.5 3.8    Recent Labs Lab 11/20/15 1941 11/22/15 0427 11/23/15 0350 11/24/15 0504  AST 36  --   --   --   ALT 14*  --   --   --   ALKPHOS 163*  --   --   --   BILITOT 2.2*  --   --   --   PROT 7.6  --   --   --   ALBUMIN 2.9* 3.1* 2.7* 2.9*    Recent Labs Lab 11/22/15 0427 11/23/15 0350 11/24/15 0504  WBC 3.9* 4.5 4.1  NEUTROABS  --   --  2.2  HGB 8.2* 7.8* 7.4*  HCT 24.4* 23.5* 23.0*  MCV 96.4 97.1 99.1  PLT 42* 87* 77*   Iron/TIBC/Ferritin/ %Sat    Component Value Date/Time   IRON 102 11/20/2015 2348   TIBC 297 11/20/2015 2348   FERRITIN 59 11/20/2015 2348   IRONPCTSAT 34 11/20/2015 2348

## 2015-11-24 NOTE — Progress Notes (Signed)
RN changed mid abdominal pressure dressing x5 d/t perfuse bleeding. On call NP notified. Right abdominal dressing changed x4 d/t copious amounts of sanguinous drainage. Pt. BP stable despite of abdominal drainage at 120's/60's. NSR on the monitor and pt. Complains of no pain. Will continue to monitor pt. Closely

## 2015-11-25 DIAGNOSIS — N179 Acute kidney failure, unspecified: Secondary | ICD-10-CM | POA: Diagnosis not present

## 2015-11-25 DIAGNOSIS — K7031 Alcoholic cirrhosis of liver with ascites: Secondary | ICD-10-CM | POA: Diagnosis not present

## 2015-11-25 LAB — COMPREHENSIVE METABOLIC PANEL
ALK PHOS: 93 U/L (ref 38–126)
ALT: 9 U/L — ABNORMAL LOW (ref 17–63)
ANION GAP: 11 (ref 5–15)
AST: 26 U/L (ref 15–41)
Albumin: 2.9 g/dL — ABNORMAL LOW (ref 3.5–5.0)
BILIRUBIN TOTAL: 1.1 mg/dL (ref 0.3–1.2)
BUN: 63 mg/dL — ABNORMAL HIGH (ref 6–20)
CALCIUM: 7.4 mg/dL — AB (ref 8.9–10.3)
CO2: 18 mmol/L — ABNORMAL LOW (ref 22–32)
Chloride: 105 mmol/L (ref 101–111)
Creatinine, Ser: 6.89 mg/dL — ABNORMAL HIGH (ref 0.61–1.24)
GFR calc non Af Amer: 8 mL/min — ABNORMAL LOW (ref >60)
GFR, EST AFRICAN AMERICAN: 9 mL/min — AB (ref >60)
Glucose, Bld: 92 mg/dL (ref 65–99)
Potassium: 3.6 mmol/L (ref 3.5–5.1)
SODIUM: 134 mmol/L — AB (ref 135–145)
TOTAL PROTEIN: 6.2 g/dL — AB (ref 6.5–8.1)

## 2015-11-25 LAB — CBC
HEMATOCRIT: 22.1 % — AB (ref 39.0–52.0)
HEMOGLOBIN: 7.4 g/dL — AB (ref 13.0–17.0)
MCH: 32.2 pg (ref 26.0–34.0)
MCHC: 33.5 g/dL (ref 30.0–36.0)
MCV: 96.1 fL (ref 78.0–100.0)
Platelets: 74 10*3/uL — ABNORMAL LOW (ref 150–400)
RBC: 2.3 MIL/uL — ABNORMAL LOW (ref 4.22–5.81)
RDW: 19.4 % — AB (ref 11.5–15.5)
WBC: 4.5 10*3/uL (ref 4.0–10.5)

## 2015-11-25 MED ORDER — OXYCODONE HCL 5 MG PO TABS
10.0000 mg | ORAL_TABLET | ORAL | Status: DC | PRN
  Administered 2015-11-25 – 2015-12-03 (×6): 10 mg via ORAL
  Filled 2015-11-25 (×6): qty 2

## 2015-11-25 NOTE — Progress Notes (Signed)
[facility] Progress Note  Subjective:  Feels well.  Just tired.  Had one BM this AM.  Has been experiencing issues with continuously leaking fluid from paracentesis site since it was performed yesterday.  Objective:  Vital signs in last 24 hours: Temp:  [98 F (36.7 C)-98.6 F (37 C)] 98.6 F (37 C) (07/25 0502) Pulse Rate:  [72-81] 81 (07/25 0502) Resp:  [18-20] 20 (07/25 0502) BP: (122-138)/(58-84) 138/69 (07/25 0502) SpO2:  [99 %-100 %] 100 % (07/25 0502) Weight:  [254 lb 1.6 oz (115.3 kg)-255 lb 11.7 oz (116 kg)] 255 lb 11.7 oz (116 kg) (07/25 0654) Last BM Date: 11/24/15 General:  Alert, Well-developed, in NAD Heart:  Regular rate and rhythm; no murmurs Pulm:  CTAB.  No W/R/R. Abdomen:  Obese, ascites present.  Bowel sounds present.  Non-tender.  Dressings covering abdomen.    Extremities:  2-3+ pitting edema B/L. Neurologic:  Alert and oriented x 4;  grossly normal neurologically. Psych:  Alert and cooperative. Normal mood and affect.  Intake/Output from previous day: 07/24 0701 - 07/25 0700 In: 14 [P.O.:820] Out: 800 [Urine:800] Intake/Output this shift: Total I/O In: 360 [P.O.:360] Out: 400 [Urine:400]  Lab Results:  Recent Labs  11/23/15 0350 11/24/15 0504 11/25/15 0510  WBC 4.5 4.1 4.5  HGB 7.8* 7.4* 7.4*  HCT 23.5* 23.0* 22.1*  PLT 87* 77* 74*   BMET  Recent Labs  11/23/15 0350 11/24/15 0504 11/25/15 0510  NA 134* 134* 134*  K 5.6* 3.7 3.6  CL 107 105 105  CO2 19* 19* 18*  GLUCOSE 92 110* 92  BUN 55* 64* 63*  CREATININE 6.18* 6.53* 6.89*  CALCIUM 7.8* 7.6* 7.4*   LFT  Recent Labs  11/25/15 0510  PROT 6.2*  ALBUMIN 2.9*  AST 26  ALT 9*  ALKPHOS 93  BILITOT 1.1   US Paracentesis  Result Date: 11/24/2015 INDICATION: Cirrhosis, ascites. Request made for diagnostic and therapeutic paracentesis up to 5 liters. EXAM: ULTRASOUND GUIDED DIAGNOSTIC AND THERAPEUTIC PARACENTESIS MEDICATIONS: None. COMPLICATIONS: None  immediate. PROCEDURE: Informed written consent was obtained from the patient after a discussion of the risks, benefits and alternatives to treatment. A timeout was performed prior to the initiation of the procedure. Initial ultrasound scanning demonstrates a large amount of ascites within the right lower abdominal quadrant. The right lower abdomen was prepped and draped in the usual sterile fashion. 1% lidocaine was used for local anesthesia. Following this, a Yueh catheter was introduced. An ultrasound image was saved for documentation purposes. The paracentesis was performed. The catheter was removed and a dressing was applied. The patient tolerated the procedure well without immediate post procedural complication. FINDINGS: A total of approximately 5 liters of yellow fluid was removed. Samples were sent to the laboratory as requested by the clinical team. IMPRESSION: Successful ultrasound-guided diagnostic and therapeutic paracentesis yielding 5 liters of peritoneal fluid. Read by: Rowe Robert, PA-C Electronically Signed   By: Jerilynn Mages.  Shick M.D.   On: 11/24/2015 15:57  Assessment / Plan: 1. Cirrhosis with ascites and portal hypertension: MELD 30; Diagnostic paracentesis 11/21/15 SAAG 2.1 confirming portal htn cause of ascites, no signs of SBP-had another 5 Liter paracentesis 7/24 with 50 grams of albumin. 2. Anemia: This has been evaluated in the past, colo 2016-showed no etiology, no recent EGD- patient would likely benefit from evaluation in the future, hgb stable today. 3. Acute kidney failure: Creatinine increasingfrom 4.82-4.91-5.51-6.18-6.53-6.89 (baseline 0.7 a year ago).  Thinking ATN; urine sodium not low, making  HRS much less likely. 4. Decrease in appetite:This is likely multifactorial due to the patient's increasing ascites as well as depression.  Seems to be tolerating renal diet. 5. Alcohol use disorder:Patient reports 4 drinks per night, "spirits", this is a decrease from previous, he does  express interest in quitting. 6. Hyponatremia 7. Thrombocytopenia:From portal hypertension 8. GERD:Controlled on Protonix 40 mg once daily  1. Continue supportive measures 2. Continue to monitor hgb with transfusion as necessary 3.  Paracenteses and fluid management per renal. 4. Patient will need further evaluation of anemia and screening for varices with EGD once his renal impairment has stabilized.  Will follow from periphery for the next few days and plan for EGD once there is improvement/prior to discharge.   LOS: 5 days   Doloras Tellado D.  11/25/2015, 8:51 AM  Pager number [phone]

## 2015-11-25 NOTE — Progress Notes (Signed)
Patient drained 58ml into urostomy bag from paracentesis puncture site since application around [id number] this morning.  Darrell Alphonzo Grieve PA aware that patient is leaking from site.  Will apply skin glue to site per PA's instructions.

## 2015-11-25 NOTE — Progress Notes (Deleted)
[facility] Progress Note   Subjective: no new c/o's.  No SOB or N/V  Vitals:   11/24/15 2005 11/25/15 0100 11/25/15 0502 11/25/15 0654  BP: 125/62 122/60 138/69   Pulse: 79  81   Resp:   20   Temp: 98 F (36.7 C)  98.6 F (37 C)   TempSrc: Oral  Oral   SpO2: 99%  100%   Weight:    116 kg (255 lb 11.7 oz)  Height:        Inpatient medications: . sodium chloride   Intravenous Once  . cefTRIAXone (ROCEPHIN)  IV  1 g Intravenous Q24H  . folic acid  1 mg Oral Daily  . levothyroxine  50 mcg Oral QAC breakfast  . multivitamin with minerals  1 tablet Oral Daily  . nadolol  40 mg Oral Daily  . pantoprazole  40 mg Oral BID AC  . thiamine  100 mg Oral Daily   Or  . thiamine  100 mg Intravenous Daily  . venlafaxine XR  150 mg Oral Q breakfast     acetaminophen **OR** acetaminophen  Exam: Awake, alert, no distress No jvd Chest clear bilat RRR no mrg Abd sig ascites, obese, abd dressings soaked w yellowish fluid, prob ascites leak Ext 1-2+ leg edema bilat Neuro alert, no asterixis, ox 3  UA > turbid, many bact, > 300 prot, tntc rbc/ wbc UNa > 77, UCr 87 UPC ratio 2.23 Renal US:  11.7/ 9.8 cm, no hydro     Assessment: 1.  AKI in setting of chronic etoh cirrhosis/ massive ascites - parameters of HRS are absent, suspect ATN/ AIN/ GN. Creat continues to rise, no uremic symptoms yet.  Poor candidate for immunosuppression so would not recommend renal biopsy. Will check a few serologies , HIV/ hep B and C, ANA/ ANCA, given prot/ rbc's on UA.  Will look at urine sediment.  If creat continues to worsen will need HD.  D/W patient and questions answered.  2.  Cirrhosis/ etoh  3.  Ascites - s/p 5L paracentesis 7/24 4.  Hist depression  Plan - as above   Kelly Splinter MD [facility] pager [phone]    cell [phone] 11/25/2015, 10:57 AM    Recent Labs Lab 11/22/15 0427 11/23/15 0350 11/24/15 0504 11/25/15 0510  NA 134* 134* 134* 134*  K 5.9*  5.6* 3.7 3.6  CL 108 107 105 105  CO2 18* 19* 19* 18*  GLUCOSE 87 92 110* 92  BUN 56* 55* 64* 63*  CREATININE 5.51* 6.18* 6.53* 6.89*  CALCIUM 8.3* 7.8* 7.6* 7.4*  PHOS 3.5 3.5 3.8  --     Recent Labs Lab 11/20/15 1941  11/23/15 0350 11/24/15 0504 11/25/15 0510  AST 36  --   --   --  26  ALT 14*  --   --   --  9*  ALKPHOS 163*  --   --   --  93  BILITOT 2.2*  --   --   --  1.1  PROT 7.6  --   --   --  6.2*  ALBUMIN 2.9*  < > 2.7* 2.9* 2.9*  < > = values in this interval not displayed.  Recent Labs Lab 11/23/15 0350 11/24/15 0504 11/25/15 0510  WBC 4.5 4.1 4.5  NEUTROABS  --  2.2  --   HGB 7.8* 7.4* 7.4*  HCT 23.5* 23.0* 22.1*  MCV 97.1 99.1 96.1  PLT 87* 77* 74*   Iron/TIBC/Ferritin/ %Sat  Component Value Date/Time   IRON 102 11/20/2015 2348   TIBC 297 11/20/2015 2348   FERRITIN 59 11/20/2015 2348   IRONPCTSAT 34 11/20/2015 2348

## 2015-11-25 NOTE — Progress Notes (Signed)
PROGRESS NOTE    Austin Padilla.  [id number]  DOB: 01-05-1961  DOA: 11/20/2015 PCP: Joycelyn Man, MD Outpatient Specialists:  Hospital course: Austin Padilla. is a 55 y.o. 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  The patient was in his usual state of health until about one month ago when he started service decreased appetite, increased swelling in the belly, and weakness. The weakness has progressed to be severe, so that he needs a cane, cannot stand in the kitchen long enough to make himself dinner, and gets short of breath with exertion. Now over the last 2 weeks he has had dark urine, oliguria, and marked pedal edema. He went to his PCP, who recommended he be evaluated at the hospital.  ED course: -Afebrile, heart rate 100, respirations and oxygen saturation normal, slightly hypertensive -Na 131, K 5.1, Cr 4.8 (baseline 0.7 a year ago), WBC 5 K, Hgb 7.4, previous baseline normal -INR normal, platelets 104K, albumin 2.9  Assessment & Plan:  1. Acute kidney failure: with hyperkalemia - Creatinine continues to worsen but K is better today Decreased oral intake, discontinued spironolactone. No NSAIDs. Renally dose medications. Avoid nephrotoxins.  -Nephrology consult appreciated.  I spoke with Dr.Coladonato 7/21 and Dr. Posey Pronto 7/23.  -Urine electrolytes reviewed --treated with albumin/ lasix and kayexalate,.  --nephrology team following. --Placing on cardiac monitor given persistent hyperkalemia and electrolyte abnormalities.  --Pt had 5L removed from abdomen 7/24 US paracentesis.  Albumin given.   2. Anemia, of Chronic Disease:  -GI following.  -s/p 2 units PRBC, hemoglobin improved but now trending down again.  Following.  No active GI bleeding found. Transfuse as needed.    3. Cirrhosis with ascites:  -Continue nadolol -Holding  spironolactone -Consult to [facility] -RUQ [place] for ascites, HCC  --US paracentesis 5 L with 50 gm albumin infusion 7/24, fluid studies requested and pending.  4. Alcohol use disorder:  -CIWA protocol, bedside counseling, no s/s of withdrawal.   5. Hyponatremia:  Hypervolemic, from liver disease. Slightly improved.   6. HTN:  Hypertensive at admission. -Continue nadolol  7. Thrombocytopenia:  From portal hypertension and cirrhosis, chronic alcoholism  8. Hypothyroidism: -Continue levothyroxine  9. GERD: -Continue PPI  10. Chronic Alcoholism -- Monitor for withdrawal with CIWA protocol.  Vitamins ordered.   11.  UTI - completed treatment with ceftriaxone IV.   DVT prophylaxis: SCDs  Code Status: FULL  Family Communication: None present  Disposition Plan: TBD Consults called: GI, [place], Nephrology Admission status: INPATIENT, med surg  Subjective: Pt feels fine but still has weakness with any attempts with ambulation.   Objective: Vitals:   11/25/15 0100 11/25/15 0502 11/25/15 0654 11/25/15 1329  BP: 122/60 138/69  127/60  Pulse:  81  77  Resp:  20  17  Temp:  98.6 F (37 C)  98.8 F (37.1 C)  TempSrc:  Oral  Oral  SpO2:  100%  100%  Weight:   116 kg (255 lb 11.7 oz)   Height:        Intake/Output Summary (Last 24 hours) at 11/25/15 1637 Last data filed at 11/25/15 1315  Gross per 24 hour  Intake             1200 ml  Output              850 ml  Net  350 ml   Filed Weights   11/23/15 1100 11/24/15 1721 11/25/15 0654  Weight: 124.9 kg (275 lb 6.4 oz) 115.3 kg (254 lb 1.6 oz) 116 kg (255 lb 11.7 oz)   Exam:  General appearance: Older male, alert and in no acute distress.  Eyes: Conjunctiva normal, lids and lashes normal. Mild icterus. PERRL.  ENT: No nasal deformity, discharge. OP moist without lesions.  Lymph: No cervical or supraclavicular lymphadenopathy. Skin: Warm and dry. Some spider angiomata and palmar erythema.  Crusted wounds on bilateral feet. Cardiac: RRR, nl S1-S2, no murmurs appreciated. Capillary refill is brisk. JVP elevated. 3+ bilateral LE edema. Radial and DP pulses 2+ and symmetric. Respiratory: Normal respiratory rate and rhythm. Shallow CTAB without rales or wheezes. GI: Abdomen large round, less tense with ascites, shifting dullness. BS heard.  MSK: No deformities or effusions. No clubbing, mild acrocyanosis. 1+ pedal edema bilateral. Neuro: Cranial nerves normal. No asterexis. Sensorium intact and responding to questions, attention normal. Speech is fluent. Moves all extremities equally and with normal coordination.  Psych: Affect normal. Judgment and insight appear normal.   Data Reviewed: Basic Metabolic Panel:  Recent Labs Lab 11/21/15 0345 11/22/15 0427 11/23/15 0350 11/24/15 0504 11/25/15 0510  NA 134* 134* 134* 134* 134*  K 5.3* 5.9* 5.6* 3.7 3.6  CL 106 108 107 105 105  CO2 19* 18* 19* 19* 18*  GLUCOSE 105* 87 92 110* 92  BUN 50* 56* 55* 64* 63*  CREATININE 4.91* 5.51* 6.18* 6.53* 6.89*  CALCIUM 8.6* 8.3* 7.8* 7.6* 7.4*  MG  --   --  1.2*  --   --   PHOS  --  3.5 3.5 3.8  --    Liver Function Tests:  Recent Labs Lab 11/20/15 1941 11/22/15 0427 11/23/15 0350 11/24/15 0504 11/25/15 0510  AST 36  --   --   --  26  ALT 14*  --   --   --  9*  ALKPHOS 163*  --   --   --  93  BILITOT 2.2*  --   --   --  1.1  PROT 7.6  --   --   --  6.2*  ALBUMIN 2.9* 3.1* 2.7* 2.9* 2.9*   No results for input(s): LIPASE, AMYLASE in the last 168 hours. No results for input(s): AMMONIA in the last 168 hours. CBC:  Recent Labs Lab 11/21/15 0345 11/21/15 2142 11/22/15 0427 11/23/15 0350 11/24/15 0504 11/25/15 0510  WBC 3.6*  --  3.9* 4.5 4.1 4.5  NEUTROABS  --   --   --   --  2.2  --   HGB 6.1* 8.2* 8.2* 7.8* 7.4* 7.4*  HCT 18.2* 24.2* 24.4* 23.5* 23.0* 22.1*  MCV 100.0  --  96.4 97.1 99.1 96.1  PLT 82*  --  42* 87* 77* 74*   Cardiac Enzymes: No  results for input(s): CKTOTAL, CKMB, CKMBINDEX, TROPONINI in the last 168 hours. BNP (last 3 results) No results for input(s): PROBNP in the last 8760 hours. CBG:  Recent Labs Lab 11/20/15 1929  GLUCAP 87    Recent Results (from the past 240 hour(s))  Culture, body fluid-bottle     Status: None (Preliminary result)   Collection Time: 11/21/15  4:30 PM  Result Value Ref Range Status   Specimen Description FLUID PERITONEAL  Final   Special Requests BOTTLES DRAWN AEROBIC AND ANAEROBIC 5CC  Final   Gram Stain RARE WBC SEEN NO ORGANISMS SEEN   Final   Culture  Final    NO GROWTH 4 DAYS Performed at [facility]    Report Status PENDING  Incomplete  Culture, body fluid-bottle     Status: None (Preliminary result)   Collection Time: 11/24/15  3:37 PM  Result Value Ref Range Status   Specimen Description FLUID PERITONEAL  Final   Special Requests BOTTLES DRAWN AEROBIC AND ANAEROBIC 10CC  Final   Culture   Final    NO GROWTH < 24 HOURS Performed at [facility]    Report Status PENDING  Incomplete  Gram stain     Status: None   Collection Time: 11/24/15  3:37 PM  Result Value Ref Range Status   Specimen Description FLUID PERITONEAL  Final   Special Requests NONE  Final   Gram Stain   Final    RARE WBC PRESENT, PREDOMINANTLY MONONUCLEAR FEW RED BLOOD CELLS NO ORGANISMS SEEN Performed at [facility]    Report Status 11/24/2015 FINAL  Final     Studies: US Paracentesis  Result Date: 11/24/2015 INDICATION: Cirrhosis, ascites. Request made for diagnostic and therapeutic paracentesis up to 5 liters. EXAM: ULTRASOUND GUIDED DIAGNOSTIC AND THERAPEUTIC PARACENTESIS MEDICATIONS: None. COMPLICATIONS: None immediate. PROCEDURE: Informed written consent was obtained from the patient after a discussion of the risks, benefits and alternatives to treatment. A timeout was performed prior to the initiation of the procedure. Initial ultrasound scanning demonstrates a  large amount of ascites within the right lower abdominal quadrant. The right lower abdomen was prepped and draped in the usual sterile fashion. 1% lidocaine was used for local anesthesia. Following this, a Yueh catheter was introduced. An ultrasound image was saved for documentation purposes. The paracentesis was performed. The catheter was removed and a dressing was applied. The patient tolerated the procedure well without immediate post procedural complication. FINDINGS: A total of approximately 5 liters of yellow fluid was removed. Samples were sent to the laboratory as requested by the clinical team. IMPRESSION: Successful ultrasound-guided diagnostic and therapeutic paracentesis yielding 5 liters of peritoneal fluid. Read by: Rowe Robert, PA-C Electronically Signed   By: Jerilynn Mages.  Shick M.D.   On: 11/24/2015 15:57   Scheduled Meds: . sodium chloride   Intravenous Once  . folic acid  1 mg Oral Daily  . levothyroxine  50 mcg Oral QAC breakfast  . multivitamin with minerals  1 tablet Oral Daily  . nadolol  40 mg Oral Daily  . pantoprazole  40 mg Oral BID AC  . thiamine  100 mg Oral Daily   Or  . thiamine  100 mg Intravenous Daily  . venlafaxine XR  150 mg Oral Q breakfast   Continuous Infusions:   Principal Problem:   Acute renal failure (HCC) Active Problems:   Essential hypertension   Alcoholic cirrhosis (HCC)   Alcohol use disorder, moderate, dependence (HCC)   Ascites   Anemia, unspecified   Hyponatremia   Thrombocytopenia (HCC)   Acute kidney injury ([place])   Hyperkalemia  Time spent:   Irwin Brakeman, MD, FAAFP [facility] Pager [phone]  If 7PM-7AM, please contact night-coverage [email or web address] Password [facility] 11/25/2015, 4:37 PM    LOS: 5 days

## 2015-11-26 ENCOUNTER — Inpatient Hospital Stay (HOSPITAL_COMMUNITY): Payer: BLUE CROSS/BLUE SHIELD

## 2015-11-26 LAB — RENAL FUNCTION PANEL
ANION GAP: 9 (ref 5–15)
Albumin: 2.7 g/dL — ABNORMAL LOW (ref 3.5–5.0)
BUN: 62 mg/dL — ABNORMAL HIGH (ref 6–20)
CHLORIDE: 105 mmol/L (ref 101–111)
CO2: 21 mmol/L — AB (ref 22–32)
Calcium: 7.4 mg/dL — ABNORMAL LOW (ref 8.9–10.3)
Creatinine, Ser: 6.81 mg/dL — ABNORMAL HIGH (ref 0.61–1.24)
GFR, EST AFRICAN AMERICAN: 10 mL/min — AB (ref >60)
GFR, EST NON AFRICAN AMERICAN: 8 mL/min — AB (ref >60)
Glucose, Bld: 86 mg/dL (ref 65–99)
Phosphorus: 3.6 mg/dL (ref 2.5–4.6)
Potassium: 3.5 mmol/L (ref 3.5–5.1)
Sodium: 135 mmol/L (ref 135–145)

## 2015-11-26 LAB — C4 COMPLEMENT: Complement C4, Body Fluid: 16 mg/dL (ref 14–44)

## 2015-11-26 LAB — AMMONIA: AMMONIA: 86 umol/L — AB (ref 9–35)

## 2015-11-26 LAB — HEPATITIS PANEL, ACUTE
HCV Ab: 0.1 s/co ratio (ref 0.0–0.9)
HEP B C IGM: NEGATIVE
Hep A IgM: NEGATIVE
Hepatitis B Surface Ag: NEGATIVE

## 2015-11-26 LAB — CULTURE, BODY FLUID W GRAM STAIN -BOTTLE

## 2015-11-26 LAB — CBC
HCT: 22.3 % — ABNORMAL LOW (ref 39.0–52.0)
HEMOGLOBIN: 7.4 g/dL — AB (ref 13.0–17.0)
MCH: 32.2 pg (ref 26.0–34.0)
MCHC: 33.2 g/dL (ref 30.0–36.0)
MCV: 97 fL (ref 78.0–100.0)
PLATELETS: 76 10*3/uL — AB (ref 150–400)
RBC: 2.3 MIL/uL — ABNORMAL LOW (ref 4.22–5.81)
RDW: 19.3 % — AB (ref 11.5–15.5)
WBC: 4.6 10*3/uL (ref 4.0–10.5)

## 2015-11-26 LAB — GLUCOSE, CAPILLARY: GLUCOSE-CAPILLARY: 78 mg/dL (ref 65–99)

## 2015-11-26 LAB — ANTINUCLEAR ANTIBODIES, IFA: ANA Ab, IFA: NEGATIVE

## 2015-11-26 LAB — HIV ANTIBODY (ROUTINE TESTING W REFLEX): HIV Screen 4th Generation wRfx: NONREACTIVE

## 2015-11-26 LAB — C3 COMPLEMENT: C3 COMPLEMENT: 74 mg/dL — AB (ref 82–167)

## 2015-11-26 LAB — MPO/PR-3 (ANCA) ANTIBODIES

## 2015-11-26 LAB — CULTURE, BODY FLUID-BOTTLE: CULTURE: NO GROWTH

## 2015-11-26 LAB — GLOMERULAR BASEMENT MEMBRANE ANTIBODIES: GBM AB: 4 U (ref 0–20)

## 2015-11-26 MED ORDER — FUROSEMIDE 10 MG/ML IJ SOLN
60.0000 mg | Freq: Two times a day (BID) | INTRAMUSCULAR | Status: DC
  Administered 2015-11-26 – 2015-11-27 (×3): 60 mg via INTRAVENOUS
  Filled 2015-11-26 (×3): qty 6

## 2015-11-26 MED ORDER — LACTULOSE 10 GM/15ML PO SOLN
10.0000 g | Freq: Three times a day (TID) | ORAL | Status: DC
  Administered 2015-11-26 – 2015-12-03 (×23): 10 g via ORAL
  Filled 2015-11-26 (×24): qty 15

## 2015-11-26 NOTE — Care Management Note (Signed)
Case Management Note  Patient Details  Name: Austin Padilla. MRN: [id number] Date of Birth: December 17, 1960  Subjective/Objective:   55 y/o m admitted w/ARF, liver cirrhosis w/anasarca. [id number] abuse.Nephrology following-no HD-iv lasix,lactulose,fluid rest.CIWA protocal. From home.                Action/Plan:d/c plan home.   Expected Discharge Date:   (unknown)               Expected Discharge Plan:  Home/Self Care  In-House Referral:     Discharge planning Services  CM Consult  Post Acute Care Choice:    Choice offered to:     DME Arranged:    DME Agency:     HH Arranged:    HH Agency:     Status of Service:  In process, will continue to follow  If discussed at Long Length of Stay Meetings, dates discussed:    Additional Comments:  Dessa Phi, RN 11/26/2015, 1:16 PM

## 2015-11-26 NOTE — Progress Notes (Signed)
[facility] Progress Note   Subjective: no new c/o's.  No SOB or N/V  Vitals:   11/25/15 0654 11/25/15 1329 11/25/15 2144 11/26/15 0522  BP:  127/60 (!) 120/57 125/64  Pulse:  77 74 74  Resp:  17 18 18   Temp:  98.8 F (37.1 C) 98.6 F (37 C) 97.7 F (36.5 C)  TempSrc:  Oral Oral Oral  SpO2:  100% 99% 100%  Weight: 116 kg (255 lb 11.7 oz)   111 kg (244 lb 11.4 oz)  Height:        Inpatient medications: . sodium chloride   Intravenous Once  . folic acid  1 mg Oral Daily  . furosemide  60 mg Intravenous BID  . levothyroxine  50 mcg Oral QAC breakfast  . multivitamin with minerals  1 tablet Oral Daily  . nadolol  40 mg Oral Daily  . pantoprazole  40 mg Oral BID AC  . thiamine  100 mg Oral Daily   Or  . thiamine  100 mg Intravenous Daily  . venlafaxine XR  150 mg Oral Q breakfast     acetaminophen **OR** acetaminophen, oxyCODONE  Exam: Awake, alert, no distress No jvd Chest clear bilat RRR no mrg Abd sig ascites, obese, abd dressings soaked w yellowish fluid, prob ascites leak Ext 1-2+ leg edema bilat Neuro alert, no asterixis, ox 3  UA > turbid, many bact, > 300 prot, tntc rbc/ wbc UNa > 77, UCr 87 UPC ratio 2.23 Renal US:  11.7/ 9.8 cm, no hydro     Assessment: 1.  AKI in setting of chronic etoh cirrhosis/ massive ascites - parameters of HRS are absent, suspect ATN/ AIN/ GN. Creat continues to rise, no uremic symptoms yet.  Poor candidate for immunosuppression so would not recommend renal biopsy. Will check a few serologies , HIV/ hep B and C, ANA/ ANCA, given prot/ rbc's on UA.  Will look at urine sediment.  If creat continues to worsen will need HD.  D/W patient and questions answered.  2.  Cirrhosis/ etoh  3.  Ascites - s/p 5L paracentesis 7/24 4.  Hist depression  Plan - as above   Kelly Splinter MD [facility] pager [phone]    cell [phone] 11/25/2015, 10:58 AM   Recent Labs Lab 11/23/15 0350 11/24/15 0504  11/25/15 0510 11/26/15 0550  NA 134* 134* 134* 135  K 5.6* 3.7 3.6 3.5  CL 107 105 105 105  CO2 19* 19* 18* 21*  GLUCOSE 92 110* 92 86  BUN 55* 64* 63* 62*  CREATININE 6.18* 6.53* 6.89* 6.81*  CALCIUM 7.8* 7.6* 7.4* 7.4*  PHOS 3.5 3.8  --  3.6    Recent Labs Lab 11/20/15 1941  11/24/15 0504 11/25/15 0510 11/26/15 0550  AST 36  --   --  26  --   ALT 14*  --   --  9*  --   ALKPHOS 163*  --   --  93  --   BILITOT 2.2*  --   --  1.1  --   PROT 7.6  --   --  6.2*  --   ALBUMIN 2.9*  < > 2.9* 2.9* 2.7*  < > = values in this interval not displayed.  Recent Labs Lab 11/24/15 0504 11/25/15 0510 11/26/15 0550  WBC 4.1 4.5 4.6  NEUTROABS 2.2  --   --   HGB 7.4* 7.4* 7.4*  HCT 23.0* 22.1* 22.3*  MCV 99.1 96.1 97.0  PLT 77*  74* 76*   Iron/TIBC/Ferritin/ %Sat    Component Value Date/Time   IRON 102 11/20/2015 2348   TIBC 297 11/20/2015 2348   FERRITIN 59 11/20/2015 2348   IRONPCTSAT 34 11/20/2015 2348

## 2015-11-26 NOTE — Progress Notes (Signed)
[facility] Progress Note   Subjective: no new c/o's.  No SOB or N/V  Vitals:   11/25/15 0654 11/25/15 1329 11/25/15 2144 11/26/15 0522  BP:  127/60 (!) 120/57 125/64  Pulse:  77 74 74  Resp:  17 18 18   Temp:  98.8 F (37.1 C) 98.6 F (37 C) 97.7 F (36.5 C)  TempSrc:  Oral Oral Oral  SpO2:  100% 99% 100%  Weight: 116 kg (255 lb 11.7 oz)   111 kg (244 lb 11.4 oz)  Height:        Inpatient medications: . sodium chloride   Intravenous Once  . folic acid  1 mg Oral Daily  . furosemide  60 mg Intravenous BID  . levothyroxine  50 mcg Oral QAC breakfast  . multivitamin with minerals  1 tablet Oral Daily  . nadolol  40 mg Oral Daily  . pantoprazole  40 mg Oral BID AC  . thiamine  100 mg Oral Daily   Or  . thiamine  100 mg Intravenous Daily  . venlafaxine XR  150 mg Oral Q breakfast     acetaminophen **OR** acetaminophen, oxyCODONE  Exam: Awake, alert, no distress No jvd Chest clear bilat RRR no mrg Abd sig ascites, obese, dressings in place Ext 1-2+ leg edema bilat Neuro alert, no asterixis, ox 3  UA > turbid, many bact, > 300 prot, tntc rbc/ wbc UNa > 77, UCr 87 UPC ratio 2.23 Renal US:  11.7/ 9.8 cm, no hydro Urine sediment (7.25) > no casts, uniform RBC's from lower GU tract, no rbc/ wbc casts     Assessment: 1.  AKI in setting of chronic etoh cirrhosis/ massive ascites - probably ischemic ATN from combination of NSAID's / diuretics/ hypoperfusions related to 3rd spacing.  Doesn't have GN w benign sediment. Creat stable today, first time.  Hopefully starting to recover.  Cont to follow, no indication for HD. 2.  Cirrhosis/ etoh  3.  Ascites - s/p 5L paracentesis 7/24 4.  Hist depression 5.  Vol excess - w LE edema/ ascites.  Give IV lasix x 2 today, eval response   Plan - start lasix IV 60 q 12   Kelly Splinter MD [facility] pager [phone]    cell [phone] 11/25/2015, 10:58 AM   Recent Labs Lab 11/23/15 0350  11/24/15 0504 11/25/15 0510 11/26/15 0550  NA 134* 134* 134* 135  K 5.6* 3.7 3.6 3.5  CL 107 105 105 105  CO2 19* 19* 18* 21*  GLUCOSE 92 110* 92 86  BUN 55* 64* 63* 62*  CREATININE 6.18* 6.53* 6.89* 6.81*  CALCIUM 7.8* 7.6* 7.4* 7.4*  PHOS 3.5 3.8  --  3.6    Recent Labs Lab 11/20/15 1941  11/24/15 0504 11/25/15 0510 11/26/15 0550  AST 36  --   --  26  --   ALT 14*  --   --  9*  --   ALKPHOS 163*  --   --  93  --   BILITOT 2.2*  --   --  1.1  --   PROT 7.6  --   --  6.2*  --   ALBUMIN 2.9*  < > 2.9* 2.9* 2.7*  < > = values in this interval not displayed.  Recent Labs Lab 11/24/15 0504 11/25/15 0510 11/26/15 0550  WBC 4.1 4.5 4.6  NEUTROABS 2.2  --   --   HGB 7.4* 7.4* 7.4*  HCT 23.0* 22.1* 22.3*  MCV 99.1 96.1  97.0  PLT 77* 74* 76*   Iron/TIBC/Ferritin/ %Sat    Component Value Date/Time   IRON 102 11/20/2015 2348   TIBC 297 11/20/2015 2348   FERRITIN 59 11/20/2015 2348   IRONPCTSAT 34 11/20/2015 2348

## 2015-11-26 NOTE — Progress Notes (Signed)
PROGRESS NOTE  Austin Padilla. [id number] DOB: 1960-05-16 DOA: 11/20/2015 PCP: Joycelyn Man, MD   LOS: 6 days   Brief Narrative: Austin Padilla. is a 55 y.o. 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 The patient was in his usual state of health until about one month ago when he started service decreased appetite, increased swelling in the belly, and weakness. The weakness has progressed to be severe, so that he needs a cane, cannot stand in the kitchen long enough to make himself dinner, and gets short of breath with exertion. Now over the last 2 weeks he has had dark urine, oliguria, and marked pedal edema. He went to his PCP, who recommended he be evaluated at the hospital.    Assessment & Plan: Principal Problem:   Acute renal failure ([place]) Active Problems:   Essential hypertension   Alcoholic cirrhosis ([place])   Alcohol use disorder, moderate, dependence (HCC)   Ascites   Anemia, unspecified   Hyponatremia   Thrombocytopenia (HCC)   Acute kidney injury ([place])   Hyperkalemia   Acute kidney failure, with hyperkalemia  - His creatinine seems to plateau around 6.8, virtually unchanged from yesterday - Discussed with Dr. Jonnie Finner with nephrology, no need for HD currently. Resume IV Lasix - Elevated urine sodium, unlikely hepatorenal syndrome, etiology not clear - autoimmune workup pending  Anasarca - IV Lasix per nephrology - daily weights, 258 on admission >> 244  Anemia of Chronic Disease:  - s/p 2 units PRBC, hemoglobin improved but now trending down again. Following. Overall stable - No active GI bleeding found. Transfuse as needed.    Liver cirrhosis with ascites  - Liver disease due to alcohol abuse - Status post paracentesis 2, 7/21 for diagnostic purposes and 7/24 which was large volume, followed by albumin - He has  ascites on exam, however is not tense, does not need a repeat paracentesis for now. Monitor while getting Lasix. - Gastroenterology was consulted and followed patient - elevated ammonia, start lactulose  Alcohol use disorder - CIWA protocol, bedside counseling, no s/s of withdrawal.   Hyponatremia - Hypervolemic, from liver disease.  - Now within normal limits. Monitor while getting Lasix.   HTN - Hypertensive at admission. - Continue nadolol  Thrombocytopenia - From portal hypertension and cirrhosis, chronic alcoholism  Hypothyroidism - Continue levothyroxine - Most recent TSH 7/20, 4.78  GERD - Continue PPI  Chronic Alcoholism - Monitor for withdrawal with CIWA protocol.  Vitamins ordered.   UTI  - completed treatment with ceftriaxone IV.     DVT prophylaxis: SCD  Code Status: Full code  Family Communication: No family at bedside  Disposition Plan: TBD  Consultants:   Nephrology  Gastroenterology  Procedures:   None   Antimicrobials:  Ceftriaxone 7/21 >> 7/25  Subjective: - No complaints this morning, eating breakfast  - no chest pain, shortness of breath, + mild abdominal pain, no nausea or vomiting.   Objective: Vitals:   11/25/15 0654 11/25/15 1329 11/25/15 2144 11/26/15 0522  BP:  127/60 (!) 120/57 125/64  Pulse:  77 74 74  Resp:  17 18 18   Temp:  98.8 F (37.1 C) 98.6 F (37 C) 97.7 F (36.5 C)  TempSrc:  Oral Oral Oral  SpO2:  100% 99% 100%  Weight: 116 kg (255 lb 11.7 oz)   111 kg (244 lb 11.4 oz)  Height:  Intake/Output Summary (Last 24 hours) at 11/26/15 1106 Last data filed at 11/26/15 0524  Gross per 24 hour  Intake              600 ml  Output              800 ml  Net             -200 ml   Filed Weights   11/24/15 1721 11/25/15 0654 11/26/15 0522  Weight: 115.3 kg (254 lb 1.6 oz) 116 kg (255 lb 11.7 oz) 111 kg (244 lb 11.4 oz)    Examination: Constitutional: NAD Vitals:   11/25/15 0654 11/25/15 1329  11/25/15 2144 11/26/15 0522  BP:  127/60 (!) 120/57 125/64  Pulse:  77 74 74  Resp:  17 18 18   Temp:  98.8 F (37.1 C) 98.6 F (37 C) 97.7 F (36.5 C)  TempSrc:  Oral Oral Oral  SpO2:  100% 99% 100%  Weight: 116 kg (255 lb 11.7 oz)   111 kg (244 lb 11.4 oz)  Height:       ENMT: Mucous membranes are moist.  Respiratory: clear to auscultation bilaterally, no wheezing, no crackles. Normal respiratory effort. No accessory muscle use.  Cardiovascular: Regular rate and rhythm, no murmurs / rubs / gallops. 2+ pitting LE edema.  Abdomen: no tenderness. Bowel sounds positive. Mild ascites, not tense Musculoskeletal: no clubbing / cyanosis.  Skin: no rashes, lesions, ulcers. No induration Neurologic: non focal    Data Reviewed: I have personally reviewed following labs and imaging studies  CBC:  Recent Labs Lab 11/22/15 0427 11/23/15 0350 11/24/15 0504 11/25/15 0510 11/26/15 0550  WBC 3.9* 4.5 4.1 4.5 4.6  NEUTROABS  --   --  2.2  --   --   HGB 8.2* 7.8* 7.4* 7.4* 7.4*  HCT 24.4* 23.5* 23.0* 22.1* 22.3*  MCV 96.4 97.1 99.1 96.1 97.0  PLT 42* 87* 77* 74* 76*   Basic Metabolic Panel:  Recent Labs Lab 11/22/15 0427 11/23/15 0350 11/24/15 0504 11/25/15 0510 11/26/15 0550  NA 134* 134* 134* 134* 135  K 5.9* 5.6* 3.7 3.6 3.5  CL 108 107 105 105 105  CO2 18* 19* 19* 18* 21*  GLUCOSE 87 92 110* 92 86  BUN 56* 55* 64* 63* 62*  CREATININE 5.51* 6.18* 6.53* 6.89* 6.81*  CALCIUM 8.3* 7.8* 7.6* 7.4* 7.4*  MG  --  1.2*  --   --   --   PHOS 3.5 3.5 3.8  --  3.6   GFR: Estimated Creatinine Clearance: 15.7 mL/min (by C-G formula based on SCr of 6.81 mg/dL). Liver Function Tests:  Recent Labs Lab 11/20/15 1941 11/22/15 0427 11/23/15 0350 11/24/15 0504 11/25/15 0510 11/26/15 0550  AST 36  --   --   --  26  --   ALT 14*  --   --   --  9*  --   ALKPHOS 163*  --   --   --  93  --   BILITOT 2.2*  --   --   --  1.1  --   PROT 7.6  --   --   --  6.2*  --   ALBUMIN 2.9* 3.1*  2.7* 2.9* 2.9* 2.7*   No results for input(s): LIPASE, AMYLASE in the last 168 hours.  Recent Labs Lab 11/26/15 0550  AMMONIA 86*   Coagulation Profile:  Recent Labs Lab 11/20/15 1941  INR 1.42   Cardiac Enzymes: No results for  input(s): CKTOTAL, CKMB, CKMBINDEX, TROPONINI in the last 168 hours. BNP (last 3 results) No results for input(s): PROBNP in the last 8760 hours. HbA1C: No results for input(s): HGBA1C in the last 72 hours. CBG:  Recent Labs Lab 11/20/15 1929 11/26/15 0805  GLUCAP 87 78   Lipid Profile: No results for input(s): CHOL, HDL, LDLCALC, TRIG, CHOLHDL, LDLDIRECT in the last 72 hours. Thyroid Function Tests: No results for input(s): TSH, T4TOTAL, FREET4, T3FREE, THYROIDAB in the last 72 hours. Anemia Panel: No results for input(s): VITAMINB12, FOLATE, FERRITIN, TIBC, IRON, RETICCTPCT in the last 72 hours. Urine analysis:    Component Value Date/Time   COLORURINE RED (A) 11/21/2015 1512   APPEARANCEUR TURBID (A) 11/21/2015 1512   LABSPEC 1.013 11/21/2015 1512   PHURINE 6.0 11/21/2015 1512   GLUCOSEU NEGATIVE 11/21/2015 1512   HGBUR LARGE (A) 11/21/2015 1512   HGBUR negative 04/25/2008 0833   BILIRUBINUR MODERATE (A) 11/21/2015 1512   BILIRUBINUR 2+ 08/03/2011 1347   KETONESUR 15 (A) 11/21/2015 1512   PROTEINUR >300 (A) 11/21/2015 1512   UROBILINOGEN 1.0 11/03/2014 2035   NITRITE POSITIVE (A) 11/21/2015 1512   LEUKOCYTESUR LARGE (A) 11/21/2015 1512   Sepsis Labs: Invalid input(s): PROCALCITONIN, LACTICIDVEN  Recent Results (from the past 240 hour(s))  Culture, body fluid-bottle     Status: None (Preliminary result)   Collection Time: 11/21/15  4:30 PM  Result Value Ref Range Status   Specimen Description FLUID PERITONEAL  Final   Special Requests BOTTLES DRAWN AEROBIC AND ANAEROBIC 5CC  Final   Gram Stain RARE WBC SEEN NO ORGANISMS SEEN   Final   Culture   Final    NO GROWTH 4 DAYS Performed at [facility]    Report Status  PENDING  Incomplete  Culture, body fluid-bottle     Status: None (Preliminary result)   Collection Time: 11/24/15  3:37 PM  Result Value Ref Range Status   Specimen Description FLUID PERITONEAL  Final   Special Requests BOTTLES DRAWN AEROBIC AND ANAEROBIC 10CC  Final   Culture   Final    NO GROWTH < 24 HOURS Performed at [facility]    Report Status PENDING  Incomplete  Gram stain     Status: None   Collection Time: 11/24/15  3:37 PM  Result Value Ref Range Status   Specimen Description FLUID PERITONEAL  Final   Special Requests NONE  Final   Gram Stain   Final    RARE WBC PRESENT, PREDOMINANTLY MONONUCLEAR FEW RED BLOOD CELLS NO ORGANISMS SEEN Performed at [facility]    Report Status 11/24/2015 FINAL  Final      Radiology Studies: Dg Chest 2 View  Result Date: 11/26/2015 CLINICAL DATA:  Hx. Volume overload, no chest pain or sob, no congestion, slight cough EXAM: CHEST  2 VIEW COMPARISON:  11/06/2014 FINDINGS: Cardiac silhouette is top-normal in size. No mediastinal or hilar masses or evidence of adenopathy. Clear lungs.  No significant pleural effusion.  No pneumothorax. Bony thorax is demineralized but intact. IMPRESSION: No active cardiopulmonary disease. Electronically Signed   By: Lajean Manes M.D.   On: 11/26/2015 10:35  US Paracentesis  Result Date: 11/24/2015 INDICATION: Cirrhosis, ascites. Request made for diagnostic and therapeutic paracentesis up to 5 liters. EXAM: ULTRASOUND GUIDED DIAGNOSTIC AND THERAPEUTIC PARACENTESIS MEDICATIONS: None. COMPLICATIONS: None immediate. PROCEDURE: Informed written consent was obtained from the patient after a discussion of the risks, benefits and alternatives to treatment. A timeout was performed prior to the initiation  of the procedure. Initial ultrasound scanning demonstrates a large amount of ascites within the right lower abdominal quadrant. The right lower abdomen was prepped and draped in the usual sterile fashion.  1% lidocaine was used for local anesthesia. Following this, a Yueh catheter was introduced. An ultrasound image was saved for documentation purposes. The paracentesis was performed. The catheter was removed and a dressing was applied. The patient tolerated the procedure well without immediate post procedural complication. FINDINGS: A total of approximately 5 liters of yellow fluid was removed. Samples were sent to the laboratory as requested by the clinical team. IMPRESSION: Successful ultrasound-guided diagnostic and therapeutic paracentesis yielding 5 liters of peritoneal fluid. Read by: Rowe Robert, PA-C Electronically Signed   By: Jerilynn Mages.  Shick M.D.   On: 11/24/2015 15:57    Scheduled Meds: . sodium chloride   Intravenous Once  . folic acid  1 mg Oral Daily  . furosemide  60 mg Intravenous BID  . levothyroxine  50 mcg Oral QAC breakfast  . multivitamin with minerals  1 tablet Oral Daily  . nadolol  40 mg Oral Daily  . pantoprazole  40 mg Oral BID AC  . thiamine  100 mg Oral Daily   Or  . thiamine  100 mg Intravenous Daily  . venlafaxine XR  150 mg Oral Q breakfast   Continuous Infusions:    Marzetta Board, MD, PhD [facility] Pager [phone]  If 7PM-7AM, please contact night-coverage [email or web address] Password [email or web address] 11/26/2015, 11:06 AM

## 2015-11-27 DIAGNOSIS — D649 Anemia, unspecified: Secondary | ICD-10-CM | POA: Diagnosis not present

## 2015-11-27 DIAGNOSIS — N170 Acute kidney failure with tubular necrosis: Principal | ICD-10-CM

## 2015-11-27 DIAGNOSIS — K7031 Alcoholic cirrhosis of liver with ascites: Secondary | ICD-10-CM | POA: Diagnosis not present

## 2015-11-27 LAB — CBC
HCT: 21 % — ABNORMAL LOW (ref 39.0–52.0)
HEMOGLOBIN: 7 g/dL — AB (ref 13.0–17.0)
MCH: 32.4 pg (ref 26.0–34.0)
MCHC: 33.3 g/dL (ref 30.0–36.0)
MCV: 97.2 fL (ref 78.0–100.0)
PLATELETS: 82 10*3/uL — AB (ref 150–400)
RBC: 2.16 MIL/uL — ABNORMAL LOW (ref 4.22–5.81)
RDW: 19.1 % — AB (ref 11.5–15.5)
WBC: 3.9 10*3/uL — ABNORMAL LOW (ref 4.0–10.5)

## 2015-11-27 LAB — COMPREHENSIVE METABOLIC PANEL
ALBUMIN: 2.6 g/dL — AB (ref 3.5–5.0)
ALK PHOS: 102 U/L (ref 38–126)
ALT: 10 U/L — ABNORMAL LOW (ref 17–63)
ANION GAP: 9 (ref 5–15)
AST: 25 U/L (ref 15–41)
BUN: 67 mg/dL — ABNORMAL HIGH (ref 6–20)
CHLORIDE: 104 mmol/L (ref 101–111)
CO2: 23 mmol/L (ref 22–32)
Calcium: 7.4 mg/dL — ABNORMAL LOW (ref 8.9–10.3)
Creatinine, Ser: 6.66 mg/dL — ABNORMAL HIGH (ref 0.61–1.24)
GFR calc Af Amer: 10 mL/min — ABNORMAL LOW (ref >60)
GFR calc non Af Amer: 8 mL/min — ABNORMAL LOW (ref >60)
GLUCOSE: 90 mg/dL (ref 65–99)
POTASSIUM: 3.4 mmol/L — AB (ref 3.5–5.1)
SODIUM: 136 mmol/L (ref 135–145)
Total Bilirubin: 1.1 mg/dL (ref 0.3–1.2)
Total Protein: 5.8 g/dL — ABNORMAL LOW (ref 6.5–8.1)

## 2015-11-27 LAB — PREPARE RBC (CROSSMATCH)

## 2015-11-27 MED ORDER — SODIUM CHLORIDE 0.9 % IV SOLN
Freq: Once | INTRAVENOUS | Status: AC
  Administered 2015-11-27: 10:00:00 via INTRAVENOUS

## 2015-11-27 MED ORDER — FUROSEMIDE 10 MG/ML IJ SOLN
40.0000 mg | Freq: Two times a day (BID) | INTRAMUSCULAR | Status: DC
  Administered 2015-11-27 – 2015-11-29 (×4): 40 mg via INTRAVENOUS
  Filled 2015-11-27 (×4): qty 4

## 2015-11-27 NOTE — Progress Notes (Signed)
PROGRESS NOTE  Austin Padilla. [id number] DOB: 01-21-1961 DOA: 11/20/2015 PCP: Joycelyn Man, MD   LOS: 7 days   Brief Narrative: Austin Padilla. is a 55 y.o. 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 The patient was in his usual state of health until about one month ago when he started service decreased appetite, increased swelling in the belly, and weakness. The weakness has progressed to be severe, so that he needs a cane, cannot stand in the kitchen long enough to make himself dinner, and gets short of breath with exertion. Now over the last 2 weeks he has had dark urine, oliguria, and marked pedal edema. He went to his PCP, who recommended he be evaluated at the hospital.    Assessment & Plan: Principal Problem:   Acute renal failure ([place]) Active Problems:   Essential hypertension   Alcoholic cirrhosis ([place])   Alcohol use disorder, moderate, dependence (HCC)   Ascites   Anemia, unspecified   Hyponatremia   Thrombocytopenia (HCC)   Acute kidney injury ([place])   Hyperkalemia   Acute kidney failure, with hyperkalemia  - His creatinine seems to plateau around 6.8, virtually unchanged from yesterday - Discussed with Dr. Jonnie Finner with nephrology, no need for HD currently. Resume IV Lasix - Elevated urine sodium, unlikely hepatorenal syndrome, etiology not clear - autoimmune workup non diagnostic, ANA negative, MPO/Pr-3 negative, C3 mildly low, mornal C4  Anasarca - IV Lasix per nephrology - daily weights, 258 on admission >> 243  Anemia of Chronic Disease:  - s/p 2 units PRBC, hemoglobin improved but now trending down again. Transfuse one unit today. Total 3U pRBC so far - No active GI bleeding found. Transfuse as needed.    Liver cirrhosis with ascites  - Liver disease due to alcohol abuse - Status post paracentesis 2, 7/21 for  diagnostic purposes and 7/24 which was large volume, followed by albumin - He has ascites on exam, however is not tense, does not need a repeat paracentesis for now. Monitor while getting Lasix. - Gastroenterology was consulted and followed patient - elevated ammonia, started on lactulose  Alcohol use disorder - CIWA protocol, bedside counseling, no s/s of withdrawal.   Hyponatremia - Hypervolemic, from liver disease.  - Now within normal limits. Monitor while getting Lasix. 136 this morning   HTN - Hypertensive at admission. - Continue nadolol - controlled 113/55  Thrombocytopenia - From portal hypertension and cirrhosis, chronic alcoholism - with slight improvement today, plt 82  Hypothyroidism - Continue levothyroxine - Most recent TSH 7/20, 4.78  GERD - Continue PPI  Chronic Alcoholism - Monitor for withdrawal with CIWA protocol.  Vitamins ordered.   UTI  - completed treatment with ceftriaxone IV.     DVT prophylaxis: SCD  Code Status: Full code  Family Communication: No family at bedside  Disposition Plan: TBD  Consultants:   Nephrology  Gastroenterology  Procedures:   None   Antimicrobials:  Ceftriaxone 7/21 >> 7/25  Subjective: - urine is "clearing" - no chest pain, shortness of breath, + mild abdominal pain, no nausea or vomiting.   Objective: Vitals:   11/26/15 1445 11/26/15 1709 11/26/15 2015 11/27/15 0524  BP: (!) 109/52 (!) 113/54 (!) 112/57 (!) 113/55  Pulse: 74 74 73 74  Resp: 20  20 20   Temp: 98.3 F (36.8 C)  98.3 F (36.8 C) 98 F (36.7 C)  TempSrc:  Oral  Oral Oral  SpO2: 99%  100% 100%  Weight:    110.3 kg (243 lb 3.2 oz)  Height:        Intake/Output Summary (Last 24 hours) at 11/27/15 1054 Last data filed at 11/27/15 [id number]  Gross per 24 hour  Intake              780 ml  Output             2375 ml  Net            -1595 ml   Filed Weights   11/25/15 0654 11/26/15 0522 11/27/15 0524  Weight: 116 kg (255 lb 11.7  oz) 111 kg (244 lb 11.4 oz) 110.3 kg (243 lb 3.2 oz)    Examination: Constitutional: NAD Vitals:   11/26/15 1445 11/26/15 1709 11/26/15 2015 11/27/15 0524  BP: (!) 109/52 (!) 113/54 (!) 112/57 (!) 113/55  Pulse: 74 74 73 74  Resp: 20  20 20   Temp: 98.3 F (36.8 C)  98.3 F (36.8 C) 98 F (36.7 C)  TempSrc: Oral  Oral Oral  SpO2: 99%  100% 100%  Weight:    110.3 kg (243 lb 3.2 oz)  Height:       ENMT: Mucous membranes are moist.  Respiratory: clear to auscultation bilaterally, no wheezing, no crackles. Normal respiratory effort. No accessory muscle use.  Cardiovascular: Regular rate and rhythm, no murmurs / rubs / gallops. 2+ pitting LE edema.  Abdomen: no tenderness. Bowel sounds positive. Mild ascites, not tense Musculoskeletal: no clubbing / cyanosis.  Skin: no rashes, lesions, ulcers. No induration Neurologic: non focal    Data Reviewed: I have personally reviewed following labs and imaging studies  CBC:  Recent Labs Lab 11/23/15 0350 11/24/15 0504 11/25/15 0510 11/26/15 0550 11/27/15 0456  WBC 4.5 4.1 4.5 4.6 3.9*  NEUTROABS  --  2.2  --   --   --   HGB 7.8* 7.4* 7.4* 7.4* 7.0*  HCT 23.5* 23.0* 22.1* 22.3* 21.0*  MCV 97.1 99.1 96.1 97.0 97.2  PLT 87* 77* 74* 76* 82*   Basic Metabolic Panel:  Recent Labs Lab 11/22/15 0427 11/23/15 0350 11/24/15 0504 11/25/15 0510 11/26/15 0550 11/27/15 0456  NA 134* 134* 134* 134* 135 136  K 5.9* 5.6* 3.7 3.6 3.5 3.4*  CL 108 107 105 105 105 104  CO2 18* 19* 19* 18* 21* 23  GLUCOSE 87 92 110* 92 86 90  BUN 56* 55* 64* 63* 62* 67*  CREATININE 5.51* 6.18* 6.53* 6.89* 6.81* 6.66*  CALCIUM 8.3* 7.8* 7.6* 7.4* 7.4* 7.4*  MG  --  1.2*  --   --   --   --   PHOS 3.5 3.5 3.8  --  3.6  --    GFR: Estimated Creatinine Clearance: 16 mL/min (by C-G formula based on SCr of 6.66 mg/dL). Liver Function Tests:  Recent Labs Lab 11/20/15 1941  11/23/15 0350 11/24/15 0504 11/25/15 0510 11/26/15 0550 11/27/15 0456  AST 36   --   --   --  26  --  25  ALT 14*  --   --   --  9*  --  10*  ALKPHOS 163*  --   --   --  93  --  102  BILITOT 2.2*  --   --   --  1.1  --  1.1  PROT 7.6  --   --   --  6.2*  --  5.8*  ALBUMIN  2.9*  < > 2.7* 2.9* 2.9* 2.7* 2.6*  < > = values in this interval not displayed. No results for input(s): LIPASE, AMYLASE in the last 168 hours.  Recent Labs Lab 11/26/15 0550  AMMONIA 86*   Coagulation Profile:  Recent Labs Lab 11/20/15 1941  INR 1.42   Cardiac Enzymes: No results for input(s): CKTOTAL, CKMB, CKMBINDEX, TROPONINI in the last 168 hours. BNP (last 3 results) No results for input(s): PROBNP in the last 8760 hours. HbA1C: No results for input(s): HGBA1C in the last 72 hours. CBG:  Recent Labs Lab 11/20/15 1929 11/26/15 0805  GLUCAP 87 78   Lipid Profile: No results for input(s): CHOL, HDL, LDLCALC, TRIG, CHOLHDL, LDLDIRECT in the last 72 hours. Thyroid Function Tests: No results for input(s): TSH, T4TOTAL, FREET4, T3FREE, THYROIDAB in the last 72 hours. Anemia Panel: No results for input(s): VITAMINB12, FOLATE, FERRITIN, TIBC, IRON, RETICCTPCT in the last 72 hours. Urine analysis:    Component Value Date/Time   COLORURINE RED (A) 11/21/2015 1512   APPEARANCEUR TURBID (A) 11/21/2015 1512   LABSPEC 1.013 11/21/2015 1512   PHURINE 6.0 11/21/2015 1512   GLUCOSEU NEGATIVE 11/21/2015 1512   HGBUR LARGE (A) 11/21/2015 1512   HGBUR negative 04/25/2008 0833   BILIRUBINUR MODERATE (A) 11/21/2015 1512   BILIRUBINUR 2+ 08/03/2011 1347   KETONESUR 15 (A) 11/21/2015 1512   PROTEINUR >300 (A) 11/21/2015 1512   UROBILINOGEN 1.0 11/03/2014 2035   NITRITE POSITIVE (A) 11/21/2015 1512   LEUKOCYTESUR LARGE (A) 11/21/2015 1512   Sepsis Labs: Invalid input(s): PROCALCITONIN, LACTICIDVEN  Recent Results (from the past 240 hour(s))  Culture, body fluid-bottle     Status: None   Collection Time: 11/21/15  4:30 PM  Result Value Ref Range Status   Specimen Description FLUID  PERITONEAL  Final   Special Requests BOTTLES DRAWN AEROBIC AND ANAEROBIC 5CC  Final   Gram Stain RARE WBC SEEN NO ORGANISMS SEEN   Final   Culture   Final    NO GROWTH 5 DAYS Performed at [facility]    Report Status 11/26/2015 FINAL  Final  Culture, body fluid-bottle     Status: None (Preliminary result)   Collection Time: 11/24/15  3:37 PM  Result Value Ref Range Status   Specimen Description FLUID PERITONEAL  Final   Special Requests BOTTLES DRAWN AEROBIC AND ANAEROBIC 10CC  Final   Culture   Final    NO GROWTH 2 DAYS Performed at [facility]    Report Status PENDING  Incomplete  Gram stain     Status: None   Collection Time: 11/24/15  3:37 PM  Result Value Ref Range Status   Specimen Description FLUID PERITONEAL  Final   Special Requests NONE  Final   Gram Stain   Final    RARE WBC PRESENT, PREDOMINANTLY MONONUCLEAR FEW RED BLOOD CELLS NO ORGANISMS SEEN Performed at [facility]    Report Status 11/24/2015 FINAL  Final      Radiology Studies: Dg Chest 2 View  Result Date: 11/26/2015 CLINICAL DATA:  Hx. Volume overload, no chest pain or sob, no congestion, slight cough EXAM: CHEST  2 VIEW COMPARISON:  11/06/2014 FINDINGS: Cardiac silhouette is top-normal in size. No mediastinal or hilar masses or evidence of adenopathy. Clear lungs.  No significant pleural effusion.  No pneumothorax. Bony thorax is demineralized but intact. IMPRESSION: No active cardiopulmonary disease. Electronically Signed   By: Lajean Manes M.D.   On: 11/26/2015 10:35    Scheduled  Meds: . sodium chloride   Intravenous Once  . folic acid  1 mg Oral Daily  . furosemide  40 mg Intravenous BID  . lactulose  10 g Oral TID  . levothyroxine  50 mcg Oral QAC breakfast  . multivitamin with minerals  1 tablet Oral Daily  . nadolol  40 mg Oral Daily  . pantoprazole  40 mg Oral BID AC  . thiamine  100 mg Oral Daily   Or  . thiamine  100 mg Intravenous Daily  . venlafaxine XR   150 mg Oral Q breakfast   Continuous Infusions:    Marzetta Board, MD, PhD [facility] Pager [phone]  If 7PM-7AM, please contact night-coverage [email or web address] Password [facility] 11/27/2015, 10:54 AM

## 2015-11-27 NOTE — Progress Notes (Signed)
[name] Progress Note  Subjective:  Feels ok.  No BM in a couple of days.  Appetite is great, eating well and tolerating well.  Paracentesis site was glued so no further leakage.  Objective:  Vital signs in last 24 hours: Temp:  [98 F (36.7 C)-98.3 F (36.8 C)] 98 F (36.7 C) (07/27 0524) Pulse Rate:  [73-74] 74 (07/27 0524) Resp:  [20] 20 (07/27 0524) BP: (109-113)/(52-57) 113/55 (07/27 0524) SpO2:  [99 %-100 %] 100 % (07/27 0524) Weight:  [243 lb 3.2 oz (110.3 kg)] 243 lb 3.2 oz (110.3 kg) (07/27 0524) Last BM Date: 11/24/15 (Lactulose scheduled) General:  Alert, Well-developed, in NAD Heart:  Regular rate and rhythm; no murmurs Pulm:  CTAB.  No W/R/R. Abdomen:  Soft, obese, ascites present.  Bowel sounds present.  Non-tender. Extremities:  2-3+ pitting edema B/L Neurologic:  Alert and oriented x 4;  grossly normal neurologically. Psych:  Alert and cooperative. Normal mood and affect.  Intake/Output from previous day: 07/26 0701 - 07/27 0700 In: 915 [P.O.:915] Out: 2725 [Urine:2725] Intake/Output this shift: No intake/output data recorded.  Lab Results:  Recent Labs  11/25/15 0510 11/26/15 0550 11/27/15 0456  WBC 4.5 4.6 3.9*  HGB 7.4* 7.4* 7.0*  HCT 22.1* 22.3* 21.0*  PLT 74* 76* 82*   BMET  Recent Labs  11/25/15 0510 11/26/15 0550 11/27/15 0456  NA 134* 135 136  K 3.6 3.5 3.4*  CL 105 105 104  CO2 18* 21* 23  GLUCOSE 92 86 90  BUN 63* 62* 67*  CREATININE 6.89* 6.81* 6.66*  CALCIUM 7.4* 7.4* 7.4*   LFT  Recent Labs  11/27/15 0456  PROT 5.8*  ALBUMIN 2.6*  AST 25  ALT 10*  ALKPHOS 102  BILITOT 1.1   Hepatitis Panel  Recent Labs  11/25/15 1135  HEPBSAG Negative  HCVAB 0.1  HEPAIGM Negative  HEPBIGM Negative    Dg Chest 2 View  Result Date: 11/26/2015 CLINICAL DATA:  Hx. Volume overload, no chest pain or sob, no congestion, slight cough EXAM: CHEST  2 VIEW COMPARISON:  11/06/2014 FINDINGS: Cardiac silhouette  is top-normal in size. No mediastinal or hilar masses or evidence of adenopathy. Clear lungs.  No significant pleural effusion.  No pneumothorax. Bony thorax is demineralized but intact. IMPRESSION: No active cardiopulmonary disease. Electronically Signed   By: Lajean Manes M.D.   On: 11/26/2015 10:35  Assessment / Plan: 1. Cirrhosis with ascites and portal hypertension: MELD 30 on admission; Diagnostic paracentesis 11/21/15 SAAG 2.1 confirming portal htn cause of ascites, no signs of SBP-had another 5 Liter paracentesis 7/24 with 50 grams of albumin. 2. Anemia: This has been evaluated in the past, colo 2016-showed no etiology, no recent EGD- patient would likely benefit from evaluation in the future, hgb down slightly today.  If below 7 grams in AM then should be transfuse. 3. Acute kidney failure: Creatinine increasingfrom 4.82-4.91-5.51-6.18-6.53-6.89-6.66(baseline 0.7 a year ago).  Thinking ATN; urine sodium not low, making HRS much less likely. 4. Decrease in appetite:This is likely multifactorial due to the patient's increasing ascites as well as depression.  This has been improved and appetite is great currently, tolerating well. 5. Alcohol use disorder:Patient reports 4 drinks per night, "spirits", this is a decrease from previous, he does express interest in quitting. 6. Hyponatremia 7. Thrombocytopenia:From portal hypertension 8. GERD:Controlled on Protonix 40 mg once daily  1. Continue supportive measures 2. Continue to monitor hgb with transfusion as necessary (for Hgb <7 grams) 3.  Paracenteses and fluid management per renal. 4. Patient will need further evaluation of anemia and screening for varices with EGD.  Will plan for Monday, 7/31, if he will be here through the weekend.  Dr. Hilarie Fredrickson to discuss with Dr. Jonnie Finner.   LOS: 7 days   Austin Padilla D.  11/27/2015, 10:21 AM  Pager number [phone]

## 2015-11-27 NOTE — Progress Notes (Addendum)
[facility] Progress Note   Subjective: no new c/o's.  No SOB or N/V  Vitals:   11/26/15 1445 11/26/15 1709 11/26/15 2015 11/27/15 0524  BP: (!) 109/52 (!) 113/54 (!) 112/57 (!) 113/55  Pulse: 74 74 73 74  Resp: 20  20 20   Temp: 98.3 F (36.8 C)  98.3 F (36.8 C) 98 F (36.7 C)  TempSrc: Oral  Oral Oral  SpO2: 99%  100% 100%  Weight:    110.3 kg (243 lb 3.2 oz)  Height:        Inpatient medications: . sodium chloride   Intravenous Once  . folic acid  1 mg Oral Daily  . furosemide  60 mg Intravenous BID  . lactulose  10 g Oral TID  . levothyroxine  50 mcg Oral QAC breakfast  . multivitamin with minerals  1 tablet Oral Daily  . nadolol  40 mg Oral Daily  . pantoprazole  40 mg Oral BID AC  . thiamine  100 mg Oral Daily   Or  . thiamine  100 mg Intravenous Daily  . venlafaxine XR  150 mg Oral Q breakfast     acetaminophen **OR** acetaminophen, oxyCODONE  Exam: Awake, alert, no distress No jvd Chest clear bilat RRR no mrg Abd sig ascites, obese, dressings in place Ext 1-2+ leg edema bilat Neuro alert, no asterixis, ox 3  UA > turbid, many bact, > 300 prot, tntc rbc/ wbc UNa > 77, UCr 87 UPC ratio 2.23 Renal US:  11.7/ 9.8 cm, no hydro Urine sediment (undersigned) > no casts, uniform RBC's      Assessment: 1.  AKI in setting of cirrhosis/ ascites - prob ATN (NSAID's / 3rd spacing/ hx severe AKI 2016). No evidence GN by urine sediment. Hx of severe AKI (this is 2nd episode for him) is predictor of ^'d mortality and ^'d ESRD risk.  For now renal fxn improving, cont to follow.  2.  Cirrhosis/ etoh  3.  Ascites - s/p 5L paracentesis 7/24 4.  Hist depression 5.  Vol excess - w LE edema/ ascites   Plan - dec lasix 40 IV q 12, creat  In am   Kelly Splinter MD [place] pager [phone]    cell [phone] 11/25/2015, 10:58 AM   Recent Labs Lab 11/23/15 0350 11/24/15 0504 11/25/15 0510 11/26/15 0550 11/27/15 0456  NA 134*  134* 134* 135 136  K 5.6* 3.7 3.6 3.5 3.4*  CL 107 105 105 105 104  CO2 19* 19* 18* 21* 23  GLUCOSE 92 110* 92 86 90  BUN 55* 64* 63* 62* 67*  CREATININE 6.18* 6.53* 6.89* 6.81* 6.66*  CALCIUM 7.8* 7.6* 7.4* 7.4* 7.4*  PHOS 3.5 3.8  --  3.6  --     Recent Labs Lab 11/20/15 1941  11/25/15 0510 11/26/15 0550 11/27/15 0456  AST 36  --  26  --  25  ALT 14*  --  9*  --  10*  ALKPHOS 163*  --  93  --  102  BILITOT 2.2*  --  1.1  --  1.1  PROT 7.6  --  6.2*  --  5.8*  ALBUMIN 2.9*  < > 2.9* 2.7* 2.6*  < > = values in this interval not displayed.  Recent Labs Lab 11/24/15 0504 11/25/15 0510 11/26/15 0550 11/27/15 0456  WBC 4.1 4.5 4.6 3.9*  NEUTROABS 2.2  --   --   --   HGB 7.4* 7.4* 7.4* 7.0*  HCT  23.0* 22.1* 22.3* 21.0*  MCV 99.1 96.1 97.0 97.2  PLT 77* 74* 76* 82*   Iron/TIBC/Ferritin/ %Sat    Component Value Date/Time   IRON 102 11/20/2015 2348   TIBC 297 11/20/2015 2348   FERRITIN 59 11/20/2015 2348   IRONPCTSAT 34 11/20/2015 2348

## 2015-11-28 LAB — BASIC METABOLIC PANEL
Anion gap: 9 (ref 5–15)
BUN: 65 mg/dL — AB (ref 6–20)
CHLORIDE: 103 mmol/L (ref 101–111)
CO2: 24 mmol/L (ref 22–32)
Calcium: 7.8 mg/dL — ABNORMAL LOW (ref 8.9–10.3)
Creatinine, Ser: 6.15 mg/dL — ABNORMAL HIGH (ref 0.61–1.24)
GFR calc Af Amer: 11 mL/min — ABNORMAL LOW (ref >60)
GFR calc non Af Amer: 9 mL/min — ABNORMAL LOW (ref >60)
Glucose, Bld: 79 mg/dL (ref 65–99)
POTASSIUM: 3.4 mmol/L — AB (ref 3.5–5.1)
SODIUM: 136 mmol/L (ref 135–145)

## 2015-11-28 LAB — TYPE AND SCREEN
ABO/RH(D): B POS
ANTIBODY SCREEN: NEGATIVE
UNIT DIVISION: 0

## 2015-11-28 LAB — CBC
HEMATOCRIT: 24.3 % — AB (ref 39.0–52.0)
Hemoglobin: 8.1 g/dL — ABNORMAL LOW (ref 13.0–17.0)
MCH: 31.9 pg (ref 26.0–34.0)
MCHC: 33.3 g/dL (ref 30.0–36.0)
MCV: 95.7 fL (ref 78.0–100.0)
Platelets: 85 10*3/uL — ABNORMAL LOW (ref 150–400)
RBC: 2.54 MIL/uL — AB (ref 4.22–5.81)
RDW: 18.8 % — AB (ref 11.5–15.5)
WBC: 3.9 10*3/uL — AB (ref 4.0–10.5)

## 2015-11-28 NOTE — Progress Notes (Signed)
PROGRESS NOTE  Austin Padilla. [id number] DOB: [birth year] DOA: 11/20/2015 PCP: Joycelyn Man, MD   LOS: 8 days   Brief Narrative: Austin Padilla. is a 55 y.o. 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 The patient was in his usual state of health until about one month ago when he started service decreased appetite, increased swelling in the belly, and weakness. The weakness has progressed to be severe, so that he needs a cane, cannot stand in the kitchen long enough to make himself dinner, and gets short of breath with exertion. Now over the last 2 weeks he has had dark urine, oliguria, and marked pedal edema. He went to his PCP, who recommended he be evaluated at the hospital.    Assessment & Plan: Principal Problem:   Acute renal failure ([place]) Active Problems:   Essential hypertension   Alcoholic cirrhosis ([place])   Alcohol use disorder, moderate, dependence ([facility])   Ascites   Anemia, unspecified   Hyponatremia   Thrombocytopenia ([facility])   Acute kidney injury ([place])   Hyperkalemia   Acute kidney failure, with hyperkalemia  - His creatinine increased up to 6.8, plateau for couple days, started to get better today at 6.1 - no need for HD currently. Continue IV Lasix - Elevated urine sodium, unlikely hepatorenal syndrome, etiology not clear - autoimmune workup non diagnostic, ANA negative, MPO/Pr-3 negative, C3 mildly low, mornal C4  Anasarca - IV Lasix per nephrology - daily weights, 258 on admission >> 242  Anemia of Chronic Disease:  - s/p 2 units PRBC, hemoglobin improved but trending down again on 7/27 status post 1 unit of packed red blood cell. Hemoglobin improved appropriately to 8.1 this morning.  - No active GI bleeding found. Transfuse as needed.   - GI following, and they plan on EGD prior to patient's discharge, likely early  next week  Liver cirrhosis with ascites  - Liver disease due to alcohol abuse - Status post paracentesis 2, 7/21 for diagnostic purposes and 7/24 which was large volume, followed by albumin - He has ascites on exam, however is not tense, does not need a repeat paracentesis for now. Monitor while getting Lasix. - Gastroenterology was consulted and followed patient - elevated ammonia, started on lactulose  Alcohol use disorder - CIWA protocol, bedside counseling, no s/s of withdrawal.   Hyponatremia - Hypervolemic, from liver disease.  - Now within normal limits. Monitor while getting Lasix. Stable at 136 this morning   HTN - Hypertensive at admission. - Continue nadolol - controlled 110/60  Thrombocytopenia - From portal hypertension and cirrhosis, chronic alcoholism - with slight improvement today, plt 85  Hypothyroidism - Continue levothyroxine - Most recent TSH 7/20, 4.78  GERD - Continue PPI  Chronic Alcoholism - Monitor for withdrawal with CIWA protocol.  Vitamins ordered.   UTI  - completed treatment with ceftriaxone IV.     DVT prophylaxis: SCD  Code Status: Full code  Family Communication: No family at bedside  Disposition Plan: TBD  Consultants:   Nephrology  Gastroenterology  Procedures:   None   Antimicrobials:  Ceftriaxone 7/21 >> 7/25  Subjective: - urine is "clearing" - no chest pain, shortness of breath, + mild abdominal pain, no nausea or vomiting.   Objective: Vitals:   11/27/15 1353 11/27/15 1617 11/27/15 2039 11/28/15 0451  BP: (!) 111/50 (!) 109/54 (!) 110/54 110/60  Pulse: 77  75 75 75  Resp: 18 18 18 18   Temp: 97.8 F (36.6 C) 98.3 F (36.8 C) 98.7 F (37.1 C) 97.6 F (36.4 C)  TempSrc: Oral Oral Oral Oral  SpO2: 100% 100% 100% 100%  Weight:    110.2 kg (242 lb 15.2 oz)  Height:        Intake/Output Summary (Last 24 hours) at 11/28/15 1016 Last data filed at 11/28/15 0924  Gross per 24 hour  Intake              1158 ml  Output             3000 ml  Net            -1842 ml   Filed Weights   11/26/15 0522 11/27/15 0524 11/28/15 0451  Weight: 111 kg (244 lb 11.4 oz) 110.3 kg (243 lb 3.2 oz) 110.2 kg (242 lb 15.2 oz)    Examination: Constitutional: NAD Vitals:   11/27/15 1353 11/27/15 1617 11/27/15 2039 11/28/15 0451  BP: (!) 111/50 (!) 109/54 (!) 110/54 110/60  Pulse: 77 75 75 75  Resp: 18 18 18 18   Temp: 97.8 F (36.6 C) 98.3 F (36.8 C) 98.7 F (37.1 C) 97.6 F (36.4 C)  TempSrc: Oral Oral Oral Oral  SpO2: 100% 100% 100% 100%  Weight:    110.2 kg (242 lb 15.2 oz)  Height:       ENMT: Mucous membranes are moist.  Respiratory: clear to auscultation bilaterally, no wheezing, no crackles. Normal respiratory effort. No accessory muscle use.  Cardiovascular: Regular rate and rhythm, no murmurs / rubs / gallops. 2+ pitting LE edema.  Abdomen: no tenderness. Bowel sounds positive. Mild ascites, not tense Musculoskeletal: no clubbing / cyanosis.  Skin: no rashes, lesions, ulcers. No induration Neurologic: non focal    Data Reviewed: I have personally reviewed following labs and imaging studies  CBC:  Recent Labs Lab 11/24/15 0504 11/25/15 0510 11/26/15 0550 11/27/15 0456 11/28/15 0758  WBC 4.1 4.5 4.6 3.9* 3.9*  NEUTROABS 2.2  --   --   --   --   HGB 7.4* 7.4* 7.4* 7.0* 8.1*  HCT 23.0* 22.1* 22.3* 21.0* 24.3*  MCV 99.1 96.1 97.0 97.2 95.7  PLT 77* 74* 76* 82* 85*   Basic Metabolic Panel:  Recent Labs Lab 11/22/15 0427 11/23/15 0350 11/24/15 0504 11/25/15 0510 11/26/15 0550 11/27/15 0456 11/28/15 0758  NA 134* 134* 134* 134* 135 136 136  K 5.9* 5.6* 3.7 3.6 3.5 3.4* 3.4*  CL 108 107 105 105 105 104 103  CO2 18* 19* 19* 18* 21* 23 24  GLUCOSE 87 92 110* 92 86 90 79  BUN 56* 55* 64* 63* 62* 67* 65*  CREATININE 5.51* 6.18* 6.53* 6.89* 6.81* 6.66* 6.15*  CALCIUM 8.3* 7.8* 7.6* 7.4* 7.4* 7.4* 7.8*  MG  --  1.2*  --   --   --   --   --   PHOS 3.5 3.5 3.8  --  3.6   --   --    GFR: Estimated Creatinine Clearance: 17.3 mL/min (by C-G formula based on SCr of 6.15 mg/dL). Liver Function Tests:  Recent Labs Lab 11/23/15 0350 11/24/15 0504 11/25/15 0510 11/26/15 0550 11/27/15 0456  AST  --   --  26  --  25  ALT  --   --  9*  --  10*  ALKPHOS  --   --  93  --  102  BILITOT  --   --  1.1  --  1.1  PROT  --   --  6.2*  --  5.8*  ALBUMIN 2.7* 2.9* 2.9* 2.7* 2.6*   No results for input(s): LIPASE, AMYLASE in the last 168 hours.  Recent Labs Lab 11/26/15 0550  AMMONIA 86*   Coagulation Profile: No results for input(s): INR, PROTIME in the last 168 hours. Cardiac Enzymes: No results for input(s): CKTOTAL, CKMB, CKMBINDEX, TROPONINI in the last 168 hours. BNP (last 3 results) No results for input(s): PROBNP in the last 8760 hours. HbA1C: No results for input(s): HGBA1C in the last 72 hours. CBG:  Recent Labs Lab 11/26/15 0805  GLUCAP 78   Lipid Profile: No results for input(s): CHOL, HDL, LDLCALC, TRIG, CHOLHDL, LDLDIRECT in the last 72 hours. Thyroid Function Tests: No results for input(s): TSH, T4TOTAL, FREET4, T3FREE, THYROIDAB in the last 72 hours. Anemia Panel: No results for input(s): VITAMINB12, FOLATE, FERRITIN, TIBC, IRON, RETICCTPCT in the last 72 hours. Urine analysis:    Component Value Date/Time   COLORURINE RED (A) 11/21/2015 1512   APPEARANCEUR TURBID (A) 11/21/2015 1512   LABSPEC 1.013 11/21/2015 1512   PHURINE 6.0 11/21/2015 1512   GLUCOSEU NEGATIVE 11/21/2015 1512   HGBUR LARGE (A) 11/21/2015 1512   HGBUR negative 04/25/2008 0833   BILIRUBINUR MODERATE (A) 11/21/2015 1512   BILIRUBINUR 2+ 08/03/2011 1347   KETONESUR 15 (A) 11/21/2015 1512   PROTEINUR >300 (A) 11/21/2015 1512   UROBILINOGEN 1.0 11/03/2014 2035   NITRITE POSITIVE (A) 11/21/2015 1512   LEUKOCYTESUR LARGE (A) 11/21/2015 1512   Sepsis Labs: Invalid input(s): PROCALCITONIN, LACTICIDVEN  Recent Results (from the past 240 hour(s))  Culture, body  fluid-bottle     Status: None   Collection Time: 11/21/15  4:30 PM  Result Value Ref Range Status   Specimen Description FLUID PERITONEAL  Final   Special Requests BOTTLES DRAWN AEROBIC AND ANAEROBIC 5CC  Final   Gram Stain RARE WBC SEEN NO ORGANISMS SEEN   Final   Culture   Final    NO GROWTH 5 DAYS Performed at [facility]    Report Status 11/26/2015 FINAL  Final  Culture, body fluid-bottle     Status: None (Preliminary result)   Collection Time: 11/24/15  3:37 PM  Result Value Ref Range Status   Specimen Description FLUID PERITONEAL  Final   Special Requests BOTTLES DRAWN AEROBIC AND ANAEROBIC 10CC  Final   Culture   Final    NO GROWTH 3 DAYS Performed at [facility]    Report Status PENDING  Incomplete  Gram stain     Status: None   Collection Time: 11/24/15  3:37 PM  Result Value Ref Range Status   Specimen Description FLUID PERITONEAL  Final   Special Requests NONE  Final   Gram Stain   Final    RARE WBC PRESENT, PREDOMINANTLY MONONUCLEAR FEW RED BLOOD CELLS NO ORGANISMS SEEN Performed at [facility]    Report Status 11/24/2015 FINAL  Final      Radiology Studies: Dg Chest 2 View  Result Date: 11/26/2015 CLINICAL DATA:  Hx. Volume overload, no chest pain or sob, no congestion, slight cough EXAM: CHEST  2 VIEW COMPARISON:  11/06/2014 FINDINGS: Cardiac silhouette is top-normal in size. No mediastinal or hilar masses or evidence of adenopathy. Clear lungs.  No significant pleural effusion.  No pneumothorax. Bony thorax is demineralized but intact. IMPRESSION: No active cardiopulmonary disease. Electronically Signed   By: Lajean Manes M.D.   On: 11/26/2015  10:35    Scheduled Meds: . sodium chloride   Intravenous Once  . folic acid  1 mg Oral Daily  . furosemide  40 mg Intravenous BID  . lactulose  10 g Oral TID  . levothyroxine  50 mcg Oral QAC breakfast  . multivitamin with minerals  1 tablet Oral Daily  . nadolol  40 mg Oral Daily  .  pantoprazole  40 mg Oral BID AC  . thiamine  100 mg Oral Daily   Or  . thiamine  100 mg Intravenous Daily  . venlafaxine XR  150 mg Oral Q breakfast   Continuous Infusions:    Marzetta Board, MD, PhD [facility] Pager [phone]  If 7PM-7AM, please contact night-coverage [email or web address] Password [facility] 11/28/2015, 10:16 AM

## 2015-11-28 NOTE — Evaluation (Signed)
Physical Therapy One Time Evaluation Patient Details Name: Austin Padilla. MRN: [id number] DOB: [birth year] Today's Date: 11/28/2015   History of Present Illness  55 y.o. 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 All education has been completed and the patient has no further questions. Pt mobilizing well with his SPC and encouraged to ambulate with staff during acute stay. See below for any follow-up Physical Therapy or equipment needs. PT is signing off. Thank you for this referral.     Follow Up Recommendations No PT follow up    Equipment Recommendations  None recommended by PT    Recommendations for Other Services       Precautions / Restrictions Precautions Precautions: None      Mobility  Bed Mobility Overal bed mobility: Modified Independent                Transfers Overall transfer level: Needs assistance Equipment used: Straight cane Transfers: Sit to/from Stand Sit to Stand: Supervision;Modified independent (Device/Increase time)            Ambulation/Gait Ambulation/Gait assistance: Supervision Ambulation Distance (Feet): 280 Feet Assistive device: Straight cane Gait Pattern/deviations: Wide base of support     General Gait Details: no unsteadiness or LOB observed, denies any symptoms, reports a little weakness  Stairs            Wheelchair Mobility    Modified Rankin (Stroke Patients Only)       Balance                                             Pertinent Vitals/Pain Pain Assessment: No/denies pain    Home Living Family/patient expects to be discharged to:: Private residence Living Arrangements: Alone   Type of Home: House Home Access: Stairs to  enter   Technical brewer of Steps: 3 Home Layout: One level Home Equipment: Cane - single point      Prior Function Level of Independence: Independent with assistive device(s)               Hand Dominance        Extremity/Trunk Assessment   Upper Extremity Assessment: Overall WFL for tasks assessed           Lower Extremity Assessment: Overall WFL for tasks assessed         Communication   Communication: No difficulties  Cognition Arousal/Alertness: Awake/alert Behavior During Therapy: WFL for tasks assessed/performed Overall Cognitive Status: Within Functional Limits for tasks assessed                      General Comments      Exercises        Assessment/Plan    PT Assessment Patent does not need any further PT services  PT Diagnosis Difficulty walking   PT Problem List    PT Treatment Interventions     PT Goals (Current goals can be found in the Care Plan section) Acute Rehab PT Goals PT Goal Formulation: All assessment and education complete, DC therapy    Frequency     Barriers to discharge        Co-evaluation  End of Session   Activity Tolerance: Patient tolerated treatment well Patient left: in chair;with chair alarm set;with call bell/phone within reach Nurse Communication: Mobility status         Time: [id number] PT Time Calculation (min) (ACUTE ONLY): 16 min   Charges:   PT Evaluation $PT Eval Low Complexity: 1 Procedure     PT G Codes:        Austin Padilla,Austin Padilla 11/28/2015, 2:52 PM Austin Padilla, PT, DPT 11/28/2015 Pager: [phone]

## 2015-11-29 LAB — CBC
HEMATOCRIT: 23.1 % — AB (ref 39.0–52.0)
HEMOGLOBIN: 7.6 g/dL — AB (ref 13.0–17.0)
MCH: 31.4 pg (ref 26.0–34.0)
MCHC: 32.9 g/dL (ref 30.0–36.0)
MCV: 95.5 fL (ref 78.0–100.0)
Platelets: 82 10*3/uL — ABNORMAL LOW (ref 150–400)
RBC: 2.42 MIL/uL — ABNORMAL LOW (ref 4.22–5.81)
RDW: 18.5 % — AB (ref 11.5–15.5)
WBC: 3.7 10*3/uL — AB (ref 4.0–10.5)

## 2015-11-29 LAB — CULTURE, BODY FLUID-BOTTLE: CULTURE: NO GROWTH

## 2015-11-29 LAB — BASIC METABOLIC PANEL
ANION GAP: 9 (ref 5–15)
BUN: 69 mg/dL — ABNORMAL HIGH (ref 6–20)
CALCIUM: 7.8 mg/dL — AB (ref 8.9–10.3)
CHLORIDE: 102 mmol/L (ref 101–111)
CO2: 24 mmol/L (ref 22–32)
Creatinine, Ser: 5.85 mg/dL — ABNORMAL HIGH (ref 0.61–1.24)
GFR calc Af Amer: 11 mL/min — ABNORMAL LOW (ref >60)
GFR calc non Af Amer: 10 mL/min — ABNORMAL LOW (ref >60)
GLUCOSE: 81 mg/dL (ref 65–99)
Potassium: 3.7 mmol/L (ref 3.5–5.1)
Sodium: 135 mmol/L (ref 135–145)

## 2015-11-29 LAB — CULTURE, BODY FLUID W GRAM STAIN -BOTTLE

## 2015-11-29 MED ORDER — FUROSEMIDE 10 MG/ML IJ SOLN
60.0000 mg | Freq: Two times a day (BID) | INTRAMUSCULAR | Status: DC
  Administered 2015-11-29 – 2015-12-03 (×8): 60 mg via INTRAVENOUS
  Filled 2015-11-29 (×8): qty 6

## 2015-11-29 MED ORDER — CALCIUM CARBONATE ANTACID 500 MG PO CHEW: 1.0000 | CHEWABLE_TABLET | Freq: Two times a day (BID) | ORAL | Status: DC | PRN

## 2015-11-29 NOTE — Progress Notes (Signed)
[facility] Progress Note   Subjective: 3.4 L UOP yesterday, creat down 5.85, BP's good  Vitals:   11/28/15 0451 11/28/15 1344 11/28/15 2115 11/29/15 0506  BP: 110/60 118/62 114/62 128/65  Pulse: 75 73 81 75  Resp: 18 18 18 20   Temp: 97.6 F (36.4 C) 97.6 F (36.4 C) 98.2 F (36.8 C) 98.2 F (36.8 C)  TempSrc: Oral Oral Oral Oral  SpO2: 100% 100% 100% 100%  Weight: 110.2 kg (242 lb 15.2 oz)   110.7 kg (244 lb 0.8 oz)  Height:        Inpatient medications: . sodium chloride   Intravenous Once  . folic acid  1 mg Oral Daily  . furosemide  40 mg Intravenous BID  . lactulose  10 g Oral TID  . levothyroxine  50 mcg Oral QAC breakfast  . multivitamin with minerals  1 tablet Oral Daily  . nadolol  40 mg Oral Daily  . pantoprazole  40 mg Oral BID AC  . thiamine  100 mg Oral Daily   Or  . thiamine  100 mg Intravenous Daily  . venlafaxine XR  150 mg Oral Q breakfast     acetaminophen **OR** acetaminophen, oxyCODONE  Exam: Awake, alert, no distress, a little cachectic No jvd Chest clear bilat RRR no mrg Abd sig 3+ ascites, obese Ext 2+ leg edema bilat Neuro alert, no asterixis, ox 3, gen'd weakness  UA > turbid, many bact, > 300 prot, tntc rbc/ wbc UNa > 77, UCr 87 UPC ratio 2.23 Renal US:  11.7/ 9.8 cm, no hydro Urine sediment (by undersigned) > no RBC/WBC or gran casts, nondysmorphic RBC's 25-50 per hpf,5-10 wbc/ hpf      Assessment: 1.  AKI in setting of cirrhosis/ ascites - prob ATN (NSAID's / 3rd spacing from cirrhosis + low alb). No evidence GN by urine sediment. Hx of severe AKI last year and again now is predictor of ^'d mortality and ^'d ESRD risk.  Creat 4.5 on admission, was 0.8 in Sept 2016.  Creat peaked here at 6.9 , now down to 5.85, excellent UOP on IV lasix w still significant edema in the legs.   2.  Cirrhosis/ etoh - somewhat debilitated 3.  Ascites - s/p 5L paracentesis 7/24 4.  Hist depression 5.  Vol excess / edema/ ascites -  diuresing   Plan - ^lasix 60 bid, will see again Monday   Kelly Splinter MD [place] pager [phone]    cell [phone] 11/25/2015, 10:58 AM   Recent Labs Lab 11/23/15 0350 11/24/15 0504  11/26/15 0550 11/27/15 0456 11/28/15 0758 11/29/15 0545  NA 134* 134*  < > 135 136 136 135  K 5.6* 3.7  < > 3.5 3.4* 3.4* 3.7  CL 107 105  < > 105 104 103 102  CO2 19* 19*  < > 21* 23 24 24   GLUCOSE 92 110*  < > 86 90 79 81  BUN 55* 64*  < > 62* 67* 65* 69*  CREATININE 6.18* 6.53*  < > 6.81* 6.66* 6.15* 5.85*  CALCIUM 7.8* 7.6*  < > 7.4* 7.4* 7.8* 7.8*  PHOS 3.5 3.8  --  3.6  --   --   --   < > = values in this interval not displayed.  Recent Labs Lab 11/25/15 0510 11/26/15 0550 11/27/15 0456  AST 26  --  25  ALT 9*  --  10*  ALKPHOS 93  --  102  BILITOT 1.1  --  1.1  PROT 6.2*  --  5.8*  ALBUMIN 2.9* 2.7* 2.6*    Recent Labs Lab 11/24/15 0504  11/27/15 0456 11/28/15 0758 11/29/15 0545  WBC 4.1  < > 3.9* 3.9* 3.7*  NEUTROABS 2.2  --   --   --   --   HGB 7.4*  < > 7.0* 8.1* 7.6*  HCT 23.0*  < > 21.0* 24.3* 23.1*  MCV 99.1  < > 97.2 95.7 95.5  PLT 77*  < > 82* 85* 82*  < > = values in this interval not displayed. Iron/TIBC/Ferritin/ %Sat    Component Value Date/Time   IRON 102 11/20/2015 2348   TIBC 297 11/20/2015 2348   FERRITIN 59 11/20/2015 2348   IRONPCTSAT 34 11/20/2015 2348

## 2015-11-29 NOTE — Progress Notes (Signed)
Assumed care of patient. Agree with previous nurse assessment. No complaints or concerns at this time.  Barbee Shropshire. Brigitte Pulse, RN

## 2015-11-29 NOTE — Progress Notes (Signed)
PROGRESS NOTE  Austin Padilla. [id number] DOB: 1961-02-27 DOA: 11/20/2015 PCP: Joycelyn Man, MD   LOS: 9 days   Brief Narrative: Austin Padilla. is a 55 y.o. 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 The patient was in his usual state of health until about one month ago when he started service decreased appetite, increased swelling in the belly, and weakness. The weakness has progressed to be severe, so that he needs a cane, cannot stand in the kitchen long enough to make himself dinner, and gets short of breath with exertion. Now over the last 2 weeks he has had dark urine, oliguria, and marked pedal edema. He went to his PCP, who recommended he be evaluated at the hospital.    Assessment & Plan: Principal Problem:   Acute renal failure ([place]) Active Problems:   Essential hypertension   Alcoholic cirrhosis ([place])   Alcohol use disorder, moderate, dependence (HCC)   Ascites   Anemia, unspecified   Hyponatremia   Thrombocytopenia (HCC)   Acute kidney injury ([place])   Hyperkalemia   Acute kidney failure, with hyperkalemia  - His creatinine increased up to 6.8, plateau for couple days, started to get better today at 5.8 - no need for HD currently. Continue IV Lasix - Elevated urine sodium, unlikely hepatorenal syndrome, etiology not clear - autoimmune workup non diagnostic, ANA negative, MPO/Pr-3 negative, C3 mildly low, mornal C4  Anasarca - IV Lasix per nephrology - daily weights, 258 on admission >> 244  Anemia of Chronic Disease:  - s/p 2 units PRBC, hemoglobin improved but trending down again on 7/27 status post 1 unit of packed red blood cell. Hemoglobin improved appropriately to 8.1 on 7/28, trending back down to 7.6 today. No need for transfusion but closely monitor - No active GI bleeding found. Transfuse as needed.   - GI  following, and they plan on EGD prior to patient's discharge, likely early next week  Liver cirrhosis with ascites  - Liver disease due to alcohol abuse - Status post paracentesis 2, 7/21 for diagnostic purposes and 7/24 which was large volume, followed by albumin - He has ascites on exam, however is not tense, does not need a repeat paracentesis for now. Monitor while getting Lasix. - Gastroenterology was consulted and following patient - elevated ammonia, started on lactulose. Mental status clear today   Alcohol use disorder - CIWA protocol, bedside counseling, no s/s of withdrawal.   Hyponatremia - Hypervolemic, from liver disease.  - Now within normal limits. Monitor while getting Lasix. Stable at 136 this morning   HTN - Hypertensive at admission. - Continue nadolol - controlled 128/65  Thrombocytopenia - From portal hypertension and cirrhosis, chronic alcoholism - with slight improvement today, plt 82 today. No apparent bleed   Hypothyroidism - Continue levothyroxine - Most recent TSH 7/20, 4.78  GERD - Continue PPI  Chronic Alcoholism - Monitor for withdrawal with CIWA protocol.  Vitamins ordered.   UTI  - completed treatment with ceftriaxone IV.     DVT prophylaxis: SCD  Code Status: Full code  Family Communication: No family at bedside  Disposition Plan: TBD  Consultants:   Nephrology  Gastroenterology  Procedures:   None   Antimicrobials:  Ceftriaxone 7/21 >> 7/25  Subjective: - no chest pain, shortness of breath, + mild abdominal pain, no nausea or vomiting.   Objective: Vitals:   11/28/15 0451  11/28/15 1344 11/28/15 2115 11/29/15 0506  BP: 110/60 118/62 114/62 128/65  Pulse: 75 73 81 75  Resp: 18 18 18 20   Temp: 97.6 F (36.4 C) 97.6 F (36.4 C) 98.2 F (36.8 C) 98.2 F (36.8 C)  TempSrc: Oral Oral Oral Oral  SpO2: 100% 100% 100% 100%  Weight: 110.2 kg (242 lb 15.2 oz)   110.7 kg (244 lb 0.8 oz)  Height:         Intake/Output Summary (Last 24 hours) at 11/29/15 0912 Last data filed at 11/29/15 0909  Gross per 24 hour  Intake              780 ml  Output             3350 ml  Net            -2570 ml   Filed Weights   11/27/15 0524 11/28/15 0451 11/29/15 0506  Weight: 110.3 kg (243 lb 3.2 oz) 110.2 kg (242 lb 15.2 oz) 110.7 kg (244 lb 0.8 oz)    Examination: Constitutional: NAD Vitals:   11/28/15 0451 11/28/15 1344 11/28/15 2115 11/29/15 0506  BP: 110/60 118/62 114/62 128/65  Pulse: 75 73 81 75  Resp: 18 18 18 20   Temp: 97.6 F (36.4 C) 97.6 F (36.4 C) 98.2 F (36.8 C) 98.2 F (36.8 C)  TempSrc: Oral Oral Oral Oral  SpO2: 100% 100% 100% 100%  Weight: 110.2 kg (242 lb 15.2 oz)   110.7 kg (244 lb 0.8 oz)  Height:       ENMT: Mucous membranes are moist.  Respiratory: clear to auscultation bilaterally, no wheezing, no crackles. Normal respiratory effort. No accessory muscle use.  Cardiovascular: Regular rate and rhythm, no murmurs / rubs / gallops. 2+ pitting LE edema.  Abdomen: no tenderness. Bowel sounds positive. Mild ascites, not tense Musculoskeletal: no clubbing / cyanosis.  Skin: no rashes, lesions, ulcers. No induration Neurologic: non focal    Data Reviewed: I have personally reviewed following labs and imaging studies  CBC:  Recent Labs Lab 11/24/15 0504 11/25/15 0510 11/26/15 0550 11/27/15 0456 11/28/15 0758 11/29/15 0545  WBC 4.1 4.5 4.6 3.9* 3.9* 3.7*  NEUTROABS 2.2  --   --   --   --   --   HGB 7.4* 7.4* 7.4* 7.0* 8.1* 7.6*  HCT 23.0* 22.1* 22.3* 21.0* 24.3* 23.1*  MCV 99.1 96.1 97.0 97.2 95.7 95.5  PLT 77* 74* 76* 82* 85* 82*   Basic Metabolic Panel:  Recent Labs Lab 11/23/15 0350 11/24/15 0504 11/25/15 0510 11/26/15 0550 11/27/15 0456 11/28/15 0758 11/29/15 0545  NA 134* 134* 134* 135 136 136 135  K 5.6* 3.7 3.6 3.5 3.4* 3.4* 3.7  CL 107 105 105 105 104 103 102  CO2 19* 19* 18* 21* 23 24 24   GLUCOSE 92 110* 92 86 90 79 81  BUN 55* 64*  63* 62* 67* 65* 69*  CREATININE 6.18* 6.53* 6.89* 6.81* 6.66* 6.15* 5.85*  CALCIUM 7.8* 7.6* 7.4* 7.4* 7.4* 7.8* 7.8*  MG 1.2*  --   --   --   --   --   --   PHOS 3.5 3.8  --  3.6  --   --   --    GFR: Estimated Creatinine Clearance: 18.3 mL/min (by C-G formula based on SCr of 5.85 mg/dL). Liver Function Tests:  Recent Labs Lab 11/23/15 0350 11/24/15 0504 11/25/15 0510 11/26/15 0550 11/27/15 0456  AST  --   --  26  --  25  ALT  --   --  9*  --  10*  ALKPHOS  --   --  93  --  102  BILITOT  --   --  1.1  --  1.1  PROT  --   --  6.2*  --  5.8*  ALBUMIN 2.7* 2.9* 2.9* 2.7* 2.6*   No results for input(s): LIPASE, AMYLASE in the last 168 hours.  Recent Labs Lab 11/26/15 0550  AMMONIA 86*   Coagulation Profile: No results for input(s): INR, PROTIME in the last 168 hours. Cardiac Enzymes: No results for input(s): CKTOTAL, CKMB, CKMBINDEX, TROPONINI in the last 168 hours. BNP (last 3 results) No results for input(s): PROBNP in the last 8760 hours. HbA1C: No results for input(s): HGBA1C in the last 72 hours. CBG:  Recent Labs Lab 11/26/15 0805  GLUCAP 78   Lipid Profile: No results for input(s): CHOL, HDL, LDLCALC, TRIG, CHOLHDL, LDLDIRECT in the last 72 hours. Thyroid Function Tests: No results for input(s): TSH, T4TOTAL, FREET4, T3FREE, THYROIDAB in the last 72 hours. Anemia Panel: No results for input(s): VITAMINB12, FOLATE, FERRITIN, TIBC, IRON, RETICCTPCT in the last 72 hours. Urine analysis:    Component Value Date/Time   COLORURINE RED (A) 11/21/2015 1512   APPEARANCEUR TURBID (A) 11/21/2015 1512   LABSPEC 1.013 11/21/2015 1512   PHURINE 6.0 11/21/2015 1512   GLUCOSEU NEGATIVE 11/21/2015 1512   HGBUR LARGE (A) 11/21/2015 1512   HGBUR negative 04/25/2008 0833   BILIRUBINUR MODERATE (A) 11/21/2015 1512   BILIRUBINUR 2+ 08/03/2011 1347   KETONESUR 15 (A) 11/21/2015 1512   PROTEINUR >300 (A) 11/21/2015 1512   UROBILINOGEN 1.0 11/03/2014 2035   NITRITE  POSITIVE (A) 11/21/2015 1512   LEUKOCYTESUR LARGE (A) 11/21/2015 1512   Sepsis Labs: Invalid input(s): PROCALCITONIN, LACTICIDVEN  Recent Results (from the past 240 hour(s))  Culture, body fluid-bottle     Status: None   Collection Time: 11/21/15  4:30 PM  Result Value Ref Range Status   Specimen Description FLUID PERITONEAL  Final   Special Requests BOTTLES DRAWN AEROBIC AND ANAEROBIC 5CC  Final   Gram Stain RARE WBC SEEN NO ORGANISMS SEEN   Final   Culture   Final    NO GROWTH 5 DAYS Performed at [facility]    Report Status 11/26/2015 FINAL  Final  Culture, body fluid-bottle     Status: None (Preliminary result)   Collection Time: 11/24/15  3:37 PM  Result Value Ref Range Status   Specimen Description FLUID PERITONEAL  Final   Special Requests BOTTLES DRAWN AEROBIC AND ANAEROBIC 10CC  Final   Culture   Final    NO GROWTH 4 DAYS Performed at [facility]    Report Status PENDING  Incomplete  Gram stain     Status: None   Collection Time: 11/24/15  3:37 PM  Result Value Ref Range Status   Specimen Description FLUID PERITONEAL  Final   Special Requests NONE  Final   Gram Stain   Final    RARE WBC PRESENT, PREDOMINANTLY MONONUCLEAR FEW RED BLOOD CELLS NO ORGANISMS SEEN Performed at [facility]    Report Status 11/24/2015 FINAL  Final      Radiology Studies: No results found.   Scheduled Meds: . sodium chloride   Intravenous Once  . folic acid  1 mg Oral Daily  . furosemide  40 mg Intravenous BID  . lactulose  10 g Oral TID  . levothyroxine  50 mcg Oral  QAC breakfast  . multivitamin with minerals  1 tablet Oral Daily  . nadolol  40 mg Oral Daily  . pantoprazole  40 mg Oral BID AC  . thiamine  100 mg Oral Daily   Or  . thiamine  100 mg Intravenous Daily  . venlafaxine XR  150 mg Oral Q breakfast   Continuous Infusions:    Marzetta Board, MD, PhD [facility] Pager [phone]  If 7PM-7AM, please contact  night-coverage [email or web address] Password [email or web address] 11/29/2015, 9:12 AM

## 2015-11-30 LAB — COMPREHENSIVE METABOLIC PANEL
ALT: 11 U/L — ABNORMAL LOW (ref 17–63)
AST: 31 U/L (ref 15–41)
Albumin: 2.8 g/dL — ABNORMAL LOW (ref 3.5–5.0)
Alkaline Phosphatase: 103 U/L (ref 38–126)
Anion gap: 11 (ref 5–15)
BILIRUBIN TOTAL: 1.2 mg/dL (ref 0.3–1.2)
BUN: 68 mg/dL — ABNORMAL HIGH (ref 6–20)
CHLORIDE: 101 mmol/L (ref 101–111)
CO2: 24 mmol/L (ref 22–32)
CREATININE: 5.2 mg/dL — AB (ref 0.61–1.24)
Calcium: 8.1 mg/dL — ABNORMAL LOW (ref 8.9–10.3)
GFR, EST AFRICAN AMERICAN: 13 mL/min — AB (ref >60)
GFR, EST NON AFRICAN AMERICAN: 11 mL/min — AB (ref >60)
Glucose, Bld: 91 mg/dL (ref 65–99)
POTASSIUM: 3.5 mmol/L (ref 3.5–5.1)
Sodium: 136 mmol/L (ref 135–145)
TOTAL PROTEIN: 6.5 g/dL (ref 6.5–8.1)

## 2015-11-30 LAB — CBC
HEMATOCRIT: 22.6 % — AB (ref 39.0–52.0)
Hemoglobin: 7.7 g/dL — ABNORMAL LOW (ref 13.0–17.0)
MCH: 32.5 pg (ref 26.0–34.0)
MCHC: 34.1 g/dL (ref 30.0–36.0)
MCV: 95.4 fL (ref 78.0–100.0)
PLATELETS: 82 10*3/uL — AB (ref 150–400)
RBC: 2.37 MIL/uL — AB (ref 4.22–5.81)
RDW: 18.2 % — AB (ref 11.5–15.5)
WBC: 4 10*3/uL (ref 4.0–10.5)

## 2015-11-30 MED ORDER — POTASSIUM CHLORIDE CRYS ER 20 MEQ PO TBCR
30.0000 meq | EXTENDED_RELEASE_TABLET | Freq: Three times a day (TID) | ORAL | Status: AC
  Administered 2015-11-30 (×2): 30 meq via ORAL
  Filled 2015-11-30 (×2): qty 1

## 2015-11-30 MED ORDER — BACLOFEN 10 MG PO TABS: 5.0000 mg | ORAL_TABLET | Freq: Two times a day (BID) | ORAL | Status: DC | PRN

## 2015-11-30 MED ORDER — GLYCERIN (LAXATIVE) 2.1 G RE SUPP
1.0000 | Freq: Every day | RECTAL | Status: DC | PRN
  Filled 2015-11-30: qty 1

## 2015-11-30 MED ORDER — SENNOSIDES-DOCUSATE SODIUM 8.6-50 MG PO TABS
2.0000 | ORAL_TABLET | Freq: Every day | ORAL | Status: DC
  Administered 2015-11-30 – 2015-12-05 (×6): 2 via ORAL
  Filled 2015-11-30 (×6): qty 2

## 2015-11-30 NOTE — Progress Notes (Signed)
PROGRESS NOTE  Austin Padilla. [id number] DOB: [birth year] DOA: 11/20/2015 PCP: Joycelyn Man, MD   LOS: 10 days   Brief Narrative: Austin Padilla. is a 55 y.o. 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 The patient was in his usual state of health until about one month ago when he started service decreased appetite, increased swelling in the belly, and weakness. The weakness has progressed to be severe, so that he needs a cane, cannot stand in the kitchen long enough to make himself dinner, and gets short of breath with exertion. Now over the last 2 weeks he has had dark urine, oliguria, and marked pedal edema. He went to his PCP, who recommended he be evaluated at the hospital.    Assessment & Plan: Principal Problem:   Acute renal failure ([place]) Active Problems:   Essential hypertension   Alcoholic cirrhosis ([place])   Alcohol use disorder, moderate, dependence (HCC)   Ascites   Anemia, unspecified   Hyponatremia   Thrombocytopenia (HCC)   Acute kidney injury ([place])   Hyperkalemia   Acute kidney failure, with hyperkalemia  - His creatinine increased up to 6.8, plateau for couple days, improving, 5.2 this morning - no need for HD currently. Continue IV Lasix - Elevated urine sodium, unlikely hepatorenal syndrome, etiology not clear - autoimmune workup non diagnostic, ANA negative, MPO/Pr-3 negative, C3 mildly low, mornal C4  Anasarca - IV Lasix per nephrology - daily weights, 258 on admission >> 243  Anemia of Chronic Disease:  - s/p 2 units PRBC, hemoglobin improved but trending down again on 7/27 status post 1 unit of packed red blood cell. Hemoglobin improved appropriately to 8.1 on 7/28, trending back down to 7.6 today. No need for transfusion but closely monitor - No active GI bleeding found. Transfuse as needed.   - GI following,  and they plan on EGD prior to patient's discharge, likely tomorrow or Tuesday   Liver cirrhosis with ascites  - Liver disease due to alcohol abuse - Status post paracentesis 2, 7/21 for diagnostic purposes and 7/24 which was large volume, followed by albumin - He has ascites on exam, however is not tense, does not need a repeat paracentesis for now. Monitor while getting Lasix. - Gastroenterology was consulted and following patient - elevated ammonia, started on lactulose. Mental status remains clear  Alcohol use disorder - CIWA protocol, no s/s of withdrawal.   Hyponatremia - Hypervolemic, from liver disease.  - Now within normal limits. Monitor while getting Lasix. Stable at 136  HTN - Hypertensive at admission. - Continue nadolol - controlled 112/57  Thrombocytopenia - From portal hypertension and cirrhosis, chronic alcoholism - with slight improvement today, plt 82 today. No apparent bleed   Hypothyroidism - Continue levothyroxine - Most recent TSH 7/20, 4.78  GERD - Continue PPI  Chronic Alcoholism - Monitor for withdrawal with CIWA protocol.  Vitamins ordered.   UTI  - completed treatment with ceftriaxone IV.     DVT prophylaxis: SCD  Code Status: Full code  Family Communication: No family at bedside  Disposition Plan: TBD   Consultants:   Nephrology  Gastroenterology  Procedures:   None   Antimicrobials:  Ceftriaxone 7/21 >> 7/25  Subjective: - no chest pain, shortness of breath, + mild abdominal pain, no nausea or vomiting.   Objective: Vitals:   11/29/15 1301 11/29/15 2048 11/30/15 0500 11/30/15 [id number]  BP: (!) 108/52 (!) 118/57  (!) 112/57  Pulse: 72 69  69  Resp: 20 18  20   Temp: 98.7 F (37.1 C) 98.2 F (36.8 C)  97.7 F (36.5 C)  TempSrc: Oral Oral  Oral  SpO2: 100% 100%  99%  Weight:   110.5 kg (243 lb 9.6 oz)   Height:        Intake/Output Summary (Last 24 hours) at 11/30/15 1240 Last data filed at 11/30/15 0831   Gross per 24 hour  Intake              568 ml  Output             1725 ml  Net            -1157 ml   Filed Weights   11/28/15 0451 11/29/15 0506 11/30/15 0500  Weight: 110.2 kg (242 lb 15.2 oz) 110.7 kg (244 lb 0.8 oz) 110.5 kg (243 lb 9.6 oz)    Examination: Constitutional: NAD Vitals:   11/29/15 1301 11/29/15 2048 11/30/15 0500 11/30/15 0506  BP: (!) 108/52 (!) 118/57  (!) 112/57  Pulse: 72 69  69  Resp: 20 18  20   Temp: 98.7 F (37.1 C) 98.2 F (36.8 C)  97.7 F (36.5 C)  TempSrc: Oral Oral  Oral  SpO2: 100% 100%  99%  Weight:   110.5 kg (243 lb 9.6 oz)   Height:       ENMT: Mucous membranes are moist.  Respiratory: clear to auscultation bilaterally, no wheezing, no crackles. Normal respiratory effort. No accessory muscle use.  Cardiovascular: Regular rate and rhythm, no murmurs / rubs / gallops. 2+ pitting LE edema.  Abdomen: no tenderness. Bowel sounds positive. Mild ascites, not tense Musculoskeletal: no clubbing / cyanosis.  Skin: no rashes, lesions, ulcers. No induration Neurologic: non focal    Data Reviewed: I have personally reviewed following labs and imaging studies  CBC:  Recent Labs Lab 11/24/15 0504  11/26/15 0550 11/27/15 0456 11/28/15 0758 11/29/15 0545 11/30/15 0523  WBC 4.1  < > 4.6 3.9* 3.9* 3.7* 4.0  NEUTROABS 2.2  --   --   --   --   --   --   HGB 7.4*  < > 7.4* 7.0* 8.1* 7.6* 7.7*  HCT 23.0*  < > 22.3* 21.0* 24.3* 23.1* 22.6*  MCV 99.1  < > 97.0 97.2 95.7 95.5 95.4  PLT 77*  < > 76* 82* 85* 82* 82*  < > = values in this interval not displayed. Basic Metabolic Panel:  Recent Labs Lab 11/24/15 0504  11/26/15 0550 11/27/15 0456 11/28/15 0758 11/29/15 0545 11/30/15 0523  NA 134*  < > 135 136 136 135 136  K 3.7  < > 3.5 3.4* 3.4* 3.7 3.5  CL 105  < > 105 104 103 102 101  CO2 19*  < > 21* 23 24 24 24   GLUCOSE 110*  < > 86 90 79 81 91  BUN 64*  < > 62* 67* 65* 69* 68*  CREATININE 6.53*  < > 6.81* 6.66* 6.15* 5.85* 5.20*  CALCIUM  7.6*  < > 7.4* 7.4* 7.8* 7.8* 8.1*  PHOS 3.8  --  3.6  --   --   --   --   < > = values in this interval not displayed. GFR: Estimated Creatinine Clearance: 20.5 mL/min (by C-G formula based on SCr of 5.2 mg/dL). Liver Function Tests:  Recent Labs Lab 11/24/15 0504 11/25/15 0510 11/26/15  [id number] 11/27/15 0456 11/30/15 0523  AST  --  26  --  25 31  ALT  --  9*  --  10* 11*  ALKPHOS  --  93  --  102 103  BILITOT  --  1.1  --  1.1 1.2  PROT  --  6.2*  --  5.8* 6.5  ALBUMIN 2.9* 2.9* 2.7* 2.6* 2.8*   No results for input(s): LIPASE, AMYLASE in the last 168 hours.  Recent Labs Lab 11/26/15 0550  AMMONIA 86*   Coagulation Profile: No results for input(s): INR, PROTIME in the last 168 hours. Cardiac Enzymes: No results for input(s): CKTOTAL, CKMB, CKMBINDEX, TROPONINI in the last 168 hours. BNP (last 3 results) No results for input(s): PROBNP in the last 8760 hours. HbA1C: No results for input(s): HGBA1C in the last 72 hours. CBG:  Recent Labs Lab 11/26/15 0805  GLUCAP 78   Lipid Profile: No results for input(s): CHOL, HDL, LDLCALC, TRIG, CHOLHDL, LDLDIRECT in the last 72 hours. Thyroid Function Tests: No results for input(s): TSH, T4TOTAL, FREET4, T3FREE, THYROIDAB in the last 72 hours. Anemia Panel: No results for input(s): VITAMINB12, FOLATE, FERRITIN, TIBC, IRON, RETICCTPCT in the last 72 hours. Urine analysis:    Component Value Date/Time   COLORURINE RED (A) 11/21/2015 1512   APPEARANCEUR TURBID (A) 11/21/2015 1512   LABSPEC 1.013 11/21/2015 1512   PHURINE 6.0 11/21/2015 1512   GLUCOSEU NEGATIVE 11/21/2015 1512   HGBUR LARGE (A) 11/21/2015 1512   HGBUR negative 04/25/2008 0833   BILIRUBINUR MODERATE (A) 11/21/2015 1512   BILIRUBINUR 2+ 08/03/2011 1347   KETONESUR 15 (A) 11/21/2015 1512   PROTEINUR >300 (A) 11/21/2015 1512   UROBILINOGEN 1.0 11/03/2014 2035   NITRITE POSITIVE (A) 11/21/2015 1512   LEUKOCYTESUR LARGE (A) 11/21/2015 1512   Sepsis  Labs: Invalid input(s): PROCALCITONIN, LACTICIDVEN  Recent Results (from the past 240 hour(s))  Culture, body fluid-bottle     Status: None   Collection Time: 11/21/15  4:30 PM  Result Value Ref Range Status   Specimen Description FLUID PERITONEAL  Final   Special Requests BOTTLES DRAWN AEROBIC AND ANAEROBIC 5CC  Final   Gram Stain RARE WBC SEEN NO ORGANISMS SEEN   Final   Culture   Final    NO GROWTH 5 DAYS Performed at [facility]    Report Status 11/26/2015 FINAL  Final  Culture, body fluid-bottle     Status: None   Collection Time: 11/24/15  3:37 PM  Result Value Ref Range Status   Specimen Description FLUID PERITONEAL  Final   Special Requests BOTTLES DRAWN AEROBIC AND ANAEROBIC 10CC  Final   Culture   Final    NO GROWTH 5 DAYS Performed at [facility]    Report Status 11/29/2015 FINAL  Final  Gram stain     Status: None   Collection Time: 11/24/15  3:37 PM  Result Value Ref Range Status   Specimen Description FLUID PERITONEAL  Final   Special Requests NONE  Final   Gram Stain   Final    RARE WBC PRESENT, PREDOMINANTLY MONONUCLEAR FEW RED BLOOD CELLS NO ORGANISMS SEEN Performed at [facility]    Report Status 11/24/2015 FINAL  Final      Radiology Studies: No results found.   Scheduled Meds: . sodium chloride   Intravenous Once  . folic acid  1 mg Oral Daily  . furosemide  60 mg Intravenous BID  . lactulose  10 g Oral TID  .  levothyroxine  50 mcg Oral QAC breakfast  . multivitamin with minerals  1 tablet Oral Daily  . nadolol  40 mg Oral Daily  . pantoprazole  40 mg Oral BID AC  . potassium chloride  30 mEq Oral Q8H  . senna-docusate  2 tablet Oral QHS  . thiamine  100 mg Oral Daily   Or  . thiamine  100 mg Intravenous Daily  . venlafaxine XR  150 mg Oral Q breakfast   Continuous Infusions:    Marzetta Board, MD, PhD [facility] Pager [phone]  If 7PM-7AM, please contact  night-coverage [email or web address] Password [email or web address] 11/30/2015, 12:40 PM

## 2015-12-01 LAB — CBC
HEMATOCRIT: 23.1 % — AB (ref 39.0–52.0)
HEMOGLOBIN: 7.7 g/dL — AB (ref 13.0–17.0)
MCH: 31.8 pg (ref 26.0–34.0)
MCHC: 33.3 g/dL (ref 30.0–36.0)
MCV: 95.5 fL (ref 78.0–100.0)
Platelets: 96 10*3/uL — ABNORMAL LOW (ref 150–400)
RBC: 2.42 MIL/uL — ABNORMAL LOW (ref 4.22–5.81)
RDW: 17.8 % — AB (ref 11.5–15.5)
WBC: 4.5 10*3/uL (ref 4.0–10.5)

## 2015-12-01 LAB — BASIC METABOLIC PANEL
Anion gap: 9 (ref 5–15)
BUN: 76 mg/dL — ABNORMAL HIGH (ref 6–20)
CALCIUM: 8.4 mg/dL — AB (ref 8.9–10.3)
CHLORIDE: 100 mmol/L — AB (ref 101–111)
CO2: 26 mmol/L (ref 22–32)
CREATININE: 5.03 mg/dL — AB (ref 0.61–1.24)
GFR calc Af Amer: 14 mL/min — ABNORMAL LOW (ref >60)
GFR calc non Af Amer: 12 mL/min — ABNORMAL LOW (ref >60)
GLUCOSE: 83 mg/dL (ref 65–99)
Potassium: 3.9 mmol/L (ref 3.5–5.1)
Sodium: 135 mmol/L (ref 135–145)

## 2015-12-01 MED ORDER — DARBEPOETIN ALFA 100 MCG/0.5ML IJ SOSY
100.0000 ug | PREFILLED_SYRINGE | INTRAMUSCULAR | Status: DC
  Administered 2015-12-02: 100 ug via SUBCUTANEOUS
  Filled 2015-12-01 (×2): qty 0.5

## 2015-12-01 NOTE — Progress Notes (Signed)
Patient ID: Austin Padilla., male   DOB: October 16, 1960, 55 y.o.   MRN: [id number]   Gi following peripherally  HGb 7.7 Creat 5.03  Plan is for EGD prior to discharge,renal had advised he remain hospitalized until Creat  Less than 3 Please call later this week when he is ready for EGD

## 2015-12-01 NOTE — Progress Notes (Signed)
PROGRESS NOTE  Austin Padilla. [id number] DOB: 17-Aug-1960 DOA: 11/20/2015 PCP: Joycelyn Man, MD   LOS: 11 days   Brief Narrative: Austin Padilla. is a 55 y.o. 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 The patient was in his usual state of health until about one month ago when he started service decreased appetite, increased swelling in the belly, and weakness. The weakness has progressed to be severe, so that he needs a cane, cannot stand in the kitchen long enough to make himself dinner, and gets short of breath with exertion. Now over the last 2 weeks he has had dark urine, oliguria, and marked pedal edema. He went to his PCP, who recommended he be evaluated at the hospital.   Assessment & Plan: Principal Problem:   Acute renal failure ([place]) Active Problems:   Essential hypertension   Alcoholic cirrhosis ([place])   Alcohol use disorder, moderate, dependence (HCC)   Ascites   Anemia, unspecified   Hyponatremia   Thrombocytopenia (HCC)   Acute kidney injury ([place])   Hyperkalemia    Acute kidney failure, with hyperkalemia  - His creatinine increased up to 6.8, plateau for couple days, improving, 5.0 this morning - no need for HD currently. Continue IV Lasix - Elevated urine sodium, unlikely hepatorenal syndrome, etiology not clear - autoimmune workup non diagnostic, ANA negative, MPO/Pr-3 negative, C3 mildly low, mornal C4 - discussed with Dr. Moshe Cipro, if Cr and volume status continue to improve will change to po. Will let GI know for EGD planning.   Anasarca - IV Lasix per nephrology - daily weights, 258 on admission >> 242  Anemia of Chronic Disease:  - s/p 2 units PRBC, hemoglobin improved but trending down again on 7/27 status post 1 unit of packed red blood cell. Hemoglobin improved appropriately to 8.1 on 7/28, trending back  down to 7.6 today. No need for transfusion but closely monitor - No active GI bleeding found. Transfuse as needed.   - GI following, and they plan on EGD prior to patient's discharge, close to discharge  Liver cirrhosis with ascites  - Liver disease due to alcohol abuse - Status post paracentesis 2, 7/21 for diagnostic purposes and 7/24 which was large volume, followed by albumin - He has ascites on exam, however is not tense, does not need a repeat paracentesis for now. Monitor while getting Lasix. - Gastroenterology was consulted and following patient - elevated ammonia, started on lactulose. Mental status remains clear  Constipation - no BM in 20 days per patient - lactulose appears not enough, patient more slow today, wondering whether he is becoming more encephalopathic - enema today   Alcohol use disorder - CIWA protocol, no s/s of withdrawal.   Hyponatremia - Hypervolemic, from liver disease.  - Now within normal limits. Monitor while getting Lasix. Stable at 136  HTN - Hypertensive at admission. - Continue nadolol - controlled 118/63  Thrombocytopenia - From portal hypertension and cirrhosis, chronic alcoholism - with slight improvement today, plt 96 today. No apparent bleed   Hypothyroidism - Continue levothyroxine - Most recent TSH 7/20, 4.78  GERD - Continue PPI  Chronic Alcoholism - Monitor for withdrawal with CIWA protocol.  Vitamins ordered.   UTI  - completed treatment with ceftriaxone IV.     DVT prophylaxis: SCD  Code Status: Full code  Family Communication: No family at bedside  Disposition Plan: TBD  Consultants:   Nephrology  Gastroenterology  Procedures:   None   Antimicrobials:  Ceftriaxone 7/21 >> 7/25  Subjective: - no chest pain, shortness of breath, + mild abdominal pain, no nausea or vomiting.   Objective: Vitals:   11/30/15 0506 11/30/15 1430 11/30/15 2104 12/01/15 0543  BP: (!) 112/57 (!) 111/59 125/63  118/63  Pulse: 69 68 73 67  Resp: 20 18 18 18   Temp: 97.7 F (36.5 C) 98.1 F (36.7 C) 98.7 F (37.1 C) 97.3 F (36.3 C)  TempSrc: Oral Oral Oral Oral  SpO2: 99% 100% 100% 97%  Weight:    109.8 kg (242 lb 1 oz)  Height:        Intake/Output Summary (Last 24 hours) at 12/01/15 1222 Last data filed at 12/01/15 0500  Gross per 24 hour  Intake              418 ml  Output             2000 ml  Net            -1582 ml   Filed Weights   11/29/15 0506 11/30/15 0500 12/01/15 0543  Weight: 110.7 kg (244 lb 0.8 oz) 110.5 kg (243 lb 9.6 oz) 109.8 kg (242 lb 1 oz)    Examination: Constitutional: NAD Vitals:   11/30/15 0506 11/30/15 1430 11/30/15 2104 12/01/15 0543  BP: (!) 112/57 (!) 111/59 125/63 118/63  Pulse: 69 68 73 67  Resp: 20 18 18 18   Temp: 97.7 F (36.5 C) 98.1 F (36.7 C) 98.7 F (37.1 C) 97.3 F (36.3 C)  TempSrc: Oral Oral Oral Oral  SpO2: 99% 100% 100% 97%  Weight:    109.8 kg (242 lb 1 oz)  Height:       ENMT: Mucous membranes are moist.  Respiratory: clear to auscultation bilaterally, no wheezing, no crackles. Normal respiratory effort. No accessory muscle use.  Cardiovascular: Regular rate and rhythm, no murmurs / rubs / gallops. 2+ pitting LE edema.  Abdomen: no tenderness. Bowel sounds positive. Mild ascites, not tense Musculoskeletal: no clubbing / cyanosis.  Skin: no rashes, lesions, ulcers. No induration Neurologic: non focal    Data Reviewed: I have personally reviewed following labs and imaging studies  CBC:  Recent Labs Lab 11/27/15 0456 11/28/15 0758 11/29/15 0545 11/30/15 0523 12/01/15 0520  WBC 3.9* 3.9* 3.7* 4.0 4.5  HGB 7.0* 8.1* 7.6* 7.7* 7.7*  HCT 21.0* 24.3* 23.1* 22.6* 23.1*  MCV 97.2 95.7 95.5 95.4 95.5  PLT 82* 85* 82* 82* 96*   Basic Metabolic Panel:  Recent Labs Lab 11/26/15 0550 11/27/15 0456 11/28/15 0758 11/29/15 0545 11/30/15 0523 12/01/15 0520  NA 135 136 136 135 136 135  K 3.5 3.4* 3.4* 3.7 3.5 3.9  CL 105  104 103 102 101 100*  CO2 21* 23 24 24 24 26   GLUCOSE 86 90 79 81 91 83  BUN 62* 67* 65* 69* 68* 76*  CREATININE 6.81* 6.66* 6.15* 5.85* 5.20* 5.03*  CALCIUM 7.4* 7.4* 7.8* 7.8* 8.1* 8.4*  PHOS 3.6  --   --   --   --   --    GFR: Estimated Creatinine Clearance: 21.2 mL/min (by C-G formula based on SCr of 5.03 mg/dL). Liver Function Tests:  Recent Labs Lab 11/25/15 0510 11/26/15 0550 11/27/15 0456 11/30/15 0523  AST 26  --  25 31  ALT 9*  --  10* 11*  ALKPHOS 93  --  102 103  BILITOT 1.1  --  1.1 1.2  PROT 6.2*  --  5.8* 6.5  ALBUMIN 2.9* 2.7* 2.6* 2.8*   No results for input(s): LIPASE, AMYLASE in the last 168 hours.  Recent Labs Lab 11/26/15 0550  AMMONIA 86*   Coagulation Profile: No results for input(s): INR, PROTIME in the last 168 hours. Cardiac Enzymes: No results for input(s): CKTOTAL, CKMB, CKMBINDEX, TROPONINI in the last 168 hours. BNP (last 3 results) No results for input(s): PROBNP in the last 8760 hours. HbA1C: No results for input(s): HGBA1C in the last 72 hours. CBG:  Recent Labs Lab 11/26/15 0805  GLUCAP 78   Lipid Profile: No results for input(s): CHOL, HDL, LDLCALC, TRIG, CHOLHDL, LDLDIRECT in the last 72 hours. Thyroid Function Tests: No results for input(s): TSH, T4TOTAL, FREET4, T3FREE, THYROIDAB in the last 72 hours. Anemia Panel: No results for input(s): VITAMINB12, FOLATE, FERRITIN, TIBC, IRON, RETICCTPCT in the last 72 hours. Urine analysis:    Component Value Date/Time   COLORURINE RED (A) 11/21/2015 1512   APPEARANCEUR TURBID (A) 11/21/2015 1512   LABSPEC 1.013 11/21/2015 1512   PHURINE 6.0 11/21/2015 1512   GLUCOSEU NEGATIVE 11/21/2015 1512   HGBUR LARGE (A) 11/21/2015 1512   HGBUR negative 04/25/2008 0833   BILIRUBINUR MODERATE (A) 11/21/2015 1512   BILIRUBINUR 2+ 08/03/2011 1347   KETONESUR 15 (A) 11/21/2015 1512   PROTEINUR >300 (A) 11/21/2015 1512   UROBILINOGEN 1.0 11/03/2014 2035   NITRITE POSITIVE (A) 11/21/2015  1512   LEUKOCYTESUR LARGE (A) 11/21/2015 1512   Sepsis Labs: Invalid input(s): PROCALCITONIN, LACTICIDVEN  Recent Results (from the past 240 hour(s))  Culture, body fluid-bottle     Status: None   Collection Time: 11/21/15  4:30 PM  Result Value Ref Range Status   Specimen Description FLUID PERITONEAL  Final   Special Requests BOTTLES DRAWN AEROBIC AND ANAEROBIC 5CC  Final   Gram Stain RARE WBC SEEN NO ORGANISMS SEEN   Final   Culture   Final    NO GROWTH 5 DAYS Performed at [facility]    Report Status 11/26/2015 FINAL  Final  Culture, body fluid-bottle     Status: None   Collection Time: 11/24/15  3:37 PM  Result Value Ref Range Status   Specimen Description FLUID PERITONEAL  Final   Special Requests BOTTLES DRAWN AEROBIC AND ANAEROBIC 10CC  Final   Culture   Final    NO GROWTH 5 DAYS Performed at [facility]    Report Status 11/29/2015 FINAL  Final  Gram stain     Status: None   Collection Time: 11/24/15  3:37 PM  Result Value Ref Range Status   Specimen Description FLUID PERITONEAL  Final   Special Requests NONE  Final   Gram Stain   Final    RARE WBC PRESENT, PREDOMINANTLY MONONUCLEAR FEW RED BLOOD CELLS NO ORGANISMS SEEN Performed at [facility]    Report Status 11/24/2015 FINAL  Final      Radiology Studies: No results found.   Scheduled Meds: . sodium chloride   Intravenous Once  . [START ON 12/02/2015] darbepoetin (ARANESP) injection - NON-DIALYSIS  100 mcg Subcutaneous Q Tue-1800  . folic acid  1 mg Oral Daily  . furosemide  60 mg Intravenous BID  . lactulose  10 g Oral TID  . levothyroxine  50 mcg Oral QAC breakfast  . multivitamin with minerals  1 tablet Oral Daily  . nadolol  40 mg Oral Daily  . pantoprazole  40 mg Oral BID AC  .  senna-docusate  2 tablet Oral QHS  . thiamine  100 mg Oral Daily   Or  . thiamine  100 mg Intravenous Daily  . venlafaxine XR  150 mg Oral Q breakfast   Continuous Infusions:    Marzetta Board, MD, PhD [facility] Pager [phone]  If 7PM-7AM, please contact night-coverage [email or web address] Password [facility] 12/01/2015, 12:22 PM

## 2015-12-01 NOTE — Progress Notes (Signed)
Subjective:  2600 of UOP - creatinine down some but BUN is up  Objective Vital signs in last 24 hours: Vitals:   11/30/15 0506 11/30/15 1430 11/30/15 2104 12/01/15 0543  BP: (!) 112/57 (!) 111/59 125/63 118/63  Pulse: 69 68 73 67  Resp: 20 18 18 18   Temp: 97.7 F (36.5 C) 98.1 F (36.7 C) 98.7 F (37.1 C) 97.3 F (36.3 C)  TempSrc: Oral Oral Oral Oral  SpO2: 99% 100% 100% 97%  Weight:    109.8 kg (242 lb 1 oz)  Height:       Weight change: -0.696 kg (-1 lb 8.6 oz)  Intake/Output Summary (Last 24 hours) at 12/01/15 1213 Last data filed at 12/01/15 0500  Gross per 24 hour  Intake              418 ml  Output             2000 ml  Net            -1582 ml    Assessment/ Plan: Pt is a 55 y.o. yo male with history of AKI who was admitted on 11/20/2015 with recurrent AKI in the setting of cirrhosis/low BP   Assessment/Plan: 1. AKI- creatinine 0.8 in September 2016- had hematuria but all serologies negative- ultrasound shows some asymetry to kidneys- fortunately with supportive care- renal function has started to improve- creatinine peaked in the high 6's - now down to 5- nonoliguric- no indications for HD 2. Volume- third spacing due to liver dz and hypoalbuminemia- making urine on moderated lasix- no change for now 3. Anemia- significant issue- will give ESA and check iron stores- consider transfusion- GI planning for EGD 4. Hypokalemia- on repletion- to continue 5. Dispo- if creatinine cont to come down- then will change to PO lasix   Harutyun Monteverde A    Labs: Basic Metabolic Panel:  Recent Labs Lab 11/26/15 0550  11/29/15 0545 11/30/15 0523 12/01/15 0520  NA 135  < > 135 136 135  K 3.5  < > 3.7 3.5 3.9  CL 105  < > 102 101 100*  CO2 21*  < > 24 24 26   GLUCOSE 86  < > 81 91 83  BUN 62*  < > 69* 68* 76*  CREATININE 6.81*  < > 5.85* 5.20* 5.03*  CALCIUM 7.4*  < > 7.8* 8.1* 8.4*  PHOS 3.6  --   --   --   --   < > = values in this interval not displayed. Liver  Function Tests:  Recent Labs Lab 11/25/15 0510 11/26/15 0550 11/27/15 0456 11/30/15 0523  AST 26  --  25 31  ALT 9*  --  10* 11*  ALKPHOS 93  --  102 103  BILITOT 1.1  --  1.1 1.2  PROT 6.2*  --  5.8* 6.5  ALBUMIN 2.9* 2.7* 2.6* 2.8*   No results for input(s): LIPASE, AMYLASE in the last 168 hours.  Recent Labs Lab 11/26/15 0550  AMMONIA 86*   CBC:  Recent Labs Lab 11/27/15 0456 11/28/15 0758 11/29/15 0545 11/30/15 0523 12/01/15 0520  WBC 3.9* 3.9* 3.7* 4.0 4.5  HGB 7.0* 8.1* 7.6* 7.7* 7.7*  HCT 21.0* 24.3* 23.1* 22.6* 23.1*  MCV 97.2 95.7 95.5 95.4 95.5  PLT 82* 85* 82* 82* 96*   Cardiac Enzymes: No results for input(s): CKTOTAL, CKMB, CKMBINDEX, TROPONINI in the last 168 hours. CBG:  Recent Labs Lab 11/26/15 0805  GLUCAP 78    Iron Studies:  No results for input(s): IRON, TIBC, TRANSFERRIN, FERRITIN in the last 72 hours. Studies/Results: No results found. Medications: Infusions:    Scheduled Medications: . sodium chloride   Intravenous Once  . folic acid  1 mg Oral Daily  . furosemide  60 mg Intravenous BID  . lactulose  10 g Oral TID  . levothyroxine  50 mcg Oral QAC breakfast  . multivitamin with minerals  1 tablet Oral Daily  . nadolol  40 mg Oral Daily  . pantoprazole  40 mg Oral BID AC  . senna-docusate  2 tablet Oral QHS  . thiamine  100 mg Oral Daily   Or  . thiamine  100 mg Intravenous Daily  . venlafaxine XR  150 mg Oral Q breakfast    have reviewed scheduled and prn medications.  Physical Exam: General: a little slow Heart: RRR Lungs: mostly clear Abdomen: distended Extremities: pitting edema    12/01/2015,12:13 PM  LOS: 11 days

## 2015-12-02 DIAGNOSIS — K259 Gastric ulcer, unspecified as acute or chronic, without hemorrhage or perforation: Secondary | ICD-10-CM | POA: Diagnosis not present

## 2015-12-02 DIAGNOSIS — I8500 Esophageal varices without bleeding: Secondary | ICD-10-CM | POA: Diagnosis not present

## 2015-12-02 DIAGNOSIS — K7031 Alcoholic cirrhosis of liver with ascites: Secondary | ICD-10-CM | POA: Diagnosis not present

## 2015-12-02 DIAGNOSIS — D649 Anemia, unspecified: Secondary | ICD-10-CM | POA: Diagnosis not present

## 2015-12-02 LAB — RENAL FUNCTION PANEL
ALBUMIN: 2.8 g/dL — AB (ref 3.5–5.0)
ANION GAP: 11 (ref 5–15)
BUN: 69 mg/dL — AB (ref 6–20)
CHLORIDE: 98 mmol/L — AB (ref 101–111)
CO2: 25 mmol/L (ref 22–32)
Calcium: 8.4 mg/dL — ABNORMAL LOW (ref 8.9–10.3)
Creatinine, Ser: 4.6 mg/dL — ABNORMAL HIGH (ref 0.61–1.24)
GFR calc Af Amer: 15 mL/min — ABNORMAL LOW (ref >60)
GFR calc non Af Amer: 13 mL/min — ABNORMAL LOW (ref >60)
GLUCOSE: 79 mg/dL (ref 65–99)
PHOSPHORUS: 3.6 mg/dL (ref 2.5–4.6)
POTASSIUM: 3.6 mmol/L (ref 3.5–5.1)
Sodium: 134 mmol/L — ABNORMAL LOW (ref 135–145)

## 2015-12-02 LAB — CBC
HEMATOCRIT: 24 % — AB (ref 39.0–52.0)
HEMOGLOBIN: 8 g/dL — AB (ref 13.0–17.0)
MCH: 31.6 pg (ref 26.0–34.0)
MCHC: 33.3 g/dL (ref 30.0–36.0)
MCV: 94.9 fL (ref 78.0–100.0)
Platelets: 111 10*3/uL — ABNORMAL LOW (ref 150–400)
RBC: 2.53 MIL/uL — ABNORMAL LOW (ref 4.22–5.81)
RDW: 17.5 % — AB (ref 11.5–15.5)
WBC: 4.3 10*3/uL (ref 4.0–10.5)

## 2015-12-02 LAB — FERRITIN: FERRITIN: 59 ng/mL (ref 24–336)

## 2015-12-02 LAB — IRON AND TIBC
Iron: 57 ug/dL (ref 45–182)
SATURATION RATIOS: 22 % (ref 17.9–39.5)
TIBC: 258 ug/dL (ref 250–450)
UIBC: 201 ug/dL

## 2015-12-02 NOTE — Progress Notes (Signed)
Nutrition Brief Note  Patient identified for LOS (day #12).  Wt Readings from Last 15 Encounters:  12/02/15 234 lb 12.8 oz (106.5 kg)  11/20/15 261 lb (118.4 kg)  06/17/15 252 lb (114.3 kg)  01/14/15 230 lb (104.3 kg)  11/26/14 214 lb (97.1 kg)  11/21/14 215 lb (97.5 kg)  11/08/14 254 lb 10.1 oz (115.5 kg)  09/05/13 248 lb (112.5 kg)  03/05/13 256 lb (116.1 kg)  05/08/12 227 lb 3.2 oz (103.1 kg)  12/06/11 238 lb (108 kg)  09/17/11 222 lb 2 oz (100.8 kg)  08/27/11 235 lb (106.6 kg)  08/04/11 230 lb 4 oz (104.4 kg)  06/13/09 220 lb (99.8 kg)    Body mass index is 32.75 kg/m. Patient meets criteria for obesity based on current BMI.   Current diet order is Renal with 1200cc fluid restriction, patient is consuming approximately 100% of meals since admission. Labs reviewed. Medications reviewed; daily multivitamin with minerals, 60 mg IV Lasix BID.   Pt with PMH including cirrhosis. On admission, paracentesis performed with 5L removed and IV Lasix started for diuresis at that time. Pt to be NPO at midnight tonight for EGD tomorrow.   No nutrition interventions warranted at this time. If nutrition issues arise, please consult RD.     Jarome Matin, MS, RD, LDN Inpatient Clinical Dietitian Pager # [phone] After hours/weekend pager # [phone]

## 2015-12-02 NOTE — Progress Notes (Signed)
Patient ID: Austin Glass., male   DOB: 1960-07-04, 55 y.o.   MRN: [id number]    Progress Note   Subjective   No current c/o hgb 8  Stable  creat 4.6   Objective   Vital signs in last 24 hours: Temp:  [97.9 F (36.6 C)-98.1 F (36.7 C)] 98.1 F (36.7 C) (08/01 0607) Pulse Rate:  [68-69] 69 (08/01 0607) Resp:  [16-18] 16 (08/01 0607) BP: (104-129)/(51-67) 129/64 (08/01 0607) SpO2:  [99 %-100 %] 100 % (08/01 0607) Weight:  [234 lb 12.8 oz (106.5 kg)] 234 lb 12.8 oz (106.5 kg) (08/01 0607) Last BM Date: 12/01/15 General:    white male  in NAD Heart:  Regular rate and rhythm; no murmurs Lungs: Respirations even and unlabored, lungs CTA bilaterally Abdomen:  Soft, large nontender  Extremities:  Without edema. Neurologic:  Alert and oriented,  grossly normal neurologically. Psych:  Cooperative. Normal mood and affect.  Intake/Output from previous day: 07/31 0701 - 08/01 0700 In: 446 [P.O.:446] Out: 1850 [Urine:1850] Intake/Output this shift: Total I/O In: -  Out: 225 [Urine:225]  Lab Results:  Recent Labs  11/30/15 0523 12/01/15 0520 12/02/15 0526  WBC 4.0 4.5 4.3  HGB 7.7* 7.7* 8.0*  HCT 22.6* 23.1* 24.0*  PLT 82* 96* 111*   BMET  Recent Labs  11/30/15 0523 12/01/15 0520 12/02/15 0526  NA 136 135 134*  K 3.5 3.9 3.6  CL 101 100* 98*  CO2 24 26 25   GLUCOSE 91 83 79  BUN 68* 76* 69*  CREATININE 5.20* 5.03* 4.60*  CALCIUM 8.1* 8.4* 8.4*   LFT  Recent Labs  11/30/15 0523 12/02/15 0526  PROT 6.5  --   ALBUMIN 2.8* 2.8*  AST 31  --   ALT 11*  --   ALKPHOS 103  --   BILITOT 1.2  --    PT/INR No results for input(s): LABPROT, INR in the last 72 hours.  Studies/Results: No results found.     Assessment / Plan:    #1 55 yo WM alcoholic with cirrhosis admitted 7/21 with ascites, MELD 30 , and acute renal failure  EGD has been on hold  Due to renal failure- no active bleeding, needs variceal screening. Pt has been stable though kidney  function not improving much. Hospitalist  has asked that we go ahead with EGD  Plan; EGD scheduled for 8:30 am tomorrow with Dr Havery Moros- procedure discussed in detail l with pt and he is agreeable to proceed. 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

## 2015-12-02 NOTE — Progress Notes (Signed)
Subjective:  1800 of UOP - creatinine and BUN both down  Objective Vital signs in last 24 hours: Vitals:   12/01/15 0543 12/01/15 1406 12/01/15 2301 12/02/15 0607  BP: 118/63 (!) 104/51 126/67 129/64  Pulse: 67 68 68 69  Resp: 18 18 18 16   Temp: 97.3 F (36.3 C) 97.9 F (36.6 C) 97.9 F (36.6 C) 98.1 F (36.7 C)  TempSrc: Oral Oral Oral Oral  SpO2: 97% 99% 100% 100%  Weight: 109.8 kg (242 lb 1 oz)   106.5 kg (234 lb 12.8 oz)  Height:       Weight change: -3.295 kg (-7 lb 4.2 oz)  Intake/Output Summary (Last 24 hours) at 12/02/15 1112 Last data filed at 12/02/15 0929  Gross per 24 hour  Intake              446 ml  Output             1425 ml  Net             -979 ml    Assessment/ Plan: Pt is a 55 y.o. yo male with history of AKI who was admitted on 11/20/2015 with recurrent AKI in the setting of cirrhosis/low BP   Assessment/Plan: 1. AKI- creatinine 0.8 in September 2016- had hematuria but all serologies negative- ultrasound shows some asymetry to kidneys- fortunately with supportive care renal function has started to improve so seemed to be c/w ATN- creatinine peaked in the high 6's - now down to 4.6- nonoliguric- no indications for HD 2. Volume- third spacing due to liver dz and hypoalbuminemia- making urine on moderated lasix- no change for now 3. Anemia- significant issue- giving ESA  iron stores OK- consider transfusion- GI planning for EGD eventually 4. Hypokalemia- on repletion- to continue 5. Dispo- if creatinine cont to come down- then will change to PO lasix- probably tomorrow - he says is not getting PT ?    Austin Padilla A    Labs: Basic Metabolic Panel:  Recent Labs Lab 11/26/15 0550  11/30/15 0523 12/01/15 0520 12/02/15 0526  NA 135  < > 136 135 134*  K 3.5  < > 3.5 3.9 3.6  CL 105  < > 101 100* 98*  CO2 21*  < > 24 26 25   GLUCOSE 86  < > 91 83 79  BUN 62*  < > 68* 76* 69*  CREATININE 6.81*  < > 5.20* 5.03* 4.60*  CALCIUM 7.4*  < > 8.1* 8.4*  8.4*  PHOS 3.6  --   --   --  3.6  < > = values in this interval not displayed. Liver Function Tests:  Recent Labs Lab 11/27/15 0456 11/30/15 0523 12/02/15 0526  AST 25 31  --   ALT 10* 11*  --   ALKPHOS 102 103  --   BILITOT 1.1 1.2  --   PROT 5.8* 6.5  --   ALBUMIN 2.6* 2.8* 2.8*   No results for input(s): LIPASE, AMYLASE in the last 168 hours.  Recent Labs Lab 11/26/15 0550  AMMONIA 86*   CBC:  Recent Labs Lab 11/28/15 0758 11/29/15 0545 11/30/15 0523 12/01/15 0520 12/02/15 0526  WBC 3.9* 3.7* 4.0 4.5 4.3  HGB 8.1* 7.6* 7.7* 7.7* 8.0*  HCT 24.3* 23.1* 22.6* 23.1* 24.0*  MCV 95.7 95.5 95.4 95.5 94.9  PLT 85* 82* 82* 96* 111*   Cardiac Enzymes: No results for input(s): CKTOTAL, CKMB, CKMBINDEX, TROPONINI in the last 168 hours. CBG:  Recent Labs Lab  11/26/15 0805  GLUCAP 78    Iron Studies:   Recent Labs  12/02/15 0526  IRON 57  TIBC 258  FERRITIN 59   Studies/Results: No results found. Medications: Infusions:    Scheduled Medications: . sodium chloride   Intravenous Once  . darbepoetin (ARANESP) injection - NON-DIALYSIS  100 mcg Subcutaneous Q Tue-1800  . folic acid  1 mg Oral Daily  . furosemide  60 mg Intravenous BID  . lactulose  10 g Oral TID  . levothyroxine  50 mcg Oral QAC breakfast  . multivitamin with minerals  1 tablet Oral Daily  . nadolol  40 mg Oral Daily  . pantoprazole  40 mg Oral BID AC  . senna-docusate  2 tablet Oral QHS  . thiamine  100 mg Oral Daily   Or  . thiamine  100 mg Intravenous Daily  . venlafaxine XR  150 mg Oral Q breakfast    have reviewed scheduled and prn medications.  Physical Exam: General: a little slow Heart: RRR Lungs: mostly clear Abdomen: distended Extremities: pitting edema    12/02/2015,11:12 AM  LOS: 12 days

## 2015-12-02 NOTE — Progress Notes (Signed)
PROGRESS NOTE  Austin Padilla. [id number] DOB: Mar 31, 1961 DOA: 11/20/2015 PCP: Joycelyn Man, MD   LOS: 12 days   Brief Narrative: Austin Padilla. is a 55 y.o. 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 The patient was in his usual state of health until about one month ago when he started service decreased appetite, increased swelling in the belly, and weakness. The weakness has progressed to be severe, so that he needs a cane, cannot stand in the kitchen long enough to make himself dinner, and gets short of breath with exertion. Now over the last 2 weeks he has had dark urine, oliguria, and marked pedal edema. He went to his PCP, who recommended he be evaluated at the hospital.  Patient was admitted, and gastroenterology and nephrology were consulted following patient in the hospital. He underwent paracentesis with 5 L of fluid removal, and was placed on IV diuresis, his renal function initially got worse and hemodialysis was entertained, however he recovered and he is not improving. EGD is planned for tomorrow 8/2  Assessment & Plan: Principal Problem:   Acute renal failure ([place]) Active Problems:   Essential hypertension   Alcoholic cirrhosis (HCC)   Alcohol use disorder, moderate, dependence (HCC)   Ascites   Anemia, unspecified   Hyponatremia   Thrombocytopenia (HCC)   Acute kidney injury ([place])   Hyperkalemia   Acute kidney failure, with hyperkalemia  - His creatinine increased up to 6.8, plateau for couple days, improving, 4.6 this morning - no need for HD currently. Continue IV Lasix. Discussed with Dr. Moshe Cipro yesterday - Elevated urine sodium, unlikely hepatorenal syndrome, etiology not clear - autoimmune workup non diagnostic, ANA negative, MPO/Pr-3 negative, C3 mildly low, mornal C4 - Management  and conversion to by mouth Lasix  per nephrology   Anasarca - IV Lasix per nephrology. Replete potassium as needed - daily weights, 258 on admission >> 234 this morning  Anemia of Chronic Disease:  - s/p 2 units PRBC, hemoglobin improved but trending down again on 7/27 status post 1 unit of packed red blood cell. Hemoglobin improved appropriately to 8.1 on 7/28 and has remained stable since . No need for transfusion but closely monitor - No active GI bleeding found. Transfuse as needed.   - GI following, and they plan on EGD prior to patient's discharge, close to discharge. Given improvement in his renal function, I anticipate discharge by the end of the week, discussed with GI today, they will schedule an EGD hopefully tomorrow  Liver cirrhosis with ascites  - Liver disease due to alcohol abuse - Status post paracentesis 2, 7/21 for diagnostic purposes and 7/24 which was large volume, followed by albumin - He has ascites on exam, however is not tense, does not need a repeat paracentesis for now. Monitor while getting Lasix. - Gastroenterology was consulted and following patient, - elevated ammonia, started on lactulose. Mental status remains clear  Constipation - no BM in 20 days per patient - Received enema on 7/31 with excellent results   Alcohol use disorder - CIWA protocol, no s/s of withdrawal.   Hyponatremia - Hypervolemic, from liver disease.  - Now within normal limits. Monitor while getting Lasix.  - 134 this morning  HTN - Hypertensive at admission. - Continue nadolol - controlled 129/64  Thrombocytopenia - From portal hypertension and cirrhosis, chronic alcoholism - Continues to improve, platelets 111 today  Hypothyroidism - Continue levothyroxine - Most recent TSH 7/20, 4.78  GERD - Continue PPI  Chronic Alcoholism - Counseled for cessation   UTI  - completed treatment with ceftriaxone IV.     DVT prophylaxis: SCD  Code Status: Full code  Family Communication: No family at  bedside  Disposition Plan: TBD   Consultants:   Nephrology  Gastroenterology  Procedures:   None   Antimicrobials:  Ceftriaxone 7/21 >> 7/25  Subjective: - no chest pain, shortness of breath, + mild abdominal pain, no nausea or vomiting.   Objective: Vitals:   12/01/15 0543 12/01/15 1406 12/01/15 2301 12/02/15 0607  BP: 118/63 (!) 104/51 126/67 129/64  Pulse: 67 68 68 69  Resp: 18 18 18 16   Temp: 97.3 F (36.3 C) 97.9 F (36.6 C) 97.9 F (36.6 C) 98.1 F (36.7 C)  TempSrc: Oral Oral Oral Oral  SpO2: 97% 99% 100% 100%  Weight: 109.8 kg (242 lb 1 oz)   106.5 kg (234 lb 12.8 oz)  Height:        Intake/Output Summary (Last 24 hours) at 12/02/15 0957 Last data filed at 12/02/15 0929  Gross per 24 hour  Intake              446 ml  Output             1775 ml  Net            -1329 ml   Filed Weights   11/30/15 0500 12/01/15 0543 12/02/15 0607  Weight: 110.5 kg (243 lb 9.6 oz) 109.8 kg (242 lb 1 oz) 106.5 kg (234 lb 12.8 oz)    Examination: Constitutional: NAD Vitals:   12/01/15 0543 12/01/15 1406 12/01/15 2301 12/02/15 0607  BP: 118/63 (!) 104/51 126/67 129/64  Pulse: 67 68 68 69  Resp: 18 18 18 16   Temp: 97.3 F (36.3 C) 97.9 F (36.6 C) 97.9 F (36.6 C) 98.1 F (36.7 C)  TempSrc: Oral Oral Oral Oral  SpO2: 97% 99% 100% 100%  Weight: 109.8 kg (242 lb 1 oz)   106.5 kg (234 lb 12.8 oz)  Height:       ENMT: Mucous membranes are moist.  Respiratory: clear to auscultation bilaterally, no wheezing, no crackles. Normal respiratory effort. No accessory muscle use.  Cardiovascular: Regular rate and rhythm, no murmurs / rubs / gallops. 2+ pitting LE edema.  Abdomen: no tenderness. Bowel sounds positive. Ascites present, not tense Musculoskeletal: no clubbing / cyanosis.  Skin: no rashes, lesions, ulcers. No induration Neurologic: non focal    Data Reviewed: I have personally reviewed following labs and imaging studies  CBC:  Recent Labs Lab  11/28/15 0758 11/29/15 0545 11/30/15 0523 12/01/15 0520 12/02/15 0526  WBC 3.9* 3.7* 4.0 4.5 4.3  HGB 8.1* 7.6* 7.7* 7.7* 8.0*  HCT 24.3* 23.1* 22.6* 23.1* 24.0*  MCV 95.7 95.5 95.4 95.5 94.9  PLT 85* 82* 82* 96* [id number]*   Basic Metabolic Panel:  Recent Labs Lab 11/26/15 0550  11/28/15 0758 11/29/15 0545 11/30/15 0523 12/01/15 0520 12/02/15 0526  NA 135  < > 136 135 136 135 134*  K 3.5  < > 3.4* 3.7 3.5 3.9 3.6  CL 105  < > 103 102 101 100* 98*  CO2 21*  < > 24 24 24 26 25   GLUCOSE 86  < > 79 81 91 83 79  BUN 62*  < > 65* 69* 68* 76* 69*  CREATININE 6.81*  < > 6.15* 5.85* 5.20* 5.03*  4.60*  CALCIUM 7.4*  < > 7.8* 7.8* 8.1* 8.4* 8.4*  PHOS 3.6  --   --   --   --   --  3.6  < > = values in this interval not displayed. GFR: Estimated Creatinine Clearance: 22.5 mL/min (by C-G formula based on SCr of 4.6 mg/dL). Liver Function Tests:  Recent Labs Lab 11/26/15 0550 11/27/15 0456 11/30/15 0523 12/02/15 0526  AST  --  25 31  --   ALT  --  10* 11*  --   ALKPHOS  --  102 103  --   BILITOT  --  1.1 1.2  --   PROT  --  5.8* 6.5  --   ALBUMIN 2.7* 2.6* 2.8* 2.8*   No results for input(s): LIPASE, AMYLASE in the last 168 hours.  Recent Labs Lab 11/26/15 0550  AMMONIA 86*   Coagulation Profile: No results for input(s): INR, PROTIME in the last 168 hours. Cardiac Enzymes: No results for input(s): CKTOTAL, CKMB, CKMBINDEX, TROPONINI in the last 168 hours. BNP (last 3 results) No results for input(s): PROBNP in the last 8760 hours. HbA1C: No results for input(s): HGBA1C in the last 72 hours. CBG:  Recent Labs Lab 11/26/15 0805  GLUCAP 78   Lipid Profile: No results for input(s): CHOL, HDL, LDLCALC, TRIG, CHOLHDL, LDLDIRECT in the last 72 hours. Thyroid Function Tests: No results for input(s): TSH, T4TOTAL, FREET4, T3FREE, THYROIDAB in the last 72 hours. Anemia Panel:  Recent Labs  12/02/15 0526  FERRITIN 59  TIBC 258  IRON 57   Urine analysis:     Component Value Date/Time   COLORURINE RED (A) 11/21/2015 1512   APPEARANCEUR TURBID (A) 11/21/2015 1512   LABSPEC 1.013 11/21/2015 1512   PHURINE 6.0 11/21/2015 1512   GLUCOSEU NEGATIVE 11/21/2015 1512   HGBUR LARGE (A) 11/21/2015 1512   HGBUR negative 04/25/2008 0833   BILIRUBINUR MODERATE (A) 11/21/2015 1512   BILIRUBINUR 2+ 08/03/2011 1347   KETONESUR 15 (A) 11/21/2015 1512   PROTEINUR >300 (A) 11/21/2015 1512   UROBILINOGEN 1.0 11/03/2014 2035   NITRITE POSITIVE (A) 11/21/2015 1512   LEUKOCYTESUR LARGE (A) 11/21/2015 1512   Sepsis Labs: Invalid input(s): PROCALCITONIN, LACTICIDVEN  Recent Results (from the past 240 hour(s))  Culture, body fluid-bottle     Status: None   Collection Time: 11/24/15  3:37 PM  Result Value Ref Range Status   Specimen Description FLUID PERITONEAL  Final   Special Requests BOTTLES DRAWN AEROBIC AND ANAEROBIC 10CC  Final   Culture   Final    NO GROWTH 5 DAYS Performed at [facility]    Report Status 11/29/2015 FINAL  Final  Gram stain     Status: None   Collection Time: 11/24/15  3:37 PM  Result Value Ref Range Status   Specimen Description FLUID PERITONEAL  Final   Special Requests NONE  Final   Gram Stain   Final    RARE WBC PRESENT, PREDOMINANTLY MONONUCLEAR FEW RED BLOOD CELLS NO ORGANISMS SEEN Performed at [facility]    Report Status 11/24/2015 FINAL  Final      Radiology Studies: No results found.   Scheduled Meds: . sodium chloride   Intravenous Once  . darbepoetin (ARANESP) injection - NON-DIALYSIS  100 mcg Subcutaneous Q Tue-1800  . folic acid  1 mg Oral Daily  . furosemide  60 mg Intravenous BID  . lactulose  10 g Oral TID  . levothyroxine  50 mcg Oral QAC breakfast  .  multivitamin with minerals  1 tablet Oral Daily  . nadolol  40 mg Oral Daily  . pantoprazole  40 mg Oral BID AC  . senna-docusate  2 tablet Oral QHS  . thiamine  100 mg Oral Daily   Or  . thiamine  100 mg Intravenous Daily  .  venlafaxine XR  150 mg Oral Q breakfast   Continuous Infusions:    Marzetta Board, MD, PhD [facility] Pager [phone]  If 7PM-7AM, please contact night-coverage [email or web address] Password [email or web address] 12/02/2015, 9:57 AM

## 2015-12-03 ENCOUNTER — Inpatient Hospital Stay (HOSPITAL_COMMUNITY): Payer: BLUE CROSS/BLUE SHIELD | Admitting: Anesthesiology

## 2015-12-03 ENCOUNTER — Encounter (HOSPITAL_COMMUNITY)
Admission: EM | Disposition: A | Discharge status: Home / Self Care | Payer: Self-pay | Source: Home / Self Care | Attending: Family Medicine

## 2015-12-03 ENCOUNTER — Encounter (HOSPITAL_COMMUNITY): Payer: Self-pay

## 2015-12-03 DIAGNOSIS — K259 Gastric ulcer, unspecified as acute or chronic, without hemorrhage or perforation: Secondary | ICD-10-CM

## 2015-12-03 DIAGNOSIS — I8500 Esophageal varices without bleeding: Secondary | ICD-10-CM

## 2015-12-03 DIAGNOSIS — D649 Anemia, unspecified: Secondary | ICD-10-CM | POA: Diagnosis not present

## 2015-12-03 HISTORY — PX: ESOPHAGOGASTRODUODENOSCOPY (EGD) WITH PROPOFOL: SHX5813

## 2015-12-03 LAB — BASIC METABOLIC PANEL
Anion gap: 10 (ref 5–15)
BUN: 65 mg/dL — AB (ref 6–20)
CHLORIDE: 98 mmol/L — AB (ref 101–111)
CO2: 26 mmol/L (ref 22–32)
Calcium: 8.5 mg/dL — ABNORMAL LOW (ref 8.9–10.3)
Creatinine, Ser: 4.54 mg/dL — ABNORMAL HIGH (ref 0.61–1.24)
GFR calc non Af Amer: 13 mL/min — ABNORMAL LOW (ref >60)
GFR, EST AFRICAN AMERICAN: 15 mL/min — AB (ref >60)
Glucose, Bld: 83 mg/dL (ref 65–99)
POTASSIUM: 3.6 mmol/L (ref 3.5–5.1)
SODIUM: 134 mmol/L — AB (ref 135–145)

## 2015-12-03 LAB — AMMONIA: AMMONIA: 47 umol/L — AB (ref 9–35)

## 2015-12-03 LAB — CBC
HEMATOCRIT: 24 % — AB (ref 39.0–52.0)
HEMOGLOBIN: 8 g/dL — AB (ref 13.0–17.0)
MCH: 31.6 pg (ref 26.0–34.0)
MCHC: 33.3 g/dL (ref 30.0–36.0)
MCV: 94.9 fL (ref 78.0–100.0)
Platelets: 121 10*3/uL — ABNORMAL LOW (ref 150–400)
RBC: 2.53 MIL/uL — AB (ref 4.22–5.81)
RDW: 17.3 % — ABNORMAL HIGH (ref 11.5–15.5)
WBC: 5.2 10*3/uL (ref 4.0–10.5)

## 2015-12-03 SURGERY — ESOPHAGOGASTRODUODENOSCOPY (EGD) WITH PROPOFOL
Anesthesia: Monitor Anesthesia Care

## 2015-12-03 MED ORDER — ACETAMINOPHEN 650 MG RE SUPP: 325.0000 mg | Freq: Four times a day (QID) | RECTAL | Status: DC | PRN

## 2015-12-03 MED ORDER — LACTATED RINGERS IV SOLN
INTRAVENOUS | Status: DC
  Administered 2015-12-03: 09:00:00 via INTRAVENOUS

## 2015-12-03 MED ORDER — LIDOCAINE HCL (CARDIAC) 20 MG/ML IV SOLN
INTRAVENOUS | Status: AC
  Filled 2015-12-03: qty 5

## 2015-12-03 MED ORDER — PROPOFOL 10 MG/ML IV BOLUS
INTRAVENOUS | Status: AC
  Filled 2015-12-03: qty 20

## 2015-12-03 MED ORDER — ACETAMINOPHEN 325 MG PO TABS: 325.0000 mg | ORAL_TABLET | Freq: Four times a day (QID) | ORAL | Status: DC | PRN

## 2015-12-03 MED ORDER — PROPOFOL 500 MG/50ML IV EMUL
INTRAVENOUS | Status: DC | PRN
  Administered 2015-12-03: 140 ug/kg/min via INTRAVENOUS

## 2015-12-03 MED ORDER — PROPOFOL 10 MG/ML IV BOLUS
INTRAVENOUS | Status: DC | PRN
  Administered 2015-12-03 (×3): 20 mg via INTRAVENOUS

## 2015-12-03 MED ORDER — MIDODRINE HCL 5 MG PO TABS
10.0000 mg | ORAL_TABLET | Freq: Two times a day (BID) | ORAL | Status: DC
  Administered 2015-12-03 – 2015-12-06 (×6): 10 mg via ORAL
  Filled 2015-12-03 (×8): qty 2

## 2015-12-03 MED ORDER — PHENYLEPHRINE HCL 10 MG/ML IJ SOLN
INTRAMUSCULAR | Status: DC | PRN
  Administered 2015-12-03: 80 ug via INTRAVENOUS

## 2015-12-03 MED ORDER — FUROSEMIDE 40 MG PO TABS
80.0000 mg | ORAL_TABLET | Freq: Two times a day (BID) | ORAL | Status: DC
  Administered 2015-12-03 – 2015-12-06 (×6): 80 mg via ORAL
  Filled 2015-12-03 (×6): qty 2

## 2015-12-03 SURGICAL SUPPLY — 15 items

## 2015-12-03 NOTE — Progress Notes (Signed)
Subjective:  less UOP- only 705 recorded - creatinine and BUN stable.  Had EGD- varices, gastric ulcer and gastritis Objective Vital signs in last 24 hours: Vitals:   12/03/15 0520 12/03/15 0834 12/03/15 0933 12/03/15 0940  BP: (!) 105/49 (!) 142/69 (!) 95/40 (!) 95/38  Pulse: 71 71 64   Resp: 20 15 12    Temp: 97.9 F (36.6 C) 97.7 F (36.5 C)    TempSrc: Oral Oral Oral   SpO2: 99% 100% 100%   Weight: 107.2 kg (236 lb 4.8 oz) 107 kg (236 lb)    Height:  5\' 11"  (1.803 m)     Weight change: 0.68 kg (1 lb 8 oz)  Intake/Output Summary (Last 24 hours) at 12/03/15 1403 Last data filed at 12/03/15 [id number]  Gross per 24 hour  Intake             1260 ml  Output              480 ml  Net              780 ml    Assessment/ Plan: Pt is a 55 y.o. yo male with history of AKI who was admitted on 11/20/2015 with recurrent AKI in the setting of cirrhosis/low BP   Assessment/Plan: 1. AKI- creatinine 0.8 in September 2016- had hematuria but all serologies negative- ultrasound shows some asymetry to kidneys- fortunately with supportive care renal function has started to improve so seemed to be c/w ATN- creatinine peaked in the high 6's - now down to 4.6- nonoliguric- no indications for HD.  I am slightly concerned at plateau of function 2. Volume- third spacing due to liver dz and hypoalbuminemia- making urine on moderated lasix- will change to PO.  I also will add midodrine to see if can get a little more kidney perfusion pressure happening  3. Anemia- significant issue- giving ESA  iron stores OK- consider transfusion-  EGD results as above- on PPI- hgb stable  4. Hypokalemia- on repletion- to continue 5. Dispo-will change lasix to PO - hopefully will continue to see improvement    Austin Padilla A    Labs: Basic Metabolic Panel:  Recent Labs Lab 12/01/15 0520 12/02/15 0526 12/03/15 0539  NA 135 134* 134*  K 3.9 3.6 3.6  CL 100* 98* 98*  CO2 26 25 26   GLUCOSE 83 79 83  BUN 76* 69*  65*  CREATININE 5.03* 4.60* 4.54*  CALCIUM 8.4* 8.4* 8.5*  PHOS  --  3.6  --    Liver Function Tests:  Recent Labs Lab 11/27/15 0456 11/30/15 0523 12/02/15 0526  AST 25 31  --   ALT 10* 11*  --   ALKPHOS 102 103  --   BILITOT 1.1 1.2  --   PROT 5.8* 6.5  --   ALBUMIN 2.6* 2.8* 2.8*   No results for input(s): LIPASE, AMYLASE in the last 168 hours.  Recent Labs Lab 12/03/15 1046  AMMONIA 47*   CBC:  Recent Labs Lab 11/29/15 0545 11/30/15 0523 12/01/15 0520 12/02/15 0526 12/03/15 0539  WBC 3.7* 4.0 4.5 4.3 5.2  HGB 7.6* 7.7* 7.7* 8.0* 8.0*  HCT 23.1* 22.6* 23.1* 24.0* 24.0*  MCV 95.5 95.4 95.5 94.9 94.9  PLT 82* 82* 96* 111* 121*   Cardiac Enzymes: No results for input(s): CKTOTAL, CKMB, CKMBINDEX, TROPONINI in the last 168 hours. CBG: No results for input(s): GLUCAP in the last 168 hours.  Iron Studies:   Recent Labs  12/02/15 0526  IRON  57  TIBC 258  FERRITIN 59   Studies/Results: No results found. Medications: Infusions:    Scheduled Medications: . sodium chloride   Intravenous Once  . darbepoetin (ARANESP) injection - NON-DIALYSIS  100 mcg Subcutaneous Q Tue-1800  . folic acid  1 mg Oral Daily  . furosemide  60 mg Intravenous BID  . lactulose  10 g Oral TID  . levothyroxine  50 mcg Oral QAC breakfast  . multivitamin with minerals  1 tablet Oral Daily  . nadolol  40 mg Oral Daily  . pantoprazole  40 mg Oral BID AC  . senna-docusate  2 tablet Oral QHS  . thiamine  100 mg Oral Daily   Or  . thiamine  100 mg Intravenous Daily  . venlafaxine XR  150 mg Oral Q breakfast    have reviewed scheduled and prn medications.  Physical Exam: General: a little slow Heart: RRR Lungs: mostly clear Abdomen: distended Extremities: pitting edema    12/03/2015,2:03 PM  LOS: 13 days

## 2015-12-03 NOTE — Anesthesia Postprocedure Evaluation (Signed)
Anesthesia Post Note  Patient: Austin Padilla.  Procedure(s) Performed: Procedure(s) (LRB): ESOPHAGOGASTRODUODENOSCOPY (EGD) WITH PROPOFOL (N/A)  Patient location during evaluation: PACU Anesthesia Type: MAC Level of consciousness: awake and alert Pain management: pain level controlled Vital Signs Assessment: post-procedure vital signs reviewed and stable Respiratory status: spontaneous breathing, nonlabored ventilation, respiratory function stable and patient connected to nasal cannula oxygen Cardiovascular status: stable and blood pressure returned to baseline Anesthetic complications: no    Last Vitals:  Vitals:   12/03/15 0933 12/03/15 0940  BP: (!) 95/40 (!) 95/38  Pulse: 64   Resp: 12   Temp:      Last Pain:  Vitals:   12/03/15 1315  TempSrc:   PainSc: 0-No pain                 Joss Mcdill L

## 2015-12-03 NOTE — Interval H&P Note (Signed)
History and Physical Interval Note:  12/03/2015 9:01 AM  Austin Padilla.  has presented today for surgery, with the diagnosis of cirrhosis , anemia  The various methods of treatment have been discussed with the patient and family. After consideration of risks, benefits and other options for treatment, the patient has consented to  Procedure(s): ESOPHAGOGASTRODUODENOSCOPY (EGD) WITH PROPOFOL (N/A) as a surgical intervention .  The patient's history has been reviewed, patient examined, no change in status, stable for surgery.  I have reviewed the patient's chart and labs.  Questions were answered to the patient's satisfaction.     Renelda Loma Dimitria Ketchum

## 2015-12-03 NOTE — Op Note (Signed)
[facility] Patient Name: Austin Padilla Procedure Date: 12/03/2015 MRN: [id number] Attending MD: Carlota Raspberry. Havery Moros , MD Date of Birth: 18-Dec-1960 CSN: [id number] Age: 55 Admit Type: Inpatient Procedure:                Upper GI endoscopy Indications:              Suspected upper gastrointestinal bleeding in                            patient with chronic blood loss, history of                            cirrhosis Providers:                Remo Lipps P. Havery Moros, MD, Cleda Daub, RN, Elspeth Cho Tech., Technician, Danley Danker, CRNA Referring MD:              Medicines:                Monitored Anesthesia Care Complications:            No immediate complications. Estimated blood loss:                            Minimal. Estimated Blood Loss:     Estimated blood loss was minimal. Procedure:                Pre-Anesthesia Assessment:                           - Prior to the procedure, a History and Physical                            was performed, and patient medications and                            allergies were reviewed. The patient's tolerance of                            previous anesthesia was also reviewed. The risks                            and benefits of the procedure and the sedation                            options and risks were discussed with the patient.                            All questions were answered, and informed consent                            was obtained. Prior Anticoagulants: The patient has                            taken  no previous anticoagulant or antiplatelet                            agents. ASA Grade Assessment: III - A patient with                            severe systemic disease. After reviewing the risks                            and benefits, the patient was deemed in                            satisfactory condition to undergo the procedure.                           After obtaining  informed consent, the endoscope was                            passed under direct vision. Throughout the                            procedure, the patient's blood pressure, pulse, and                            oxygen saturations were monitored continuously. The                            [id number] scope was introduced through the                            mouth, and advanced to the second part of duodenum.                            The upper GI endoscopy was accomplished without                            difficulty. The patient tolerated the procedure                            well. Scope In: Scope Out: Findings:      Esophagogastric landmarks were identified: the gastroesophageal junction       was found at 45 cm and the upper extent of the gastric folds was found       at 45 cm from the incisors.      One column of small (< 5 mm) varices were found in the lower third of       the esophagus, which flattened with insufflation. No high risk stigmata       of bleeding was appreciated.      The exam of the esophagus was otherwise normal.      One non-bleeding superficial gastric ulcer with no stigmata of bleeding       was found in the gastric antrum, superior to the pylorus. The lesion was       4 mm in largest dimension. Biopsies were taken from the  periphery of the       lesion with a cold forceps for histology.      Diffuse inflammation characterized by congestion (edema), erythema and       granularity was found in the entire examined stomach, consistent with       portal hypertensive gastropathy. Biopsies were taken with a cold forceps       for Helicobacter pylori testing.      The exam of the stomach was otherwise normal. No gastric varices noted.      The duodenal bulb and second portion of the duodenum were normal. Impression:               - Esophagogastric landmarks identified.                           - One column of small (< 5 mm) esophageal varices                             which flattened with insufflation, no stigmata of                            bleeding noted                           - One small Non-bleeding gastric ulcer with no                            stigmata of bleeding. Biopsied. This may be the                            source of the patient's anemia along with suspected                            portal hypertensive gastropathy.                           - Gastritis due to suspected portal hypertensive                            gastropathy. Biopsied to rule out H pylori                           - Normal duodenal bulb and second portion of the                            duodenum. Moderate Sedation:      No moderate sedation, case performed with MAC Recommendation:           - Return patient to hospital ward for ongoing care.                           - Resume regular diet.                           - Continue present medications (protonix twice  daily) and nadolol                           - NO NSAIDs                           - Await pathology results, treat for H pylori if                            positive Procedure Code(s):        --- Professional ---                           [phone], Esophagogastroduodenoscopy, flexible,                            transoral; with biopsy, single or multiple Diagnosis Code(s):        --- Professional ---                           I85.00, Esophageal varices without bleeding                           K25.9, Gastric ulcer, unspecified as acute or                            chronic, without hemorrhage or perforation                           K29.70, Gastritis, unspecified, without bleeding                           R58, Hemorrhage, not elsewhere classified CPT copyright 2016 [employer]. All rights reserved. The codes documented in this report are preliminary and upon coder review may  be revised to meet current compliance requirements. Remo Lipps P.  Armbruster, MD 12/03/2015 9:32:53 AM This report has been signed electronically. Number of Addenda: 0

## 2015-12-03 NOTE — Transfer of Care (Signed)
Immediate Anesthesia Transfer of Care Note  Patient: Austin Padilla.  Procedure(s) Performed: Procedure(s): ESOPHAGOGASTRODUODENOSCOPY (EGD) WITH PROPOFOL (N/A)  Patient Location: Endoscopy Unit  Anesthesia Type:MAC  Level of Consciousness: sedated  Airway & Oxygen Therapy: Patient Spontanous Breathing and Patient connected to nasal cannula oxygen  Post-op Assessment: Report given to RN and Post -op Vital signs reviewed and stable  Post vital signs: Reviewed and stable  Last Vitals:  Vitals:   12/03/15 0520 12/03/15 0834  BP: (!) 105/49 (!) 142/69  Pulse: 71 71  Resp: 20 15  Temp: 36.6 C 36.5 C    Last Pain:  Vitals:   12/03/15 0834  TempSrc: Oral  PainSc:       Patients Stated Pain Goal: 0 ([id number])  Complications: No apparent anesthesia complications

## 2015-12-03 NOTE — H&P (View-Only) (Signed)
Patient ID: Austin Glass., male   DOB: 1961/04/28, 55 y.o.   MRN: [id number]    Progress Note   Subjective   No current c/o hgb 8  Stable  creat 4.6   Objective   Vital signs in last 24 hours: Temp:  [97.9 F (36.6 C)-98.1 F (36.7 C)] 98.1 F (36.7 C) (08/01 0607) Pulse Rate:  [68-69] 69 (08/01 0607) Resp:  [16-18] 16 (08/01 0607) BP: (104-129)/(51-67) 129/64 (08/01 0607) SpO2:  [99 %-100 %] 100 % (08/01 0607) Weight:  [234 lb 12.8 oz (106.5 kg)] 234 lb 12.8 oz (106.5 kg) (08/01 0607) Last BM Date: 12/01/15 General:    white male  in NAD Heart:  Regular rate and rhythm; no murmurs Lungs: Respirations even and unlabored, lungs CTA bilaterally Abdomen:  Soft, large nontender  Extremities:  Without edema. Neurologic:  Alert and oriented,  grossly normal neurologically. Psych:  Cooperative. Normal mood and affect.  Intake/Output from previous day: 07/31 0701 - 08/01 0700 In: 446 [P.O.:446] Out: 1850 [Urine:1850] Intake/Output this shift: Total I/O In: -  Out: 225 [Urine:225]  Lab Results:  Recent Labs  11/30/15 0523 12/01/15 0520 12/02/15 0526  WBC 4.0 4.5 4.3  HGB 7.7* 7.7* 8.0*  HCT 22.6* 23.1* 24.0*  PLT 82* 96* 111*   BMET  Recent Labs  11/30/15 0523 12/01/15 0520 12/02/15 0526  NA 136 135 134*  K 3.5 3.9 3.6  CL 101 100* 98*  CO2 24 26 25   GLUCOSE 91 83 79  BUN 68* 76* 69*  CREATININE 5.20* 5.03* 4.60*  CALCIUM 8.1* 8.4* 8.4*   LFT  Recent Labs  11/30/15 0523 12/02/15 0526  PROT 6.5  --   ALBUMIN 2.8* 2.8*  AST 31  --   ALT 11*  --   ALKPHOS 103  --   BILITOT 1.2  --    PT/INR No results for input(s): LABPROT, INR in the last 72 hours.  Studies/Results: No results found.     Assessment / Plan:    #1 55 yo WM alcoholic with cirrhosis admitted 7/21 with ascites, MELD 30 , and acute renal failure  EGD has been on hold  Due to renal failure- no active bleeding, needs variceal screening. Pt has been stable though kidney  function not improving much. Hospitalist  has asked that we go ahead with EGD  Plan; EGD scheduled for 8:30 am tomorrow with Dr Havery Moros- procedure discussed in detail l with pt and he is agreeable to proceed. 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

## 2015-12-03 NOTE — Progress Notes (Addendum)
PROGRESS NOTE  Austin Padilla. [id number] DOB: Jul 12, 1960 DOA: 11/20/2015 PCP: Joycelyn Man, MD   LOS: 13 days   Brief Narrative: Austin Padilla. is a 55 y.o. 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 The patient was in his usual state of health until about one month ago when he started service decreased appetite, increased swelling in the belly, and weakness. The weakness has progressed to be severe, so that he needs a cane, cannot stand in the kitchen long enough to make himself dinner, and gets short of breath with exertion. Now over the last 2 weeks he has had dark urine, oliguria, and marked pedal edema. He went to his PCP, who recommended he be evaluated at the hospital.  Patient was admitted, and gastroenterology and nephrology were consulted following patient in the hospital. He underwent paracentesis with 5 L of fluid removal, and was placed on IV diuresis, his renal function initially got worse and hemodialysis was entertained, however he recovered and he is not improving. EGD is planned for tomorrow 8/2  Assessment & Plan: Principal Problem:   Acute renal failure ([place]) Active Problems:   Essential hypertension   Alcoholic cirrhosis (HCC)   Alcohol use disorder, moderate, dependence (HCC)   Ascites   Anemia, unspecified   Hyponatremia   Thrombocytopenia (HCC)   Acute kidney injury ([place])   Hyperkalemia   Acute kidney failure, with hyperkalemia  - Hyperkalemia resolved now.  Following renal function panel.  - His creatinine increased up to 6.8, plateau for couple days, improving, repeat in AM. - no need for HD currently. Continue Lasix. Discussed with Dr. Moshe Cipro. - Elevated urine sodium, unlikely hepatorenal syndrome, etiology not clear - autoimmune workup non diagnostic, ANA negative, MPO/Pr-3 negative, C3 mildly low, mornal  C4 - Management  and conversion to by mouth Lasix per nephrology   Anasarca - IV Lasix per nephrology. Replete potassium as needed - daily weights being followed, has been diuresing.    Anemia of Chronic Disease:  - s/p 2 units PRBC, hemoglobin improved but trending down again on 7/27 status post 1 unit of packed red blood cell. Hemoglobin improved appropriately to 8.1 on 7/28 and has remained stable since.  No need for transfusion but closely monitor - No active GI bleeding found. Transfuse as needed.   - GI following, and they plan on EGD prior to patient's discharge, close to discharge. Given improvement in his renal function, I anticipate discharge by the end of the week, discussed with GI today, EGD 12/03/15: mild varices, portal gastropathy, no active bleeding.   Liver cirrhosis with ascites  - Liver disease due to alcohol abuse - Status post paracentesis 2, 7/21 for diagnostic purposes and 7/24 which was large volume, followed by albumin - He has ascites on exam, however is not tense, does not need a repeat paracentesis for now. Monitor while getting Lasix. - Gastroenterology was consulted and following patient, - elevated ammonia, started on lactulose. Mental status remains clear. Recheck NH3 in AM.   Constipation - no BM in 20 days per patient - Received enema on 7/31 with excellent results  - Lactulose ordered TID with good results  Alcohol use disorder - CIWA protocol, no s/s of withdrawal.   Hyponatremia - Hypervolemic, from liver disease.  - Now within normal limits. Monitor while getting Lasix.  - 134 this morning  HTN - Hypertensive at admission. -  Continue nadolol - controlled well.   Thrombocytopenia - From portal hypertension and cirrhosis, chronic alcoholism - Continues to improve, platelets 111 today   Hypothyroidism - Continue levothyroxine - Most recent TSH 7/20, 4.78  GERD - Continue PPI  Chronic Alcoholism - Counseled for cessation   UTI   - completed treatment with ceftriaxone IV.   DVT prophylaxis: SCD  Code Status: Full code  Family Communication: No family at bedside  Disposition Plan: Hopefully home in 1-2 days   Consultants:   Nephrology  Gastroenterology  Procedures:   None   Antimicrobials:  Ceftriaxone 7/21 >> 7/25  Subjective: - Pt tolerated EGD.     Objective: Vitals:   12/03/15 0520 12/03/15 0834 12/03/15 0933 12/03/15 0940  BP: (!) 105/49 (!) 142/69 (!) 95/40 (!) 95/38  Pulse: 71 71 64   Resp: 20 15 12    Temp: 97.9 F (36.6 C) 97.7 F (36.5 C)    TempSrc: Oral Oral Oral   SpO2: 99% 100% 100%   Weight: 107.2 kg (236 lb 4.8 oz) 107 kg (236 lb)    Height:  5\' 11"  (1.803 m)      Intake/Output Summary (Last 24 hours) at 12/03/15 1342 Last data filed at 12/03/15 [id number]  Gross per 24 hour  Intake             1260 ml  Output              480 ml  Net              780 ml   Filed Weights   12/02/15 0607 12/03/15 0520 12/03/15 0834  Weight: 106.5 kg (234 lb 12.8 oz) 107.2 kg (236 lb 4.8 oz) 107 kg (236 lb)    Examination: Constitutional: NAD Vitals:   12/03/15 0520 12/03/15 0834 12/03/15 0933 12/03/15 0940  BP: (!) 105/49 (!) 142/69 (!) 95/40 (!) 95/38  Pulse: 71 71 64   Resp: 20 15 12    Temp: 97.9 F (36.6 C) 97.7 F (36.5 C)    TempSrc: Oral Oral Oral   SpO2: 99% 100% 100%   Weight: 107.2 kg (236 lb 4.8 oz) 107 kg (236 lb)    Height:  5\' 11"  (1.803 m)     ENMT: Mucous membranes are moist.  Respiratory: clear to auscultation bilaterally, no wheezing, no crackles. Normal respiratory effort. No accessory muscle use.  Cardiovascular: Regular rate and rhythm, no murmurs / rubs / gallops. 2+ pitting LE edema.  Abdomen: no tenderness. Bowel sounds positive. Ascites present, not tense Musculoskeletal: no clubbing / cyanosis.  Skin: no rashes, lesions, ulcers. No induration Neurologic: non focal    Data Reviewed: I have personally reviewed following labs and imaging  studies  CBC:  Recent Labs Lab 11/29/15 0545 11/30/15 0523 12/01/15 0520 12/02/15 0526 12/03/15 0539  WBC 3.7* 4.0 4.5 4.3 5.2  HGB 7.6* 7.7* 7.7* 8.0* 8.0*  HCT 23.1* 22.6* 23.1* 24.0* 24.0*  MCV 95.5 95.4 95.5 94.9 94.9  PLT 82* 82* 96* 111* [id number]*   Basic Metabolic Panel:  Recent Labs Lab 11/29/15 0545 11/30/15 0523 12/01/15 0520 12/02/15 0526 12/03/15 0539  NA 135 136 135 134* 134*  K 3.7 3.5 3.9 3.6 3.6  CL 102 101 100* 98* 98*  CO2 24 24 26 25 26   GLUCOSE 81 91 83 79 83  BUN 69* 68* 76* 69* 65*  CREATININE 5.85* 5.20* 5.03* 4.60* 4.54*  CALCIUM 7.8* 8.1* 8.4* 8.4* 8.5*  PHOS  --   --   --  3.6  --    GFR: Estimated Creatinine Clearance: 22.9 mL/min (by C-G formula based on SCr of 4.54 mg/dL). Liver Function Tests:  Recent Labs Lab 11/27/15 0456 11/30/15 0523 12/02/15 0526  AST 25 31  --   ALT 10* 11*  --   ALKPHOS 102 103  --   BILITOT 1.1 1.2  --   PROT 5.8* 6.5  --   ALBUMIN 2.6* 2.8* 2.8*   No results for input(s): LIPASE, AMYLASE in the last 168 hours.  Recent Labs Lab 12/03/15 1046  AMMONIA 47*   Coagulation Profile: No results for input(s): INR, PROTIME in the last 168 hours. Cardiac Enzymes: No results for input(s): CKTOTAL, CKMB, CKMBINDEX, TROPONINI in the last 168 hours. BNP (last 3 results) No results for input(s): PROBNP in the last 8760 hours. HbA1C: No results for input(s): HGBA1C in the last 72 hours. CBG: No results for input(s): GLUCAP in the last 168 hours. Lipid Profile: No results for input(s): CHOL, HDL, LDLCALC, TRIG, CHOLHDL, LDLDIRECT in the last 72 hours. Thyroid Function Tests: No results for input(s): TSH, T4TOTAL, FREET4, T3FREE, THYROIDAB in the last 72 hours. Anemia Panel:  Recent Labs  12/02/15 0526  FERRITIN 59  TIBC 258  IRON 57   Urine analysis:    Component Value Date/Time   COLORURINE RED (A) 11/21/2015 1512   APPEARANCEUR TURBID (A) 11/21/2015 1512   LABSPEC 1.013 11/21/2015 1512   PHURINE  6.0 11/21/2015 1512   GLUCOSEU NEGATIVE 11/21/2015 1512   HGBUR LARGE (A) 11/21/2015 1512   HGBUR negative 04/25/2008 0833   BILIRUBINUR MODERATE (A) 11/21/2015 1512   BILIRUBINUR 2+ 08/03/2011 1347   KETONESUR 15 (A) 11/21/2015 1512   PROTEINUR >300 (A) 11/21/2015 1512   UROBILINOGEN 1.0 11/03/2014 2035   NITRITE POSITIVE (A) 11/21/2015 1512   LEUKOCYTESUR LARGE (A) 11/21/2015 1512   Sepsis Labs: Invalid input(s): PROCALCITONIN, LACTICIDVEN  Recent Results (from the past 240 hour(s))  Culture, body fluid-bottle     Status: None   Collection Time: 11/24/15  3:37 PM  Result Value Ref Range Status   Specimen Description FLUID PERITONEAL  Final   Special Requests BOTTLES DRAWN AEROBIC AND ANAEROBIC 10CC  Final   Culture   Final    NO GROWTH 5 DAYS Performed at [facility]    Report Status 11/29/2015 FINAL  Final  Gram stain     Status: None   Collection Time: 11/24/15  3:37 PM  Result Value Ref Range Status   Specimen Description FLUID PERITONEAL  Final   Special Requests NONE  Final   Gram Stain   Final    RARE WBC PRESENT, PREDOMINANTLY MONONUCLEAR FEW RED BLOOD CELLS NO ORGANISMS SEEN Performed at [facility]    Report Status 11/24/2015 FINAL  Final      Radiology Studies: No results found.   Scheduled Meds: . sodium chloride   Intravenous Once  . darbepoetin (ARANESP) injection - NON-DIALYSIS  100 mcg Subcutaneous Q Tue-1800  . folic acid  1 mg Oral Daily  . furosemide  60 mg Intravenous BID  . lactulose  10 g Oral TID  . levothyroxine  50 mcg Oral QAC breakfast  . multivitamin with minerals  1 tablet Oral Daily  . nadolol  40 mg Oral Daily  . pantoprazole  40 mg Oral BID AC  . senna-docusate  2 tablet Oral QHS  . thiamine  100 mg Oral Daily   Or  . thiamine  100 mg Intravenous Daily  .  venlafaxine XR  150 mg Oral Q breakfast   Continuous Infusions:    Irwin Brakeman, MD [facility] Pager [phone]  If 7PM-7AM, please  contact night-coverage [email or web address] Password [email or web address] 12/03/2015, 1:42 PM

## 2015-12-03 NOTE — Anesthesia Preprocedure Evaluation (Signed)
Anesthesia Evaluation  Patient identified by MRN, date of birth, ID band Patient awake    Reviewed: Allergy & Precautions, H&P , NPO status , Patient's Chart, lab work & pertinent test results, reviewed documented beta blocker date and time   Airway Mallampati: II  TM Distance: >3 FB Neck ROM: full    Dental no notable dental hx. (+) Dental Advisory Given, Teeth Intact   Pulmonary Current Smoker,    Pulmonary exam normal breath sounds clear to auscultation       Cardiovascular hypertension, Pt. on home beta blockers Normal cardiovascular exam Rhythm:regular Rate:Normal     Neuro/Psych Anxiety Depression negative neurological ROS     GI/Hepatic negative GI ROS, GERD  Medicated,(+) Cirrhosis     substance abuse  alcohol use,   Endo/Other  Hypothyroidism   Renal/GU CRFRenal diseaseCRT 4.54. BUN 65  negative genitourinary   Musculoskeletal   Abdominal   Peds  Hematology negative hematology ROS (+) anemia , hgb 8   Anesthesia Other Findings   Reproductive/Obstetrics negative OB ROS                             Anesthesia Physical Anesthesia Plan  ASA: IV  Anesthesia Plan: MAC   Post-op Pain Management:    Induction:   Airway Management Planned:   Additional Equipment:   Intra-op Plan:   Post-operative Plan:   Informed Consent: I have reviewed the patients History and Physical, chart, labs and discussed the procedure including the risks, benefits and alternatives for the proposed anesthesia with the patient or authorized representative who has indicated his/her understanding and acceptance.   Dental Advisory Given  Plan Discussed with: CRNA  Anesthesia Plan Comments:         Anesthesia Quick Evaluation

## 2015-12-04 ENCOUNTER — Encounter (HOSPITAL_COMMUNITY): Payer: Self-pay | Admitting: Gastroenterology

## 2015-12-04 DIAGNOSIS — I8510 Secondary esophageal varices without bleeding: Secondary | ICD-10-CM | POA: Diagnosis present

## 2015-12-04 DIAGNOSIS — K7030 Alcoholic cirrhosis of liver without ascites: Secondary | ICD-10-CM

## 2015-12-04 DIAGNOSIS — M545 Low back pain, unspecified: Secondary | ICD-10-CM | POA: Diagnosis present

## 2015-12-04 DIAGNOSIS — E7220 Disorder of urea cycle metabolism, unspecified: Secondary | ICD-10-CM | POA: Clinically undetermined

## 2015-12-04 LAB — CBC
HCT: 22.6 % — ABNORMAL LOW (ref 39.0–52.0)
HEMOGLOBIN: 7.5 g/dL — AB (ref 13.0–17.0)
MCH: 31.5 pg (ref 26.0–34.0)
MCHC: 33.2 g/dL (ref 30.0–36.0)
MCV: 95 fL (ref 78.0–100.0)
PLATELETS: 123 10*3/uL — AB (ref 150–400)
RBC: 2.38 MIL/uL — ABNORMAL LOW (ref 4.22–5.81)
RDW: 17 % — ABNORMAL HIGH (ref 11.5–15.5)
WBC: 5.1 10*3/uL (ref 4.0–10.5)

## 2015-12-04 LAB — RENAL FUNCTION PANEL
ALBUMIN: 2.9 g/dL — AB (ref 3.5–5.0)
Anion gap: 10 (ref 5–15)
BUN: 75 mg/dL — AB (ref 6–20)
CO2: 25 mmol/L (ref 22–32)
Calcium: 8.4 mg/dL — ABNORMAL LOW (ref 8.9–10.3)
Chloride: 99 mmol/L — ABNORMAL LOW (ref 101–111)
Creatinine, Ser: 4.23 mg/dL — ABNORMAL HIGH (ref 0.61–1.24)
GFR, EST AFRICAN AMERICAN: 17 mL/min — AB (ref >60)
GFR, EST NON AFRICAN AMERICAN: 14 mL/min — AB (ref >60)
Glucose, Bld: 101 mg/dL — ABNORMAL HIGH (ref 65–99)
PHOSPHORUS: 3.8 mg/dL (ref 2.5–4.6)
POTASSIUM: 3.6 mmol/L (ref 3.5–5.1)
Sodium: 134 mmol/L — ABNORMAL LOW (ref 135–145)

## 2015-12-04 LAB — HEMOGLOBIN AND HEMATOCRIT, BLOOD
HCT: 26.1 % — ABNORMAL LOW (ref 39.0–52.0)
HEMOGLOBIN: 8.7 g/dL — AB (ref 13.0–17.0)

## 2015-12-04 LAB — PREPARE RBC (CROSSMATCH)

## 2015-12-04 LAB — AMMONIA: AMMONIA: 95 umol/L — AB (ref 9–35)

## 2015-12-04 MED ORDER — SODIUM CHLORIDE 0.9 % IV SOLN
Freq: Once | INTRAVENOUS | Status: AC
  Administered 2015-12-04: 13:00:00 via INTRAVENOUS

## 2015-12-04 MED ORDER — LIDOCAINE 5 % EX PTCH
1.0000 | MEDICATED_PATCH | CUTANEOUS | Status: DC
  Filled 2015-12-04 (×3): qty 1

## 2015-12-04 MED ORDER — OXYCODONE HCL 5 MG PO TABS
5.0000 mg | ORAL_TABLET | Freq: Four times a day (QID) | ORAL | Status: DC | PRN
  Administered 2015-12-04 – 2015-12-05 (×3): 5 mg via ORAL
  Filled 2015-12-04 (×3): qty 1

## 2015-12-04 MED ORDER — FUROSEMIDE 10 MG/ML IJ SOLN
20.0000 mg | Freq: Once | INTRAMUSCULAR | Status: AC
  Administered 2015-12-04: 20 mg via INTRAVENOUS
  Filled 2015-12-04: qty 2

## 2015-12-04 MED ORDER — LACTULOSE 10 GM/15ML PO SOLN
30.0000 g | Freq: Three times a day (TID) | ORAL | Status: DC
  Administered 2015-12-04 – 2015-12-06 (×7): 30 g via ORAL
  Filled 2015-12-04 (×7): qty 45

## 2015-12-04 NOTE — Progress Notes (Signed)
PROGRESS NOTE  Austin Padilla. [id number] DOB: 08/01/1960 DOA: 11/20/2015 PCP: Joycelyn Man, MD   LOS: 14 days   Brief Narrative: Austin Padilla. is a 55 y.o. 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 The patient was in his usual state of health until about one month ago when he started service decreased appetite, increased swelling in the belly, and weakness. The weakness has progressed to be severe, so that he needs a cane, cannot stand in the kitchen long enough to make himself dinner, and gets short of breath with exertion. Now over the last 2 weeks he has had dark urine, oliguria, and marked pedal edema. He went to his PCP, who recommended he be evaluated at the hospital.  Patient was admitted, and gastroenterology and nephrology were consulted following patient in the hospital. He underwent paracentesis with 5 L of fluid removal, and was placed on IV diuresis, his renal function initially got worse and hemodialysis was entertained, however he recovered and he is not improving. EGD on 8/2.  No active bleeding found.  Kidney functions slowly improving.  Assessment & Plan: Principal Problem:   Acute renal failure (HCC) Active Problems:   Essential hypertension   Alcoholic cirrhosis (HCC)   Alcohol use disorder, moderate, dependence (HCC)   Ascites   Anemia, unspecified   Hyponatremia   Thrombocytopenia (HCC)   Acute kidney injury ([place])   Hyperkalemia   Acute kidney failure, with hyperkalemia  - Hyperkalemia resolved now.  Following renal function panel.  - His creatinine increased up to 6.8, plateau for couple days, improving, repeat in AM. - no need for HD currently. Continue Lasix. Discussed with Dr. Moshe Cipro. - Elevated urine sodium, unlikely hepatorenal syndrome, etiology not clear - autoimmune workup non diagnostic, ANA negative,  MPO/Pr-3 negative, C3 mildly low, mornal C4 - Still with urine output.  Creatinine slightly improved.  He is to follow up with [facility] after discharge.    Anasarca - Improved.  Oral Lasix per nephrology. Replete potassium as needed.  Home on low dose lasix.   - daily weights being followed, has been diuresing.    Anemia of Chronic Disease:  - s/p 2 units PRBC, hemoglobin improved but trending down again on 7/27 status post 1 unit of packed red blood cell. Hemoglobin improved appropriately to 8.1 on 7/28 and has remained stable since.  No need for transfusion but closely monitor - No active GI bleeding found. Transfuse 2 more units PRBC 8/3 as Hg has trended down some.  Nephrology agrees.     - EGD 12/03/15: mild varices, portal gastropathy, no active bleeding.   Liver cirrhosis with ascites  - Liver disease due to alcohol abuse - Status post paracentesis 2, 7/21 for diagnostic purposes and 7/24 which was large volume, followed by albumin - He has ascites on exam, however is not tense, does not need a repeat paracentesis for now. No SOB or difficulty breathing.   - Gastroenterology was consulted and following patient,  Will need outpatient GI follow up.  - elevated ammonia, started on lactulose. Mental status remains clear.   Constipation - no BM in 20 days per patient - Received enema on 7/31 with excellent results  - Lactulose ordered TID, increased dose to 30 gm.   Alcohol use disorder - CIWA protocol, no s/s of withdrawal.   Hyponatremia - Hypervolemic, from liver disease.  - Now within  normal limits. Monitor while getting Lasix.  Has remained stable.   HTN - Hypertensive at admission. - Continue nadolol - controlled well.   Thrombocytopenia - From portal hypertension and cirrhosis, chronic alcoholism - Continues to improve, stable around 120s   Hypothyroidism - Continue levothyroxine - Most recent TSH 7/20, 4.78  GERD - Continue PPI  Chronic Alcoholism - Counseled  for cessation   UTI  - completed treatment with ceftriaxone IV.   DVT prophylaxis: SCD  Code Status: Full code  Family Communication: No family at bedside  Disposition Plan: Hopefully home in 1-2 days   Consultants:   Nephrology  Gastroenterology  Procedures:   None   Antimicrobials:  Ceftriaxone 7/21 >> 7/25  Subjective: - Pt has some low back pain but otherwise has been doing fairly well.  Ambulating with nurse.    Objective: Vitals:   12/03/15 0940 12/03/15 1424 12/03/15 2053 12/04/15 0633  BP: (!) 95/38 118/62 (!) 102/43 (!) 118/57  Pulse:  70 69 72  Resp:  18 18 18   Temp:  97.9 F (36.6 C) 98.1 F (36.7 C) 98.2 F (36.8 C)  TempSrc:  Oral Oral Oral  SpO2:  100% 100% 94%  Weight:    109.6 kg (241 lb 9.6 oz)  Height:        Intake/Output Summary (Last 24 hours) at 12/04/15 1215 Last data filed at 12/03/15 1751  Gross per 24 hour  Intake                0 ml  Output              500 ml  Net             -500 ml   Filed Weights   12/03/15 0520 12/03/15 0834 12/04/15 0633  Weight: 107.2 kg (236 lb 4.8 oz) 107 kg (236 lb) 109.6 kg (241 lb 9.6 oz)    Examination: Constitutional: NAD Vitals:   12/03/15 0940 12/03/15 1424 12/03/15 2053 12/04/15 0633  BP: (!) 95/38 118/62 (!) 102/43 (!) 118/57  Pulse:  70 69 72  Resp:  18 18 18   Temp:  97.9 F (36.6 C) 98.1 F (36.7 C) 98.2 F (36.8 C)  TempSrc:  Oral Oral Oral  SpO2:  100% 100% 94%  Weight:    109.6 kg (241 lb 9.6 oz)  Height:       ENMT: Mucous membranes are moist.  Respiratory: clear to auscultation bilaterally, no wheezing, no crackles. Normal respiratory effort. No accessory muscle use.  Cardiovascular: Regular rate and rhythm, no murmurs / rubs / gallops. 2+ pitting LE edema.  Abdomen: no tenderness. Bowel sounds positive. Ascites present, not tense Musculoskeletal: no clubbing / cyanosis.  Skin: no rashes, lesions, ulcers. No induration Neurologic: non focal    Data Reviewed: I have  personally reviewed following labs and imaging studies  CBC:  Recent Labs Lab 11/30/15 0523 12/01/15 0520 12/02/15 0526 12/03/15 0539 12/04/15 0600  WBC 4.0 4.5 4.3 5.2 5.1  HGB 7.7* 7.7* 8.0* 8.0* 7.5*  HCT 22.6* 23.1* 24.0* 24.0* 22.6*  MCV 95.4 95.5 94.9 94.9 95.0  PLT 82* 96* 111* 121* [id number]*   Basic Metabolic Panel:  Recent Labs Lab 11/30/15 0523 12/01/15 0520 12/02/15 0526 12/03/15 0539 12/04/15 0600  NA 136 135 134* 134* 134*  K 3.5 3.9 3.6 3.6 3.6  CL 101 100* 98* 98* 99*  CO2 24 26 25 26 25   GLUCOSE 91 83 79 83 101*  BUN 68* 76* 69*  65* 75*  CREATININE 5.20* 5.03* 4.60* 4.54* 4.23*  CALCIUM 8.1* 8.4* 8.4* 8.5* 8.4*  PHOS  --   --  3.6  --  3.8   GFR: Estimated Creatinine Clearance: 24.8 mL/min (by C-G formula based on SCr of 4.23 mg/dL). Liver Function Tests:  Recent Labs Lab 11/30/15 0523 12/02/15 0526 12/04/15 0600  AST 31  --   --   ALT 11*  --   --   ALKPHOS 103  --   --   BILITOT 1.2  --   --   PROT 6.5  --   --   ALBUMIN 2.8* 2.8* 2.9*   No results for input(s): LIPASE, AMYLASE in the last 168 hours.  Recent Labs Lab 12/03/15 1046 12/04/15 0600  AMMONIA 47* 95*   Coagulation Profile: No results for input(s): INR, PROTIME in the last 168 hours. Cardiac Enzymes: No results for input(s): CKTOTAL, CKMB, CKMBINDEX, TROPONINI in the last 168 hours. BNP (last 3 results) No results for input(s): PROBNP in the last 8760 hours. HbA1C: No results for input(s): HGBA1C in the last 72 hours. CBG: No results for input(s): GLUCAP in the last 168 hours. Lipid Profile: No results for input(s): CHOL, HDL, LDLCALC, TRIG, CHOLHDL, LDLDIRECT in the last 72 hours. Thyroid Function Tests: No results for input(s): TSH, T4TOTAL, FREET4, T3FREE, THYROIDAB in the last 72 hours. Anemia Panel:  Recent Labs  12/02/15 0526  FERRITIN 59  TIBC 258  IRON 57   Urine analysis:    Component Value Date/Time   COLORURINE RED (A) 11/21/2015 1512    APPEARANCEUR TURBID (A) 11/21/2015 1512   LABSPEC 1.013 11/21/2015 1512   PHURINE 6.0 11/21/2015 1512   GLUCOSEU NEGATIVE 11/21/2015 1512   HGBUR LARGE (A) 11/21/2015 1512   HGBUR negative 04/25/2008 0833   BILIRUBINUR MODERATE (A) 11/21/2015 1512   BILIRUBINUR 2+ 08/03/2011 1347   KETONESUR 15 (A) 11/21/2015 1512   PROTEINUR >300 (A) 11/21/2015 1512   UROBILINOGEN 1.0 11/03/2014 2035   NITRITE POSITIVE (A) 11/21/2015 1512   LEUKOCYTESUR LARGE (A) 11/21/2015 1512   Sepsis Labs: Invalid input(s): PROCALCITONIN, LACTICIDVEN  Recent Results (from the past 240 hour(s))  Culture, body fluid-bottle     Status: None   Collection Time: 11/24/15  3:37 PM  Result Value Ref Range Status   Specimen Description FLUID PERITONEAL  Final   Special Requests BOTTLES DRAWN AEROBIC AND ANAEROBIC 10CC  Final   Culture   Final    NO GROWTH 5 DAYS Performed at [facility]    Report Status 11/29/2015 FINAL  Final  Gram stain     Status: None   Collection Time: 11/24/15  3:37 PM  Result Value Ref Range Status   Specimen Description FLUID PERITONEAL  Final   Special Requests NONE  Final   Gram Stain   Final    RARE WBC PRESENT, PREDOMINANTLY MONONUCLEAR FEW RED BLOOD CELLS NO ORGANISMS SEEN Performed at [facility]    Report Status 11/24/2015 FINAL  Final      Radiology Studies: No results found.   Scheduled Meds: . sodium chloride   Intravenous Once  . sodium chloride   Intravenous Once  . darbepoetin (ARANESP) injection - NON-DIALYSIS  100 mcg Subcutaneous Q Tue-1800  . folic acid  1 mg Oral Daily  . furosemide  20 mg Intravenous Once  . furosemide  80 mg Oral BID  . lactulose  30 g Oral TID  . levothyroxine  50 mcg Oral QAC breakfast  .  lidocaine  1 patch Transdermal Q24H  . midodrine  10 mg Oral BID WC  . multivitamin with minerals  1 tablet Oral Daily  . nadolol  40 mg Oral Daily  . pantoprazole  40 mg Oral BID AC  . senna-docusate  2 tablet Oral QHS  .  thiamine  100 mg Oral Daily   Or  . thiamine  100 mg Intravenous Daily  . venlafaxine XR  150 mg Oral Q breakfast   Continuous Infusions:   Irwin Brakeman, MD [facility] Pager [phone]  If 7PM-7AM, please contact night-coverage [email or web address] Password [email or web address] 12/04/2015, 12:15 PM

## 2015-12-04 NOTE — Progress Notes (Signed)
Subjective:  less UOP- only 500 recorded but may have missed a shift- creatinine down  and BUN up- maybe due to GIB.  Had EGD yest- varices, gastric ulcer and gastritis Objective Vital signs in last 24 hours: Vitals:   12/03/15 0940 12/03/15 1424 12/03/15 2053 12/04/15 0633  BP: (!) 95/38 118/62 (!) 102/43 (!) 118/57  Pulse:  70 69 72  Resp:  18 18 18   Temp:  97.9 F (36.6 C) 98.1 F (36.7 C) 98.2 F (36.8 C)  TempSrc:  Oral Oral Oral  SpO2:  100% 100% 94%  Weight:    109.6 kg (241 lb 9.6 oz)  Height:       Weight change: -0.136 kg (-4.8 oz)  Intake/Output Summary (Last 24 hours) at 12/04/15 1048 Last data filed at 12/03/15 1751  Gross per 24 hour  Intake                0 ml  Output              500 ml  Net             -500 ml    Assessment/ Plan: Pt is a 55 y.o. yo male with history of AKI who was admitted on 11/20/2015 with recurrent AKI in the setting of cirrhosis/low BP   Assessment/Plan: 1. AKI- creatinine 0.8 in September 2016- had hematuria but all serologies negative- ultrasound shows some asymetry to kidneys- fortunately with supportive care renal function has started to improve so seemed to be c/w ATN- creatinine peaked in the high 6's - now down to 4.2- nonoliguric- no indications for HD.  Creatinine now back on trend of decreasing- BUN I think up due to blood in GI tract- is not uremic- cleaned tray 2. Volume- third spacing due to liver dz and hypoalbuminemia- making urine on moderated lasix- have changed to PO.  I also added midodrine to see if can get a little more kidney perfusion pressure happening  3. Anemia- significant issue- giving ESA  iron stores OK- consider transfusion-  EGD results as above- on PPI- hgb slightly lower  4. Hypokalemia- previously on repletion- I would continue low dose at discharge 5. Dispo- per primary- I would strongly consider a transfusion to top off prior to discharge.  If creatinine gets under 4 and still making good urine- I would be  comfortable with discharge tomorrow with close OP follow up - I can follow him at [place]: Basic Metabolic Panel:  Recent Labs Lab 12/02/15 0526 12/03/15 0539 12/04/15 0600  NA 134* 134* 134*  K 3.6 3.6 3.6  CL 98* 98* 99*  CO2 25 26 25   GLUCOSE 79 83 101*  BUN 69* 65* 75*  CREATININE 4.60* 4.54* 4.23*  CALCIUM 8.4* 8.5* 8.4*  PHOS 3.6  --  3.8   Liver Function Tests:  Recent Labs Lab 11/30/15 0523 12/02/15 0526 12/04/15 0600  AST 31  --   --   ALT 11*  --   --   ALKPHOS 103  --   --   BILITOT 1.2  --   --   PROT 6.5  --   --   ALBUMIN 2.8* 2.8* 2.9*   No results for input(s): LIPASE, AMYLASE in the last 168 hours.  Recent Labs Lab 12/03/15 1046 12/04/15 0600  AMMONIA 47* 95*   CBC:  Recent Labs Lab 11/30/15 0523 12/01/15 0520 12/02/15 0526 12/03/15 0539 12/04/15 0600  WBC 4.0  4.5 4.3 5.2 5.1  HGB 7.7* 7.7* 8.0* 8.0* 7.5*  HCT 22.6* 23.1* 24.0* 24.0* 22.6*  MCV 95.4 95.5 94.9 94.9 95.0  PLT 82* 96* 111* 121* 123*   Cardiac Enzymes: No results for input(s): CKTOTAL, CKMB, CKMBINDEX, TROPONINI in the last 168 hours. CBG: No results for input(s): GLUCAP in the last 168 hours.  Iron Studies:   Recent Labs  12/02/15 0526  IRON 57  TIBC 258  FERRITIN 59   Studies/Results: No results found. Medications: Infusions:    Scheduled Medications: . sodium chloride   Intravenous Once  . sodium chloride   Intravenous Once  . darbepoetin (ARANESP) injection - NON-DIALYSIS  100 mcg Subcutaneous Q Tue-1800  . folic acid  1 mg Oral Daily  . furosemide  20 mg Intravenous Once  . furosemide  80 mg Oral BID  . lactulose  30 g Oral TID  . levothyroxine  50 mcg Oral QAC breakfast  . lidocaine  1 patch Transdermal Q24H  . midodrine  10 mg Oral BID WC  . multivitamin with minerals  1 tablet Oral Daily  . nadolol  40 mg Oral Daily  . pantoprazole  40 mg Oral BID AC  . senna-docusate  2 tablet Oral QHS  . thiamine  100 mg  Oral Daily   Or  . thiamine  100 mg Intravenous Daily  . venlafaxine XR  150 mg Oral Q breakfast    have reviewed scheduled and prn medications.  Physical Exam: General: a little slow Heart: RRR Lungs: mostly clear Abdomen: distended Extremities: pitting edema    12/04/2015,10:48 AM  LOS: 14 days

## 2015-12-05 DIAGNOSIS — I8510 Secondary esophageal varices without bleeding: Secondary | ICD-10-CM

## 2015-12-05 DIAGNOSIS — E7220 Disorder of urea cycle metabolism, unspecified: Secondary | ICD-10-CM

## 2015-12-05 DIAGNOSIS — K7030 Alcoholic cirrhosis of liver without ascites: Secondary | ICD-10-CM

## 2015-12-05 LAB — TYPE AND SCREEN
ABO/RH(D): B POS
Antibody Screen: NEGATIVE
UNIT DIVISION: 0
Unit division: 0

## 2015-12-05 LAB — COMPREHENSIVE METABOLIC PANEL
ALK PHOS: 100 U/L (ref 38–126)
ALT: 11 U/L — AB (ref 17–63)
ANION GAP: 11 (ref 5–15)
AST: 34 U/L (ref 15–41)
Albumin: 2.9 g/dL — ABNORMAL LOW (ref 3.5–5.0)
BILIRUBIN TOTAL: 1.5 mg/dL — AB (ref 0.3–1.2)
BUN: 76 mg/dL — ABNORMAL HIGH (ref 6–20)
CALCIUM: 8.8 mg/dL — AB (ref 8.9–10.3)
CO2: 26 mmol/L (ref 22–32)
CREATININE: 4.1 mg/dL — AB (ref 0.61–1.24)
Chloride: 98 mmol/L — ABNORMAL LOW (ref 101–111)
GFR, EST AFRICAN AMERICAN: 17 mL/min — AB (ref >60)
GFR, EST NON AFRICAN AMERICAN: 15 mL/min — AB (ref >60)
Glucose, Bld: 80 mg/dL (ref 65–99)
Potassium: 3.4 mmol/L — ABNORMAL LOW (ref 3.5–5.1)
Sodium: 135 mmol/L (ref 135–145)
TOTAL PROTEIN: 6.9 g/dL (ref 6.5–8.1)

## 2015-12-05 LAB — CBC
HEMATOCRIT: 25.6 % — AB (ref 39.0–52.0)
HEMOGLOBIN: 8.5 g/dL — AB (ref 13.0–17.0)
MCH: 30.4 pg (ref 26.0–34.0)
MCHC: 33.2 g/dL (ref 30.0–36.0)
MCV: 91.4 fL (ref 78.0–100.0)
Platelets: 121 10*3/uL — ABNORMAL LOW (ref 150–400)
RBC: 2.8 MIL/uL — AB (ref 4.22–5.81)
RDW: 19.2 % — ABNORMAL HIGH (ref 11.5–15.5)
WBC: 4.6 10*3/uL (ref 4.0–10.5)

## 2015-12-05 MED ORDER — POTASSIUM CHLORIDE CRYS ER 20 MEQ PO TBCR
20.0000 meq | EXTENDED_RELEASE_TABLET | Freq: Every day | ORAL | Status: DC
  Administered 2015-12-05 – 2015-12-06 (×2): 20 meq via ORAL
  Filled 2015-12-05 (×2): qty 1

## 2015-12-05 NOTE — Progress Notes (Signed)
Subjective:  less UOP- only 75 recorded but may have missed a shift- creatinine down  and BUN up- maybe due to this slow GIB.  Not uremic.  Given one unit of blood   Objective Vital signs in last 24 hours: Vitals:   12/04/15 1546 12/04/15 1724 12/04/15 2031 12/05/15 0457  BP: 123/74 123/69 128/63 118/60  Pulse: 71 69 72 66  Resp: 18 18 18 18   Temp: 98.2 F (36.8 C) 98.1 F (36.7 C) 97.5 F (36.4 C) 98 F (36.7 C)  TempSrc: Oral Oral Oral Oral  SpO2: 100% 99% 100% 97%  Weight:    106.4 kg (234 lb 8 oz)  Height:       Weight change: -0.68 kg (-1 lb 8 oz)  Intake/Output Summary (Last 24 hours) at 12/05/15 1124 Last data filed at 12/05/15 0800  Gross per 24 hour  Intake             1275 ml  Output               75 ml  Net             1200 ml    Assessment/ Plan: Pt is a 55 y.o. yo male with history of AKI who was admitted on 11/20/2015 with recurrent AKI in the setting of cirrhosis/low BP   Assessment/Plan: 1. AKI- creatinine 0.8 in September 2016- had hematuria but all serologies negative- ultrasound shows some asymetry to kidneys- fortunately with supportive care renal function has started to improve so seemed to be c/w ATN- creatinine peaked in the high 6's - now down to 4.1- nonoliguric- no indications for HD.  Creatinine now back on trend of decreasing but slowly- BUN I think up due to blood in GI tract- is not uremic-  2. Volume- third spacing due to liver dz and hypoalbuminemia- making urine on moderated lasix- have changed to PO.  I also added midodrine to see if can get a little more kidney perfusion pressure happening.  UOP not being recorded - weights unreliable 3. Anemia- significant issue- giving ESA  iron stores OK- given transfusion yest-  EGD results as above- on PPI- hgb slightly lower  4. Hypokalemia- previously on repletion- I would continue low dose at discharge- added 20 daily  5. Dispo- per primary- numbers did not budge much- I had set a goal for creatinine less  than 4 for discharge- I would favor keep one more day with labs in AM- if hgb above 8 and creatinine at least stable but hopefully decreased I would be OK with discharge - I will arrange follow up with me at [place]: Basic Metabolic Panel:  Recent Labs Lab 12/02/15 0526 12/03/15 0539 12/04/15 0600 12/05/15 0550  NA 134* 134* 134* 135  K 3.6 3.6 3.6 3.4*  CL 98* 98* 99* 98*  CO2 25 26 25 26   GLUCOSE 79 83 101* 80  BUN 69* 65* 75* 76*  CREATININE 4.60* 4.54* 4.23* 4.10*  CALCIUM 8.4* 8.5* 8.4* 8.8*  PHOS 3.6  --  3.8  --    Liver Function Tests:  Recent Labs Lab 11/30/15 0523 12/02/15 0526 12/04/15 0600 12/05/15 0550  AST 31  --   --  34  ALT 11*  --   --  11*  ALKPHOS 103  --   --  100  BILITOT 1.2  --   --  1.5*  PROT 6.5  --   --  6.9  ALBUMIN 2.8* 2.8* 2.9* 2.9*   No results for input(s): LIPASE, AMYLASE in the last 168 hours.  Recent Labs Lab 12/03/15 1046 12/04/15 0600  AMMONIA 47* 95*   CBC:  Recent Labs Lab 12/01/15 0520 12/02/15 0526 12/03/15 0539 12/04/15 0600 12/04/15 2010 12/05/15 0550  WBC 4.5 4.3 5.2 5.1  --  4.6  HGB 7.7* 8.0* 8.0* 7.5* 8.7* 8.5*  HCT 23.1* 24.0* 24.0* 22.6* 26.1* 25.6*  MCV 95.5 94.9 94.9 95.0  --  91.4  PLT 96* 111* 121* 123*  --  121*   Cardiac Enzymes: No results for input(s): CKTOTAL, CKMB, CKMBINDEX, TROPONINI in the last 168 hours. CBG: No results for input(s): GLUCAP in the last 168 hours.  Iron Studies:  No results for input(s): IRON, TIBC, TRANSFERRIN, FERRITIN in the last 72 hours. Studies/Results: No results found. Medications: Infusions:    Scheduled Medications: . sodium chloride   Intravenous Once  . darbepoetin (ARANESP) injection - NON-DIALYSIS  100 mcg Subcutaneous Q Tue-1800  . folic acid  1 mg Oral Daily  . furosemide  80 mg Oral BID  . lactulose  30 g Oral TID  . levothyroxine  50 mcg Oral QAC breakfast  . lidocaine  1 patch Transdermal Q24H  . midodrine   10 mg Oral BID WC  . multivitamin with minerals  1 tablet Oral Daily  . nadolol  40 mg Oral Daily  . pantoprazole  40 mg Oral BID AC  . senna-docusate  2 tablet Oral QHS  . thiamine  100 mg Oral Daily   Or  . thiamine  100 mg Intravenous Daily  . venlafaxine XR  150 mg Oral Q breakfast    have reviewed scheduled and prn medications.  Physical Exam: General: a little slow Heart: RRR Lungs: mostly clear Abdomen: distended Extremities: pitting edema    12/05/2015,11:24 AM  LOS: 15 days

## 2015-12-05 NOTE — Progress Notes (Signed)
Nutrition Education Note  RD consulted for nutrition education regarding cirrhosis and low sodium diet.   RD provided "Low Sodium Nutrition Therapy" and "Cirrhosis Nutrition Therapy" handout from the [employer]. Reviewed patient's dietary recall. Provided examples on ways to decrease sodium intake in diet. Discouraged intake of processed foods and use of salt shaker. Encouraged fresh fruits and vegetables as well as whole grain sources of carbohydrates to maximize fiber intake.   RD discussed why it is important for patient to adhere to diet recommendations. Encouraged patient to take a daily multivitamin. Encouraged consistent and healthful PO intake. Teach back method used.  Expect good compliance. Pt plans to eat more fruits and vegetables and use less salt.   Body mass index is 32.71 kg/m. Pt meets criteria for Obesity based on current BMI.  Current diet order is Low Sodium, patient is consuming approximately 100% of meals at this time. Labs and medications reviewed. No further nutrition interventions warranted at this time. RD contact information provided. If additional nutrition issues arise, please re-consult RD.   Scarlette Ar RD, LDN Inpatient Clinical Dietitian Pager: [phone] After Hours Pager: [phone]

## 2015-12-05 NOTE — Progress Notes (Signed)
PROGRESS NOTE  Austin Padilla. AP:[id number] DOB: [birth year] DOA: 11/20/2015 PCP: Joycelyn Man, MD   LOS: 15 days   Brief Narrative: Austin Padilla. is a 55 y.o. 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 The patient was in his usual state of health until about one month ago when he started service decreased appetite, increased swelling in the belly, and weakness. The weakness has progressed to be severe, so that he needs a cane, cannot stand in the kitchen long enough to make himself dinner, and gets short of breath with exertion. Now over the last 2 weeks he has had dark urine, oliguria, and marked pedal edema. He went to his PCP, who recommended he be evaluated at the hospital.  Patient was admitted, and gastroenterology and nephrology were consulted following patient in the hospital. He underwent paracentesis with 5 L of fluid removal, and was placed on IV diuresis, his renal function initially got worse and hemodialysis was entertained, however he recovered and he is not improving. EGD on 8/2.  No active bleeding found.  Kidney functions slowly improving.  Assessment & Plan: Principal Problem:   Acute renal failure (HCC) Active Problems:   Essential hypertension   Alcoholic cirrhosis (HCC)   Alcohol use disorder, moderate, dependence (HCC)   Ascites   Anemia, unspecified   Hyponatremia   Thrombocytopenia (HCC)   Acute kidney injury ([place])   Hyperkalemia   Hyperammonemia (HCC)   Esophageal varices in alcoholic cirrhosis (HCC)   Low back pain   Acute kidney failure, with hyperkalemia  - Hyperkalemia resolved now.  Following renal function panel.  - His creatinine increased up to 6.8, plateau for couple days, improving.  . - no need for HD currently. Continue Lasix. Discussed with Dr. Moshe Cipro. - Elevated urine sodium, unlikely  hepatorenal syndrome, etiology not clear - autoimmune workup non diagnostic, ANA negative, MPO/Pr-3 negative, C3 mildly low, mornal C4 - Still with decent urine output.  Creatinine slightly improved.  He is to follow up with [facility] after discharge. Possible DC 8/5 if creatinine improves or stabilizes.    Anasarca - Improved.  Oral Lasix per nephrology. Replete potassium as needed.  Home on low dose lasix.   - daily weights being followed, has been diuresing.    Anemia of Chronic Disease:  - s/p 2 units PRBC, hemoglobin improved but trending down again on 7/27 status post 1 unit of packed red blood cell. Hemoglobin improved appropriately to 8.1 on 7/28 and has remained stable since.  No need for transfusion but closely monitor - No active GI bleeding found. Transfuse 2 more units PRBC 8/3 as Hg has trended down some.  Nephrology agrees.     - EGD 12/03/15: mild varices, portal gastropathy, no active bleeding.   Liver cirrhosis with ascites  - Liver disease due to alcohol abuse - Status post paracentesis 2, 7/21 for diagnostic purposes and 7/24 which was large volume, followed by albumin - He has ascites on exam, however is not tense, does not need a repeat paracentesis for now. No SOB or difficulty breathing.   - Gastroenterology was consulted and following patient,  Will need outpatient GI follow up.  - elevated ammonia, started on lactulose. Mental status remains clear.   Constipation - no BM in 20 days per patient - Received enema on 7/31 with excellent results  - Lactulose ordered TID, increased dose to 30  gm.   Alcohol use disorder - CIWA protocol, no s/s of withdrawal.   Hyponatremia - Hypervolemic, from liver disease.  - Now within normal limits. Monitor while getting Lasix.  Has remained stable.   HTN - Hypertensive at admission. - Continue nadolol - controlled well.   Thrombocytopenia - From portal hypertension and cirrhosis, chronic alcoholism - Continues to improve,  stable around 120s  Hypothyroidism - Continue levothyroxine - Most recent TSH 7/20, 4.78  GERD - Continue PPI  Chronic Alcoholism - Counseled for cessation   UTI  - completed treatment with ceftriaxone IV.   DVT prophylaxis: SCD  Code Status: Full code  Family Communication: No family at bedside  Disposition Plan: Hopefully home 8/5  Consultants:   Nephrology  Gastroenterology  Procedures:   None   Antimicrobials:  Ceftriaxone 7/21 >> 7/25  Subjective: - Pt denies complaints today.     Objective: Vitals:   12/04/15 1724 12/04/15 2031 12/05/15 0457 12/05/15 1336  BP: 123/69 128/63 118/60 133/78  Pulse: 69 72 66 68  Resp: 18 18 18 18   Temp: 98.1 F (36.7 C) 97.5 F (36.4 C) 98 F (36.7 C) 98.4 F (36.9 C)  TempSrc: Oral Oral Oral Oral  SpO2: 99% 100% 97% 98%  Weight:   106.4 kg (234 lb 8 oz)   Height:        Intake/Output Summary (Last 24 hours) at 12/05/15 1500 Last data filed at 12/05/15 0800  Gross per 24 hour  Intake             1275 ml  Output               75 ml  Net             1200 ml   Filed Weights   12/03/15 0834 12/04/15 0633 12/05/15 0457  Weight: 107 kg (236 lb) 109.6 kg (241 lb 9.6 oz) 106.4 kg (234 lb 8 oz)    Examination: Constitutional: NAD Vitals:   12/04/15 1724 12/04/15 2031 12/05/15 0457 12/05/15 1336  BP: 123/69 128/63 118/60 133/78  Pulse: 69 72 66 68  Resp: 18 18 18 18   Temp: 98.1 F (36.7 C) 97.5 F (36.4 C) 98 F (36.7 C) 98.4 F (36.9 C)  TempSrc: Oral Oral Oral Oral  SpO2: 99% 100% 97% 98%  Weight:   106.4 kg (234 lb 8 oz)   Height:       ENMT: Mucous membranes are moist.  Respiratory: clear to auscultation bilaterally, no wheezing, no crackles. Normal respiratory effort. No accessory muscle use.  Cardiovascular: Regular rate and rhythm, no murmurs / rubs / gallops. 1+ pitting LE edema.  Abdomen: no tenderness. Bowel sounds positive. Ascites present, not tense Musculoskeletal: no clubbing / cyanosis.    Skin: no rashes, lesions, ulcers. No induration Neurologic: non focal   Data Reviewed: I have personally reviewed following labs and imaging studies  CBC:  Recent Labs Lab 12/01/15 0520 12/02/15 0526 12/03/15 0539 12/04/15 0600 12/04/15 2010 12/05/15 0550  WBC 4.5 4.3 5.2 5.1  --  4.6  HGB 7.7* 8.0* 8.0* 7.5* 8.7* 8.5*  HCT 23.1* 24.0* 24.0* 22.6* 26.1* 25.6*  MCV 95.5 94.9 94.9 95.0  --  91.4  PLT 96* 111* 121* 123*  --  [id number]*   Basic Metabolic Panel:  Recent Labs Lab 12/01/15 0520 12/02/15 0526 12/03/15 0539 12/04/15 0600 12/05/15 0550  NA 135 134* 134* 134* 135  K 3.9 3.6 3.6 3.6 3.4*  CL 100* 98* 98* 99*  98*  CO2 26 25 26 25 26   GLUCOSE 83 79 83 101* 80  BUN 76* 69* 65* 75* 76*  CREATININE 5.03* 4.60* 4.54* 4.23* 4.10*  CALCIUM 8.4* 8.4* 8.5* 8.4* 8.8*  PHOS  --  3.6  --  3.8  --    GFR: Estimated Creatinine Clearance: 25.3 mL/min (by C-G formula based on SCr of 4.1 mg/dL). Liver Function Tests:  Recent Labs Lab 11/30/15 0523 12/02/15 0526 12/04/15 0600 12/05/15 0550  AST 31  --   --  34  ALT 11*  --   --  11*  ALKPHOS 103  --   --  100  BILITOT 1.2  --   --  1.5*  PROT 6.5  --   --  6.9  ALBUMIN 2.8* 2.8* 2.9* 2.9*   No results for input(s): LIPASE, AMYLASE in the last 168 hours.  Recent Labs Lab 12/03/15 1046 12/04/15 0600  AMMONIA 47* 95*   Coagulation Profile: No results for input(s): INR, PROTIME in the last 168 hours. Cardiac Enzymes: No results for input(s): CKTOTAL, CKMB, CKMBINDEX, TROPONINI in the last 168 hours. BNP (last 3 results) No results for input(s): PROBNP in the last 8760 hours. HbA1C: No results for input(s): HGBA1C in the last 72 hours. CBG: No results for input(s): GLUCAP in the last 168 hours. Lipid Profile: No results for input(s): CHOL, HDL, LDLCALC, TRIG, CHOLHDL, LDLDIRECT in the last 72 hours. Thyroid Function Tests: No results for input(s): TSH, T4TOTAL, FREET4, T3FREE, THYROIDAB in the last 72  hours. Anemia Panel: No results for input(s): VITAMINB12, FOLATE, FERRITIN, TIBC, IRON, RETICCTPCT in the last 72 hours. Urine analysis:    Component Value Date/Time   COLORURINE RED (A) 11/21/2015 1512   APPEARANCEUR TURBID (A) 11/21/2015 1512   LABSPEC 1.013 11/21/2015 1512   PHURINE 6.0 11/21/2015 1512   GLUCOSEU NEGATIVE 11/21/2015 1512   HGBUR LARGE (A) 11/21/2015 1512   HGBUR negative 04/25/2008 0833   BILIRUBINUR MODERATE (A) 11/21/2015 1512   BILIRUBINUR 2+ 08/03/2011 1347   KETONESUR 15 (A) 11/21/2015 1512   PROTEINUR >300 (A) 11/21/2015 1512   UROBILINOGEN 1.0 11/03/2014 2035   NITRITE POSITIVE (A) 11/21/2015 1512   LEUKOCYTESUR LARGE (A) 11/21/2015 1512   Sepsis Labs: Invalid input(s): PROCALCITONIN, LACTICIDVEN  No results found for this or any previous visit (from the past 240 hour(s)).   Radiology Studies: No results found.   Scheduled Meds: . sodium chloride   Intravenous Once  . darbepoetin (ARANESP) injection - NON-DIALYSIS  100 mcg Subcutaneous Q Tue-1800  . folic acid  1 mg Oral Daily  . furosemide  80 mg Oral BID  . lactulose  30 g Oral TID  . levothyroxine  50 mcg Oral QAC breakfast  . lidocaine  1 patch Transdermal Q24H  . midodrine  10 mg Oral BID WC  . multivitamin with minerals  1 tablet Oral Daily  . nadolol  40 mg Oral Daily  . pantoprazole  40 mg Oral BID AC  . potassium chloride  20 mEq Oral Daily  . senna-docusate  2 tablet Oral QHS  . thiamine  100 mg Oral Daily   Or  . thiamine  100 mg Intravenous Daily  . venlafaxine XR  150 mg Oral Q breakfast   Continuous Infusions:   Irwin Brakeman, MD [facility] Pager [phone]  If 7PM-7AM, please contact night-coverage [email or web address] Password [email or web address] 12/05/2015, 3:00 PM

## 2015-12-06 LAB — RENAL FUNCTION PANEL
ALBUMIN: 3.1 g/dL — AB (ref 3.5–5.0)
Anion gap: 11 (ref 5–15)
BUN: 68 mg/dL — AB (ref 6–20)
CHLORIDE: 99 mmol/L — AB (ref 101–111)
CO2: 25 mmol/L (ref 22–32)
CREATININE: 3.98 mg/dL — AB (ref 0.61–1.24)
Calcium: 8.7 mg/dL — ABNORMAL LOW (ref 8.9–10.3)
GFR, EST AFRICAN AMERICAN: 18 mL/min — AB (ref >60)
GFR, EST NON AFRICAN AMERICAN: 16 mL/min — AB (ref >60)
Glucose, Bld: 82 mg/dL (ref 65–99)
PHOSPHORUS: 3.6 mg/dL (ref 2.5–4.6)
Potassium: 3.6 mmol/L (ref 3.5–5.1)
Sodium: 135 mmol/L (ref 135–145)

## 2015-12-06 MED ORDER — POTASSIUM CHLORIDE CRYS ER 20 MEQ PO TBCR: 20.0000 meq | EXTENDED_RELEASE_TABLET | Freq: Every day | ORAL | 0 refills | Status: AC

## 2015-12-06 MED ORDER — LACTULOSE 10 GM/15ML PO SOLN: 20.0000 g | Freq: Three times a day (TID) | ORAL | 0 refills | Status: AC

## 2015-12-06 MED ORDER — PANTOPRAZOLE SODIUM 40 MG PO TBEC: DELAYED_RELEASE_TABLET | ORAL | 0 refills | Status: AC

## 2015-12-06 MED ORDER — FUROSEMIDE 80 MG PO TABS: 80.0000 mg | ORAL_TABLET | Freq: Two times a day (BID) | ORAL | 0 refills | Status: AC

## 2015-12-06 MED ORDER — OXYCODONE HCL 5 MG PO TABS: 5.0000 mg | ORAL_TABLET | Freq: Three times a day (TID) | ORAL | 0 refills | Status: AC | PRN

## 2015-12-06 MED ORDER — THIAMINE HCL 100 MG PO TABS: 100.0000 mg | ORAL_TABLET | Freq: Every day | ORAL | 0 refills | Status: AC

## 2015-12-06 MED ORDER — FOLIC ACID 1 MG PO TABS: 1.0000 mg | ORAL_TABLET | Freq: Every day | ORAL | 0 refills | Status: AC

## 2015-12-06 MED ORDER — VENLAFAXINE HCL ER 150 MG PO CP24: ORAL_CAPSULE | ORAL | 3 refills | Status: AC

## 2015-12-06 MED ORDER — MIDODRINE HCL 10 MG PO TABS: 10.0000 mg | ORAL_TABLET | Freq: Two times a day (BID) | ORAL | 0 refills | Status: AC

## 2015-12-06 NOTE — Discharge Summary (Signed)
Physician Discharge Summary  Austin Padilla. [id number] DOB: 1961/03/09 DOA: 11/20/2015  PCP: Joycelyn Man, MD  Admit date: 11/20/2015 Discharge date: 12/06/2015  Admitted From: Home Disposition:  Home  Recommendations for Outpatient Follow-up:  1. Follow up with nephrologist and gastroenterologist in 1 week 2. Follow up with PCP next week.  3. Please obtain BMP/CBC in one week.   Discharge Condition: STABLE BUT GUARDED This patient is at [place]: FULL Diet recommendation: renal and hepatic (Information provided to patient by dietitian)     Please see hospital notes, records, labs, images for full details of this long admission.   Brief/Interim Summary: Brief Narrative: Austin Corella. is a 55 y.o. 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 The patient was in his usual state of health until about one month ago when he started service decreased appetite, increased swelling in the belly, and weakness. The weakness has progressed to be severe, so that he needs a cane, cannot stand in the kitchen long enough to make himself dinner, and gets short of breath with exertion. Now over the last 2 weeks he has had dark urine, oliguria, and marked pedal edema. He went to his PCP, who recommended he be evaluated at the hospital.  Patient was admitted, and gastroenterology and nephrology were consulted following patient in the hospital. He underwent paracentesis with 5 L of fluid removal, and was placed on IV diuresis, his renal function initially got worse and hemodialysis was entertained, however he recovered and he is not improving. EGD on 8/2.  No active bleeding found.  Kidney functions slowly improving.   Assessment & Plan: Principal Problem:   Acute renal failure (HCC) Active Problems:   Essential hypertension    Alcoholic cirrhosis (HCC)   Alcohol use disorder, moderate, dependence (HCC)   Ascites   Anemia, unspecified   Hyponatremia   Thrombocytopenia (HCC)   Acute kidney injury ([place])   Hyperkalemia   Hyperammonemia (HCC)   Esophageal varices in alcoholic cirrhosis (HCC)   Low back pain  Acute kidney failure, with hyperkalemia - Hyperkalemia resolved now.  Followed renal function panel.  - His creatinine increased up to 6.8, plateau for couple days, improving and now below 4 prior to discharge. 3.68 at discharge - Did not require HD.  Continue oral Lasix. Discussed with and followed by Dr. Moshe Cipro who will follow him outpatient. - Elevated urine sodium, unlikely hepatorenal syndrome, etiology not clear.  - autoimmune workup non diagnostic, ANA negative, MPO/Pr-3 negative, C3 mildly low, mornal C4 - Decent urine output.  Creatinine slightly improved.  He is to follow up with [facility] after discharge. .    Anasarca - Improved with diuresis.  Oral Lasix per nephrology. Repleted potassium.    - daily weights followed, has been diuresing and weight is down.    Anemia of Chronic Disease: - s/p 2 units PRBC, hemoglobin improved but trending down again on 7/27 status post 1 unit of packed red blood cell. Hemoglobin improved appropriately to 8.1 on 7/28 and has remained stable since.  No need for transfusion but closely monitor - No active GI bleeding found. Transfused 2 more units PRBC 8/3 as Hg has trended down some.  Hg 8.5 prior to discharge.    - EGD 12/03/15: mild varices, portal gastropathy, no active bleeding.   Liver cirrhosis with ascites - Liver disease  due to alcohol abuse - Status post paracentesis 2, 7/21 for diagnostic purposes and 7/24 which was large volume, followed by albumin - He has ascites on exam, however is not tense, does not need a repeat paracentesis for now. No SOB or difficulty breathing.   - Gastroenterology was consulted and following  patient,  Will need outpatient GI follow up.  - elevated ammonia, started on lactulose with good results, 3-4 BM per day. Mental status remains clear.   Constipation - resolved now - Received enema on 7/31 with excellent results  - Lactulose ordered TID.   Alcohol use disorder - CIWA protocol, no s/s of withdrawal.  Counseled on cessation and vitamins given.   Hyponatremia - Hypervolemic, from liver disease.  - Now within normal limits.  Has remained stable.   HTN - Hypertensive at admission. - Continue nadolol - controlled well.   Thrombocytopenia - From portal hypertension and cirrhosis, chronic alcoholism - Continues to improve, stable around 120s  Hypothyroidism - Continue levothyroxine - Most recent TSH 7/20, 4.78  GERD - Continue PPI  Chronic Alcoholism - Counseled for cessation   UTI  - completed treatment withceftriaxone IV.   DVT prophylaxis: SCD  Code Status: Full code  Family Communication: No family at bedside  Disposition Plan: Discharge Home  Consultants:   Nephrology  Gastroenterology  Procedures:   None   Antimicrobials:  Ceftriaxone 7/21 >> 7/25  Subjective: - Pt denies complaints today.  He has been having BMs on lactulose.    Discharge Diagnoses:  Principal Problem:   Acute renal failure ([place]) Active Problems:   Essential hypertension   Alcoholic cirrhosis (HCC)   Alcohol use disorder, moderate, dependence (HCC)   Ascites   Anemia, unspecified   Hyponatremia   Thrombocytopenia (HCC)   Acute kidney injury ([place])   Hyperkalemia   Hyperammonemia (HCC)   Esophageal varices in alcoholic cirrhosis (HCC)   Low back pain  Discharge Instructions  Discharge Instructions    Increase activity slowly    Complete by:  As directed       Medication List    STOP taking these medications   spironolactone 100 MG tablet Commonly known as:  ALDACTONE     TAKE these medications   CENTRUM PO Take 1 tablet by mouth  daily.   folic acid 1 MG tablet Commonly known as:  FOLVITE Take 1 tablet (1 mg total) by mouth daily.   furosemide 80 MG tablet Commonly known as:  LASIX Take 1 tablet (80 mg total) by mouth 2 (two) times daily.   lactulose 10 GM/15ML solution Commonly known as:  CHRONULAC Take 30 mLs (20 g total) by mouth 3 (three) times daily.   levothyroxine 50 MCG tablet Commonly known as:  SYNTHROID, LEVOTHROID Take 1 tablet (50 mcg total) by mouth daily before breakfast.   midodrine 10 MG tablet Commonly known as:  PROAMATINE Take 1 tablet (10 mg total) by mouth 2 (two) times daily with a meal.   nadolol 40 MG tablet Commonly known as:  CORGARD Take 1 tablet (40 mg total) by mouth daily.   oxyCODONE 5 MG immediate release tablet Commonly known as:  Oxy IR/ROXICODONE Take 1 tablet (5 mg total) by mouth every 8 (eight) hours as needed for moderate pain or severe pain.   pantoprazole 40 MG tablet Commonly known as:  PROTONIX TAKE 1 TABLET TWICE DAILY. What changed:  additional instructions   potassium chloride SA 20 MEQ tablet Commonly known as:  K-DUR,KLOR-CON Take 1 tablet (  20 mEq total) by mouth daily.   thiamine 100 MG tablet Take 1 tablet (100 mg total) by mouth daily.   venlafaxine XR 150 MG 24 hr capsule Commonly known as:  EFFEXOR-XR TAKE (1) CAPSULE ONCE DAILY. What changed:  additional instructions      Follow-up Information    TODD,JEFFREY ALLEN, MD. Schedule an appointment as soon as possible for a visit in 1 week(s).   Specialty:  Family Medicine Why:  HOSPITAL FOLLOW UP  Contact information: [place] [address] [phone]        Louis Meckel, MD. Schedule an appointment as soon as possible for a visit in 1 week(s).   Specialty:  Nephrology Why:  [place] information: [place] [address] [phone]        Manus Gunning, MD. Schedule an appointment as soon as possible for  a visit in 2 week(s).   Specialty:  Gastroenterology Why:  HOSPITAL FOLLOW UP  Contact information: [address] [place] [address] [phone]          Allergies  Allergen Reactions  . Metoclopramide Hcl     REACTION: unspecified   Procedures/Studies: Dg Chest 2 View  Result Date: 11/26/2015 CLINICAL DATA:  Hx. Volume overload, no chest pain or sob, no congestion, slight cough EXAM: CHEST  2 VIEW COMPARISON:  11/06/2014 FINDINGS: Cardiac silhouette is top-normal in size. No mediastinal or hilar masses or evidence of adenopathy. Clear lungs.  No significant pleural effusion.  No pneumothorax. Bony thorax is demineralized but intact. IMPRESSION: No active cardiopulmonary disease. Electronically Signed   By: Lajean Manes M.D.   On: 11/26/2015 10:35  US Abdomen Complete  Result Date: 11/21/2015 CLINICAL DATA:  Renal failure. History of hypertension, GERD, alcoholic cirrhosis. EXAM: ABDOMEN ULTRASOUND COMPLETE COMPARISON:  None. FINDINGS: Gallbladder: Gallbladder walls are mildly thickened, measuring 4-5 mm thickness, likely reactive to the adjacent ascites and liver disease. Questionable small stones in the gallbladder neck region. Common bile duct: Diameter: Normal at 5 mm. Liver: Cirrhotic-appearing liver. No focal mass or lesion identified within the liver. Portal vein is shown to be patent with appropriate hepatopetal direction of blood flow. IVC: No abnormality visualized. Pancreas: Obscured by overlying bowel gas. Spleen: Enlarged, measuring 15 x 6 x 14 cm. No focal mass or lesion identified within the spleen. Right Kidney: Length: 11.7 cm. Echogenicity within normal limits. No mass or hydronephrosis visualized. Left Kidney: Length: 9.8 cm. Left renal cyst measuring 4 cm. Left kidney otherwise unremarkable without hydronephrosis. Abdominal aorta: No aneurysm visualized. Portions obscured by overlying bowel gas. Other findings: Ascites throughout the abdomen, most prominent  within the right upper quadrant. IMPRESSION: 1. Ascites. 2. Cirrhotic-appearing liver. 3. Gallbladder walls mildly thickened, likely reactive to adjacent ascites and liver disease. Questionable small gallstones. No bile duct dilatation. 4. Splenomegaly. Electronically Signed   By: Franki Cabot M.D.   On: 11/21/2015 17:30   US Paracentesis  Result Date: 11/24/2015 INDICATION: Cirrhosis, ascites. Request made for diagnostic and therapeutic paracentesis up to 5 liters. EXAM: ULTRASOUND GUIDED DIAGNOSTIC AND THERAPEUTIC PARACENTESIS MEDICATIONS: None. COMPLICATIONS: None immediate. PROCEDURE: Informed written consent was obtained from the patient after a discussion of the risks, benefits and alternatives to treatment. A timeout was performed prior to the initiation of the procedure. Initial ultrasound scanning demonstrates a large amount of ascites within the right lower abdominal quadrant. The right lower abdomen was prepped and draped in the usual sterile fashion. 1%  lidocaine was used for local anesthesia. Following this, a Yueh catheter was introduced. An ultrasound image was saved for documentation purposes. The paracentesis was performed. The catheter was removed and a dressing was applied. The patient tolerated the procedure well without immediate post procedural complication. FINDINGS: A total of approximately 5 liters of yellow fluid was removed. Samples were sent to the laboratory as requested by the clinical team. IMPRESSION: Successful ultrasound-guided diagnostic and therapeutic paracentesis yielding 5 liters of peritoneal fluid. Read by: Rowe Robert, PA-C Electronically Signed   By: Jerilynn Mages.  Shick M.D.   On: 11/24/2015 15:57  US Paracentesis  Result Date: 11/21/2015 INDICATION: Patient with a history of cirrhosis. Worsening abdominal distention. Request made for paracentesis to rule out spontaneous bacterial peritonitis. Request for only enough fluid for diagnostic purposes. His kidney function is too  bad for a therapeutic paracentesis at this time. EXAM: ULTRASOUND GUIDED DIAGNOSTIC PARACENTESIS MEDICATIONS: 1% LIDOCAINE. COMPLICATIONS: None immediate. PROCEDURE: Informed written consent was obtained from the patient after a discussion of the risks, benefits and alternatives to treatment. A timeout was performed prior to the initiation of the procedure. Initial ultrasound scanning demonstrates a large amount of ascites within the right lower abdominal quadrant. The right lower abdomen was prepped and draped in the usual sterile fashion. 1% lidocaine was used for local anesthesia. Following this, a 19 gauge, 7-cm, Yueh catheter was introduced. An ultrasound image was saved for documentation purposes. The limited paracentesis was performed. The catheter was removed and a dressing was applied. The patient tolerated the procedure well without immediate post procedural complication. FINDINGS: A total of approximately 0.1 L of clear yellow fluid was removed. Samples were sent to the laboratory as requested by the clinical team. IMPRESSION: Successful ultrasound-guided paracentesis yielding 0.1 liters of peritoneal fluid. Read by: Saverio Danker, PA-C Electronically Signed   By: Jacqulynn Cadet M.D.   On: 11/21/2015 16:44   Discharge Exam: Vitals:   12/05/15 2144 12/06/15 0421  BP: 139/73 126/65  Pulse: 65 73  Resp: 19 16  Temp: 98.4 F (36.9 C) 97.9 F (36.6 C)   Vitals:   12/05/15 0457 12/05/15 1336 12/05/15 2144 12/06/15 0421  BP: 118/60 133/78 139/73 126/65  Pulse: 66 68 65 73  Resp: 18 18 19 16   Temp: 98 F (36.7 C) 98.4 F (36.9 C) 98.4 F (36.9 C) 97.9 F (36.6 C)  TempSrc: Oral Oral Oral Oral  SpO2: 97% 98% 95% 100%  Weight: 106.4 kg (234 lb 8 oz)   105.6 kg (232 lb 12.9 oz)  Height:       ENMT: Mucous membranes are moist.  Respiratory: clear to auscultation bilaterally, no wheezing, no crackles. Normal respiratory effort. No accessory muscle use.  Cardiovascular: Regular rate and  rhythm, no murmurs / rubs / gallops. 1+ pitting LE edema.  Abdomen: no tenderness. Bowel sounds positive. Ascites present, not tense Musculoskeletal: no clubbing / cyanosis.  Skin: no rashes, lesions, ulcers. No induration Neurologic: non focal , MENTAL STATUS CLEAR, oriented x 3  The results of significant diagnostics from this hospitalization (including imaging, microbiology, ancillary and laboratory) are listed below for reference.    Microbiology: No results found for this or any previous visit (from the past 240 hour(s)).   Labs: BNP (last 3 results) No results for input(s): BNP in the last 8760 hours. Basic Metabolic Panel:  Recent Labs Lab 12/02/15 0526 12/03/15 0539 12/04/15 0600 12/05/15 0550 12/06/15 0516  NA 134* 134* 134* 135 135  K 3.6 3.6 3.6 3.4*  3.6  CL 98* 98* 99* 98* 99*  CO2 25 26 25 26 25   GLUCOSE 79 83 101* 80 82  BUN 69* 65* 75* 76* 68*  CREATININE 4.60* 4.54* 4.23* 4.10* 3.98*  CALCIUM 8.4* 8.5* 8.4* 8.8* 8.7*  PHOS 3.6  --  3.8  --  3.6   Liver Function Tests:  Recent Labs Lab 11/30/15 0523 12/02/15 0526 12/04/15 0600 12/05/15 0550 12/06/15 0516  AST 31  --   --  34  --   ALT 11*  --   --  11*  --   ALKPHOS 103  --   --  100  --   BILITOT 1.2  --   --  1.5*  --   PROT 6.5  --   --  6.9  --   ALBUMIN 2.8* 2.8* 2.9* 2.9* 3.1*   No results for input(s): LIPASE, AMYLASE in the last 168 hours.  Recent Labs Lab 12/03/15 1046 12/04/15 0600  AMMONIA 47* 95*   CBC:  Recent Labs Lab 12/01/15 0520 12/02/15 0526 12/03/15 0539 12/04/15 0600 12/04/15 2010 12/05/15 0550  WBC 4.5 4.3 5.2 5.1  --  4.6  HGB 7.7* 8.0* 8.0* 7.5* 8.7* 8.5*  HCT 23.1* 24.0* 24.0* 22.6* 26.1* 25.6*  MCV 95.5 94.9 94.9 95.0  --  91.4  PLT 96* 111* 121* 123*  --  121*   Cardiac Enzymes: No results for input(s): CKTOTAL, CKMB, CKMBINDEX, TROPONINI in the last 168 hours. BNP: Invalid input(s): POCBNP CBG: No results for input(s): GLUCAP in the last 168  hours. D-Dimer No results for input(s): DDIMER in the last 72 hours. Hgb A1c No results for input(s): HGBA1C in the last 72 hours. Lipid Profile No results for input(s): CHOL, HDL, LDLCALC, TRIG, CHOLHDL, LDLDIRECT in the last 72 hours. Thyroid function studies No results for input(s): TSH, T4TOTAL, T3FREE, THYROIDAB in the last 72 hours.  Invalid input(s): FREET3 Anemia work up No results for input(s): VITAMINB12, FOLATE, FERRITIN, TIBC, IRON, RETICCTPCT in the last 72 hours. Urinalysis    Component Value Date/Time   COLORURINE RED (A) 11/21/2015 1512   APPEARANCEUR TURBID (A) 11/21/2015 1512   LABSPEC 1.013 11/21/2015 1512   PHURINE 6.0 11/21/2015 1512   GLUCOSEU NEGATIVE 11/21/2015 1512   HGBUR LARGE (A) 11/21/2015 1512   HGBUR negative 04/25/2008 0833   BILIRUBINUR MODERATE (A) 11/21/2015 1512   BILIRUBINUR 2+ 08/03/2011 1347   KETONESUR 15 (A) 11/21/2015 1512   PROTEINUR >300 (A) 11/21/2015 1512   UROBILINOGEN 1.0 11/03/2014 2035   NITRITE POSITIVE (A) 11/21/2015 1512   LEUKOCYTESUR LARGE (A) 11/21/2015 1512   Sepsis Labs Invalid input(s): PROCALCITONIN,  WBC,  LACTICIDVEN Microbiology No results found for this or any previous visit (from the past 240 hour(s)).  Time coordinating discharge: 45 minutes  SIGNED:  Irwin Brakeman, MD  [facility] 12/06/2015, 11:20 AM Pager   If 7PM-7AM, please contact night-coverage [email or web address] Password [email or web address]

## 2015-12-06 NOTE — Progress Notes (Signed)
Assumed care of patient.   Assisted patient in getting dressed. Patient states he parked car with valet. Patient then stated he found his keys. Patient then stated he did not have his keys and they were in [place]. patient does not have valet stub. Security called, states they will assist at front of hospital. Patient to be discharged.  Austin Padilla. Brigitte Pulse, RN

## 2015-12-06 NOTE — Discharge Instructions (Signed)
DO NOT DRIVE OR OPERATE MACHINERY IF YOU HAVE TAKEN OXYCODONE.      Alcoholic Liver Disease Alcoholic liver disease happens when the liver does not work the way it should. The condition is caused by drinking too much alcohol for many years.  HOME CARE  Do not drink alcohol.  Take medicines only as told by your doctor.  Take vitamins only as told by your doctor.  Follow any diet instructions that your doctor gave you. You may need to:  Eat foods that have thiamine. These include whole-wheat cereals, pork, and raw vegetables.  Eat foods that have folic acid. These include vegetables, fruits, meats, beans, nuts, and dairy foods.  Eat foods that are high in carbohydrates. These include yogurt, beans, potatoes, and rice. GET HELP IF:  You have a fever.  You are short of breath.  You have trouble breathing.  You have bright red blood in your poop (stool).  Your poop looks like tar.  You throw up (vomit) blood.  Your skin looks more yellow, pale, or dark.  You get headaches.  You have trouble thinking.  You have trouble balancing or walking.   This information is not intended to replace advice given to you by your health care provider. Make sure you discuss any questions you have with your health care provider.   Document Released: 02/14/2009 Document Revised: 09/03/2014 Document Reviewed: 03/21/2014 Elsevier Interactive Patient Education 2016 [employer]  Acute Kidney Injury Acute kidney injury is any condition in which there is sudden (acute) damage to the kidneys. Acute kidney injury was previously known as acute kidney failure or acute renal failure. The kidneys are two organs that lie on either side of the spine between the middle of the back and the front of the abdomen. The kidneys:  Remove wastes and extra water from the blood.   Produce important hormones. These help keep bones strong, regulate blood pressure, and help create red blood cells.   Balance  the fluids and chemicals in the blood and tissues. A small amount of kidney damage may not cause problems, but a large amount of damage may make it difficult or impossible for the kidneys to work the way they should. Acute kidney injury may develop into long-lasting (chronic) kidney disease. It may also develop into a life-threatening disease called end-stage kidney disease. Acute kidney injury can get worse very quickly, so it should be treated right away. Early treatment may prevent other kidney diseases from developing. CAUSES   A problem with blood flow to the kidneys. This may be caused by:   Blood loss.   Heart disease.   Severe burns.   Liver disease.  Direct damage to the kidneys. This may be caused by:  Some medicines.   A kidney infection.   Poisoning or consuming toxic substances.   A surgical wound.   A blow to the kidney area.   A problem with urine flow. This may be caused by:   Cancer.   Kidney stones.   An enlarged prostate. SIGNS AND SYMPTOMS   Swelling (edema) of the legs, ankles, or feet.   Tiredness (lethargy).   Nausea or vomiting.   Confusion.   Problems with urination, such as:   Painful or burning feeling during urination.   Decreased urine production.   Frequent accidents in children who are potty trained.   Bloody urine.   Muscle twitches and cramps.   Shortness of breath.   Seizures.   Chest pain or pressure. Sometimes,  no symptoms are present. DIAGNOSIS Acute kidney injury may be detected and diagnosed by tests, including blood, urine, imaging, or kidney biopsy tests.  TREATMENT Treatment of acute kidney injury varies depending on the cause and severity of the kidney damage. In mild cases, no treatment may be needed. The kidneys may heal on their own. If acute kidney injury is more severe, your health care provider will treat the cause of the kidney damage, help the kidneys heal, and prevent  complications from occurring. Severe cases may require a procedure to remove toxic wastes from the body (dialysis) or surgery to repair kidney damage. Surgery may involve:   Repair of a torn kidney.   Removal of an obstruction. HOME CARE INSTRUCTIONS  Follow your prescribed diet.  Take medicines only as directed by your health care provider.  Do not take any new medicines (prescription, over-the-counter, or nutritional supplements) unless approved by your health care provider. Many medicines can worsen your kidney damage or may need to have the dose adjusted.   Keep all follow-up visits as directed by your health care provider. This is important.  Observe your condition to make sure you are healing as expected. SEEK IMMEDIATE MEDICAL CARE IF:  You are feeling ill or have severe pain in the back or side.   Your symptoms return or you have new symptoms.  You have any symptoms of end-stage kidney disease. These include:   Persistent itchiness.   Loss of appetite.   Headaches.   Abnormally dark or light skin.  Numbness in the hands or feet.   Easy bruising.   Frequent hiccups.   Menstruation stops.   You have a fever.  You have increased urine production.  You have pain or bleeding when urinating. MAKE SURE YOU:   Understand these instructions.  Will watch your condition.  Will get help right away if you are not doing well or get worse.   This information is not intended to replace advice given to you by your health care provider. Make sure you discuss any questions you have with your health care provider.   Document Released: 11/02/2010 Document Revised: 05/10/2014 Document Reviewed: 12/17/2011 Elsevier Interactive Patient Education 2016 [employer]  Ascites Ascites is a collection of excess fluid in the abdomen. Ascites can range from mild to severe. It can get worse without treatment. CAUSES Possible causes include:  Cirrhosis. This is the  most common cause of ascites.  Infection or inflammation in the abdomen.  Cancer in the abdomen.  Heart failure.  Kidney disease.  Inflammation of the pancreas.  Clots in the veins of the liver. SIGNS AND SYMPTOMS Signs and symptoms may include:  A feeling of fullness in your abdomen. This is common.  An increase in the size of your abdomen or your waist.  Swelling in your legs.  Swelling of the scrotum in men.  Difficulty breathing.  Abdominal pain.  Sudden weight gain. If the condition is mild, you may not have symptoms. DIAGNOSIS To make a diagnosis, your health care provider will:  Ask about your medical history.  Perform a physical exam.  Order imaging tests, such as an ultrasound or CT scan of your abdomen. TREATMENT Treatment depends on the cause of the ascites. It may include:  Taking a pill to make you urinate. This is called a water pill (diuretic pill).  Strictly reducing your salt (sodium) intake. Salt can cause extra fluid to be kept in the body, and this makes ascites worse.  Having a  procedure to remove fluid from your abdomen (paracentesis).  Having a procedure to transfer fluid from your abdomen into a vein.  Having a procedure that connects two of the major veins within your liver and relieves pressure on your liver (TIPS procedure). Ascites may go away or improve with treatment of the condition that caused it.  HOME CARE INSTRUCTIONS  Keep track of your weight. To do this, weigh yourself at the same time every day and record your weight.  Keep track of how much you drink and any changes in the amount you urinate.  Follow any instructions that your health care provider gives you about how much to drink.  Try not to eat salty (high-sodium) foods.  Take medicines only as directed by your health care provider.  Keep all follow-up visits as directed by your health care provider. This is important.  Report any changes in your health to  your health care provider, especially if you develop new symptoms or your symptoms get worse. SEEK MEDICAL CARE IF:  Your gain more than 3 pounds in 3 days.  Your abdominal size or your waist size increases.  You have new swelling in your legs.  The swelling in your legs gets worse. SEEK IMMEDIATE MEDICAL CARE IF:  You develop a fever.  You develop confusion.  You develop new or worsening difficulty breathing.  You develop new or worsening abdominal pain.  You develop new or worsening swelling in the scrotum (in men).   This information is not intended to replace advice given to you by your health care provider. Make sure you discuss any questions you have with your health care provider.   Document Released: 04/19/2005 Document Revised: 05/10/2014 Document Reviewed: 11/16/2013 Elsevier Interactive Patient Education 2016 [employer]  Alcoholic Liver Disease The liver is an organ that converts food into energy, absorbs vitamins from food, removes toxins from the blood, and makes proteins. Alcoholic liver disease happens when the liver becomes damaged due to alcohol consumption and it stops working properly. CAUSES This condition is caused by drinking too much alcohol over a number of years. RISK FACTORS This condition is more likely to develop in:  Women.  People who have a family history of the disease.  People who have poor nutrition.  People who are obese. SYMPTOMS Early symptoms of this condition include:  Fatigue.  Weakness.  Abdominal discomfort on the upper right side. Symptoms of moderate disease include:  Fever.  Yellow, pale, or darkening skin.  Abdominal pain.  Nausea and vomiting.  Weight loss.  Loss of appetite. Symptoms of advanced disease include:  Abdominal swelling.  Nosebleeds or bleeding gums.  Itchy skin.  Enlarged fingertips (clubbing).  Light-colored stools that smell very bad (steatorrhea).  Mood changes.  Feeling  agitated.  Confusion.  Trouble concentrating. Some people do not have symptoms until the condition becomes severe. Symptoms are often worse after heavy drinking. DIAGNOSIS This condition is diagnosed with:  A physical exam.  Blood tests.  Taking a sample of liver tissue to examine under a microscope (liver biopsy). You may also have other tests, including:   X-rays.  Ultrasound. TREATMENT Treatment may include:  Stopping alcohol use to allow the liver to heal.  Joining a support group or meeting with a counselor.  Medicines to reduce inflammation. These may be recommended if the disease is severe.  A liver transplant. This is the only treatment if the disease is very severe.  Nutritional therapy. This may involve:  Taking vitamins.  Eating foods that are high in thiamine, such as whole-wheat cereals, pork, and raw vegetables.  Eating foods that have a lot of folic acid, such as vegetables, fruits, meats, beans, nuts, and dairy foods.  Eating a diet that includes carbohydrate-rich foods, such as yogurt, beans, potatoes, and rice. HOME CARE INSTRUCTIONS  Do not drink alcohol.  Take medicines only as directed by your health care provider.  Take vitamins only as directed by your health care provider.  Follow any diet instructions that are given to you by your health care provider. SEEK MEDICAL CARE IF:  You have a fever.  You have shortness of breath or have difficulty breathing.  You have bright red blood in your stool, or you have black, tarry stools.  You are vomiting blood.  Your skin color becomes more yellow, pale, or dark.  You develop headaches.  You have trouble thinking.  You have problems balancing or walking.   This information is not intended to replace advice given to you by your health care provider. Make sure you discuss any questions you have with your health care provider.   Document Released: 05/10/2014 Document Reviewed:  05/10/2014 Elsevier Interactive Patient Education 2016 [employer]   Chronic Kidney Disease Chronic kidney disease happens when the kidneys are damaged over a long period. The kidneys are two organs that do many important jobs in the body. These jobs include:  Removing wastes and extra fluids from the blood.  Making hormones that help to keep the body healthy.  Making sure that the body has the right amount of fluids and chemicals. Chronic kidney disease may be caused by many things. The kidney damage occurs slowly. If too much damage occurs, the kidneys may stop working the way that they should. This is dangerous. Treatment can help to slow down the damage and keep it from getting worse. HOME CARE  Follow your diet as told by your doctor. You may need to limit the amount of salt (sodium) and protein that you eat each day.  Take medicines only as told by your doctor. Do not take any new medicines unless your doctor approves it.  Quit smoking if you smoke. Talk to your doctor about programs that may help you quit smoking.  Have your blood pressure checked regularly and keep track of the results.  Start or keep doing an exercise plan.  Get shots (immunizations) as told by your doctor.  Take vitamins and minerals as told by your doctor.  Keep all follow-up visits as told by your doctor. This is important. GET HELP RIGHT AWAY IF:   Your symptoms get worse.  You have new symptoms.  You have symptoms of end-stage kidney disease. These include:  Headaches.  Skin that is darker or lighter than normal.  Numbness in the hands or feet.  Easy bruising.  Frequent hiccups.  Stopping of menstrual periods in women.  You have a fever.  You are making very little pee (urine).  You have pain or bleeding when you pee.   This information is not intended to replace advice given to you by your health care provider. Make sure you discuss any questions you have with your health care  provider.   Document Released: 07/14/2009 Document Revised: 01/08/2015 Document Reviewed: 12/17/2011 Elsevier Interactive Patient Education 2016 [employer]   Esophageal Varices The esophagus is the passage that connects the throat to the stomach. Esophageal varices are blood vessels in the esophagus that have become enlarged. They develop when extra  blood is forced to flow through them because the blood's normal pathway is blocked. Without treatment these blood vessels eventually break and bleed (hemorrhage). A hemorrhage is life-threatening. CAUSES This condition may be caused by:  Scarring of the liver (cirrhosis) due to alcoholism. This is the most common cause.  Liver disease.  Severe heart failure.  A blood clot in the portal vein.  Sarcoidosis. This is an inflammatory disease that can affect the liver.  Schistosomiasis. This is a parasitic infection that can cause liver damage. SYMPTOMS Usually there are no symptoms unless the esophageal varices bleed. Symptoms of bleeding esophageal varices include:  Vomiting material that is bright red or that is black and looks like coffee grounds.  Coughing up blood.  Black, tarry stools.  Dizziness or lightheadedness.  Low blood pressure.  Loss of consciousness. DIAGNOSIS This condition is diagnosed with tests, such as:  Endoscopy. During this test a thin, lighted tube is inserted through the mouth and into the esophagus.  Blood tests. These may be done to check liver function, blood counts, and the body's ability to form blood clots. TREATMENT This condition may be treated with:  Medicines that reduce pressure in the esophageal varices and reduce the risk of bleeding.  Procedures to reduce pressure in the esophageal varices and reduce the risk of bleeding or stop bleeding. These include:  Variceal ligation. In this procedure, a rubber band is placed around the esophageal varices to keep them from bleeding.  Injection  therapy. This treatment involves an injection that causes the esophageal varices to shrink and close (sclerotherapy). Medicines that tighten blood vessels or alter blood flow may also be used.  Balloon tamponade. In this procedure, a tube is put into the esophagus and a balloon is passed through it and inflated.  Transjugular intrahepatic portosystemic shunt (TIPS) placement. In this procedure, a small tube is placed within the liver veins. This decreases blood flow and pressure to the esophageal varices.  A liver transplant. This may be done if other treatments do not work. HOME CARE INSTRUCTIONS  Take medicines only as directed by your health care provider.  Follow your health care provider's instructions about rest and physical activity. SEEK IMMEDIATE MEDICAL CARE IF:  You have any symptoms of this condition after treatment.  You are unable to eat or drink.  You have chest pain.   This information is not intended to replace advice given to you by your health care provider. Make sure you discuss any questions you have with your health care provider.   Document Released: 07/10/2003 Document Revised: 09/03/2014 Document Reviewed: 04/15/2014 Elsevier Interactive Patient Education 2016 [place] for Chronic Kidney Disease When your kidneys are not working well, they cannot remove waste and excess substances from your blood as effectively as they did before. This can lead to a buildup and imbalance of these substances, which can affect how your body functions. This buildup can also make your kidneys work harder, causing even more damage. You may need to eat less of certain foods that can lead to the buildup of these substances in your body. By making the changes to your diet that are recommended by your dietitian or health care provider, you could possibly help prevent further kidney damage and delay or prevent the need for dialysis. The following information can help give  you a basic understanding of these substances and how they affect your bodily functions. The information also gives examples of foods that contain the highest amounts  of these substances. WHAT DO I NEED TO KNOW ABOUT SUBSTANCES IN MY FOOD THAT I MAY NEED TO ADJUST? Food adjustments will be different for each person with chronic kidney disease. It is important that you see a dietitian who can help you determine the specific adjustments that you will need to make for each of the following substances: Potassium Potassium affects how steadily your heart beats. If too much potassium builds up in your blood, it can cause an irregular heartbeat or even a heart attack. Examples of foods rich in potassium include:  Milk.  Fruits.  Vegetables. Phosphorus Phosphorus is a mineral found in your bones. A balance between calcium and phosphorous is needed to build and maintain healthy bones. Too much phosphorus pulls calcium from your bones. This can make your bones weak and more likely to break. Too much phosphorus can also make your skin itch. Examples of foods rich in phosphorus include:  Milk and cheese.  Dried beans.  Peas.  Colas.  Nuts and peanut butter. Animal Protein Animal protein helps you make and keep muscle. It also helps in the repair of your body's cells and tissues. One of the natural breakdown products of protein is a waste product called urea. When your kidneys are not working properly, they cannot remove wastes such as urea like they did before you developed chronic kidney disease. You will likely need to limit the amount of protein you eat to help prevent a buildup of urea in your blood. Examples of animal protein include:  Meat (all types).  Fish and seafood.  Poultry.  Eggs. Sodium Sodium, which is found in salt, helps maintain a healthy balance of fluids in your body. Too much sodium can increase your blood pressure level and have a negative affect on the function of your  heart and lungs. Too much sodium also can cause your body to retain too much fluid, making your kidneys work harder. Examples of foods with high levels of sodium include:  Salt seasonings.  Soy sauce.  Cured and processed meats.  Salted crackers and snack foods.  Fast food.  Canned soups and most canned foods. Glucose Glucose provides energy for your body. If you have diabetes mellitus that is not properly controlled, you have too much glucose in your blood. Too much glucose in your blood can worsen the function of your kidneys by damaging small blood vessels. This prevents enough blood flow to your kidneys to give them what they need to work. If you have diabetes mellitus and chronic kidney disease, it is important to maintain your blood glucose at a level recommended by your health care provider. SHOULD I TAKE A VITAMIN AND MINERAL SUPPLEMENT? Because you may need to avoid eating certain foods, you may not get all of the vitamins and minerals that would normally come from those foods. Your health care provider or dietitian may recommend that you take a supplement to ensure that you get all of the vitamins and minerals that your body needs.    This information is not intended to replace advice given to you by your health care provider. Make sure you discuss any questions you have with your health care provider.   Document Released: 07/10/2002 Document Revised: 05/10/2014 Document Reviewed: 03/16/2013 Elsevier Interactive Patient Education 2016 [employer]  Paracentesis Paracentesis is a procedure to remove excess fluid (ascites) from the belly (abdomen). Ascites can result from certain conditions, such as infection, inflammation, abdominal injury, heart failure, chronic scarring of the liver (cirrhosis), or  cancer. Ascites is removed using a needle that is inserted through the skin and tissue into the abdomen. This procedure may be done:  To determine the cause of the ascites.  To  relieve symptoms that are caused by the ascites, such as pain or shortness of breath.  To see if there is bleeding after an abdominal injury. LET [facility] CARE PROVIDER KNOW ABOUT:  Any allergies you have.  All medicines you are taking, including vitamins, herbs, eye drops, creams, and over-the-counter medicines.  Previous problems you or members of your family have had with the use of anesthetics.  Any blood disorders you have.  Previous surgeries you have had.  Any medical conditions you have.  Whether you are pregnant or may be pregnant. RISKS AND COMPLICATIONS Generally, this is a safe procedure. However, problems may occur, including:  Infection.  Bleeding.  Injury to an abdominal organ, such as the bowel (large intestine), liver, spleen, or bladder.  Low blood pressure (hypotension).  Spreading of cancer, if there are cancer cells in the abdominal fluid.  Mental status changes in people who have liver disease. These changes would be caused by shifts in the balance of fluids and minerals (electrolytes) in the body. BEFORE THE PROCEDURE  Ask your health care provider about:  Changing or stopping your regular medicines. This is especially important if you are taking diabetes medicines or blood thinners.  Taking medicines such as aspirin and ibuprofen. These medicines can thin your blood. Do not take these medicines before your procedure if your health care provider instructs you not to.  A blood sample may be done to determine your blood clotting time.  You will be asked to urinate. PROCEDURE  You may be asked to lie on your back with your head raised (elevated).  To reduce your risk of infection:  Your health care team will wash or sanitize their hands.  Your skin will be washed with soap.  You will be given a medicine to numb the area (local anesthetic).  Your abdominal skin will be punctured with a needle or a scalpel.  A drainage tube will be  inserted through the puncture site. Fluid will drain through the tube into a container.  After enough fluid has been removed, the tube will be removed.  A sample of the fluid will be sent for examination.  A bandage (dressing) will be placed over the puncture site. The procedure may vary among health care providers and hospitals. AFTER THE PROCEDURE  It is your responsibility to get your test results. Ask your health care provider or the department performing the test when your results will be ready.   This information is not intended to replace advice given to you by your health care provider. Make sure you discuss any questions you have with your health care provider.   Document Released: 11/02/2004 Document Revised: 01/08/2015 Document Reviewed: 07/02/2014 Elsevier Interactive Patient Education 2016 [place].  Paracentesis, Care After Refer to this sheet in the next few weeks. These instructions provide you with information about caring for yourself after your procedure. Your health care provider may also give you more specific instructions. Your treatment has been planned according to current medical practices, but problems sometimes occur. Call your health care provider if you have any problems or questions after your procedure. WHAT TO EXPECT AFTER THE PROCEDURE After your procedure, it is common to have a small amount of clear fluid coming from the puncture site. HOME CARE INSTRUCTIONS  Return to  your normal activities as told by your health care provider. Ask your health care provider what activities are safe for you.  Take over-the-counter and prescription medicines only as told by your health care provider.  Do not take baths, swim, or use a hot tub until your health care provider approves.  Follow instructions from your health care provider about:  How to take care of your puncture site.  When and how you should change your bandage (dressing).  When you should  remove your dressing.  Check your puncture area every day signs of infection. Watch for:  Redness, swelling, or pain.  Fluid, blood, or pus.  Keep all follow-up visits as told by your health care provider. This is important. SEEK MEDICAL CARE IF:  You have redness, swelling, or pain at your puncture site.  You start to have more clear fluid coming from your puncture site.  You have blood or pus coming from your puncture site.  You have chills.  You have a fever. SEEK IMMEDIATE MEDICAL CARE IF:  You develop chest pain or shortness of breath.  You develop increasing pain, discomfort, or swelling in your abdomen.  You feel dizzy or light-headed or you pass out.   This information is not intended to replace advice given to you by your health care provider. Make sure you discuss any questions you have with your health care provider.   Document Released: 09/03/2014 Document Reviewed: 09/03/2014 Elsevier Interactive Patient Education [employer].

## 2015-12-06 NOTE — Progress Notes (Signed)
Subjective:  Now no UOP recorded? - creatinine and BUN both down-   Objective Vital signs in last 24 hours: Vitals:   12/05/15 0457 12/05/15 1336 12/05/15 2144 12/06/15 0421  BP: 118/60 133/78 139/73 126/65  Pulse: 66 68 65 73  Resp: 18 18 19 16   Temp: 98 F (36.7 C) 98.4 F (36.9 C) 98.4 F (36.9 C) 97.9 F (36.6 C)  TempSrc: Oral Oral Oral Oral  SpO2: 97% 98% 95% 100%  Weight: 106.4 kg (234 lb 8 oz)   105.6 kg (232 lb 12.9 oz)  Height:       Weight change: -0.769 kg (-1 lb 11.1 oz)  Intake/Output Summary (Last 24 hours) at 12/06/15 1126 Last data filed at 12/06/15 [id number]  Gross per 24 hour  Intake              560 ml  Output                0 ml  Net              560 ml    Assessment/ Plan: Pt is a 55 y.o. yo male with history of AKI who was admitted on 11/20/2015 with recurrent AKI in the setting of cirrhosis/low BP   Assessment/Plan: 1. AKI- creatinine 0.8 in September 2016- had hematuria but all serologies negative- ultrasound shows some asymetry to kidneys- fortunately with supportive care renal function has started to improve so seemed to be c/w ATN- creatinine peaked in the high 6's - now down to 3.98 nonoliguric- no indications for HD.  Creatinine now back on trend of decreasing but slowly- BUN I think up due to blood in GI tract- is not uremic-  2. Volume- third spacing due to liver dz and hypoalbuminemia- making urine on moderated lasix- have changed to PO.  I also added midodrine to see if can get a little more kidney perfusion pressure happening.  UOP not being recorded - weights unreliable 3. Anemia- significant issue- gave ESA  iron stores OK- given transfusion Fri-  EGD results as above- on PPI- hgb slightly lower but higher than it has been- can be monitored as OP 4. Hypokalemia- previously on repletion- I would continue low dose at discharge- added 20 daily  5. Dispo- per primary-  I had set a goal for creatinine less than 4 for discharge-I would be OK with discharge  now - I will arrange follow up with me at [facility]: Basic Metabolic Panel:  Recent Labs Lab 12/02/15 0526  12/04/15 0600 12/05/15 0550 12/06/15 0516  NA 134*  < > 134* 135 135  K 3.6  < > 3.6 3.4* 3.6  CL 98*  < > 99* 98* 99*  CO2 25  < > 25 26 25   GLUCOSE 79  < > 101* 80 82  BUN 69*  < > 75* 76* 68*  CREATININE 4.60*  < > 4.23* 4.10* 3.98*  CALCIUM 8.4*  < > 8.4* 8.8* 8.7*  PHOS 3.6  --  3.8  --  3.6  < > = values in this interval not displayed. Liver Function Tests:  Recent Labs Lab 11/30/15 0523  12/04/15 0600 12/05/15 0550 12/06/15 0516  AST 31  --   --  34  --   ALT 11*  --   --  11*  --   ALKPHOS 103  --   --  100  --   BILITOT 1.2  --   --  1.5*  --   PROT 6.5  --   --  6.9  --   ALBUMIN 2.8*  < > 2.9* 2.9* 3.1*  < > = values in this interval not displayed. No results for input(s): LIPASE, AMYLASE in the last 168 hours.  Recent Labs Lab 12/03/15 1046 12/04/15 0600  AMMONIA 47* 95*   CBC:  Recent Labs Lab 12/01/15 0520 12/02/15 0526 12/03/15 0539 12/04/15 0600 12/04/15 2010 12/05/15 0550  WBC 4.5 4.3 5.2 5.1  --  4.6  HGB 7.7* 8.0* 8.0* 7.5* 8.7* 8.5*  HCT 23.1* 24.0* 24.0* 22.6* 26.1* 25.6*  MCV 95.5 94.9 94.9 95.0  --  91.4  PLT 96* 111* 121* 123*  --  121*   Cardiac Enzymes: No results for input(s): CKTOTAL, CKMB, CKMBINDEX, TROPONINI in the last 168 hours. CBG: No results for input(s): GLUCAP in the last 168 hours.  Iron Studies:  No results for input(s): IRON, TIBC, TRANSFERRIN, FERRITIN in the last 72 hours. Studies/Results: No results found. Medications: Infusions:    Scheduled Medications: . sodium chloride   Intravenous Once  . darbepoetin (ARANESP) injection - NON-DIALYSIS  100 mcg Subcutaneous Q Tue-1800  . folic acid  1 mg Oral Daily  . furosemide  80 mg Oral BID  . lactulose  30 g Oral TID  . levothyroxine  50 mcg Oral QAC breakfast  . lidocaine  1 patch Transdermal Q24H  . midodrine   10 mg Oral BID WC  . multivitamin with minerals  1 tablet Oral Daily  . nadolol  40 mg Oral Daily  . pantoprazole  40 mg Oral BID AC  . potassium chloride  20 mEq Oral Daily  . senna-docusate  2 tablet Oral QHS  . thiamine  100 mg Oral Daily   Or  . thiamine  100 mg Intravenous Daily  . venlafaxine XR  150 mg Oral Q breakfast    have reviewed scheduled and prn medications.  Physical Exam: General: a little slow Heart: RRR Lungs: mostly clear Abdomen: distended Extremities: pitting edema- better     12/06/2015,11:26 AM  LOS: 16 days

## 2015-12-06 NOTE — Progress Notes (Signed)
Went over all discharge information with patient.  All questions answered.  Prescriptions and discharge summary given to patient.  Pt stated he feels able to drive home.  Patient alert and oriented times 4 and steady on his feet.

## 2015-12-18 ENCOUNTER — Encounter: Payer: Self-pay | Admitting: *Deleted

## 2015-12-26 ENCOUNTER — Ambulatory Visit: Payer: BLUE CROSS/BLUE SHIELD | Admitting: Physician Assistant

## 2016-01-16 ENCOUNTER — Telehealth: Payer: Self-pay | Admitting: Family Medicine

## 2016-02-01 NOTE — Telephone Encounter (Signed)
Received call from team health RN regarding patient. She received a call from EMS regarding the patient being deceased and needing someone to sign the death certificate. I advised that in my experience the death certificate is signed by the patients PCP and could sent to Dr Honor Junes office on Monday for signature. She noted she would advise EMS of this. Will forward to PCP as FYI.

## 2016-02-01 DEATH — deceased

## 2016-05-16 IMAGING — CT CT ABD-PELV W/ CM
2 of 5 series · 16 of 46 positions shown, 18 images · IV contrast (omnipaque)
Comparison: CT 08/09/2011, plain film 11/11/2014

CLINICAL DATA: 53-year-old male admitted with gastrointestinal
bleeding and evidence of ileus on plain film.

EXAM:
CT ABDOMEN AND PELVIS WITH CONTRAST
TECHNIQUE: Multidetector CT imaging of the abdomen and pelvis was performed
using the standard protocol following bolus administration of
intravenous contrast.
CONTRAST:  100mL OMNIPAQUE IOHEXOL 300 MG/ML  SOLN

[Series 2: abd/ pelvis 5.0 i30f 1 · axial · 0.87mm/px · z∈[+931,+1356]mm · 13 of 99 slices shown, 15 images]
[im 7/99  soft-tissue]
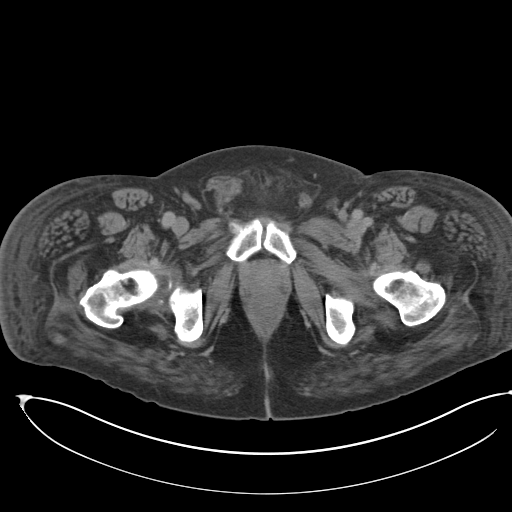
[im 7/99  bone]
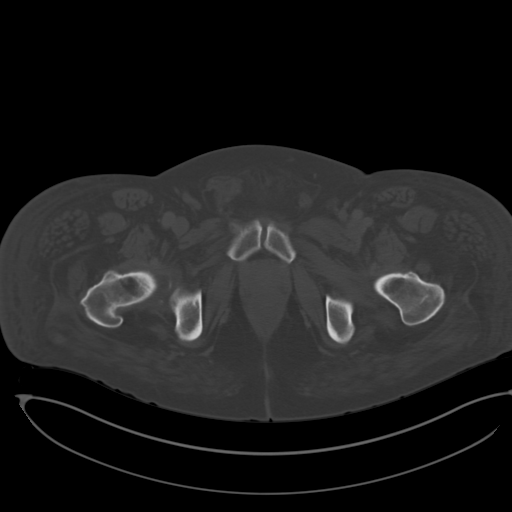
[im 13/99  soft-tissue]
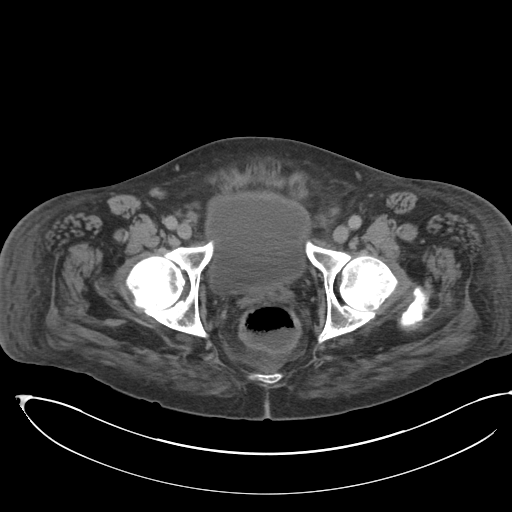
[im 19/99  soft-tissue]
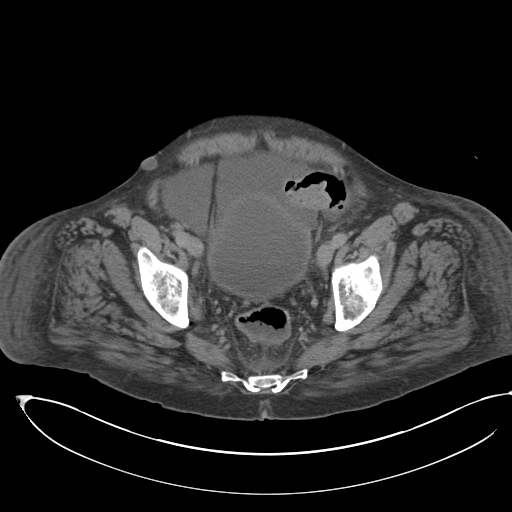
[im 31/99  soft-tissue]
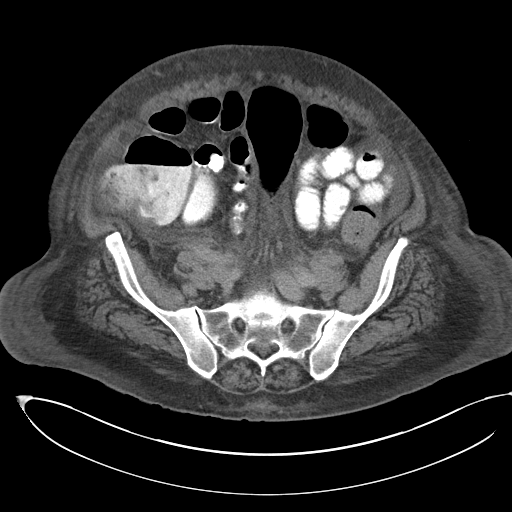
[im 37/99  soft-tissue]
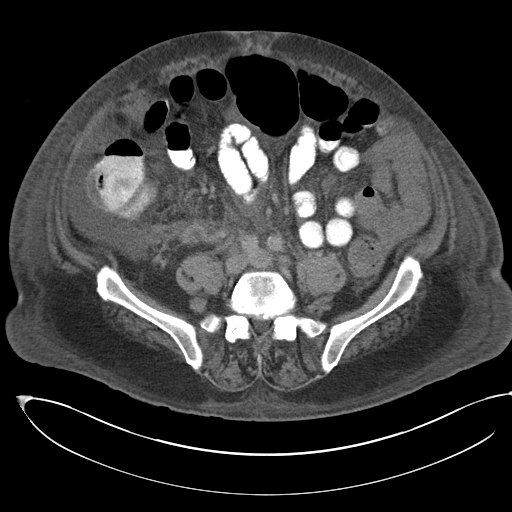
[im 43/99  soft-tissue]
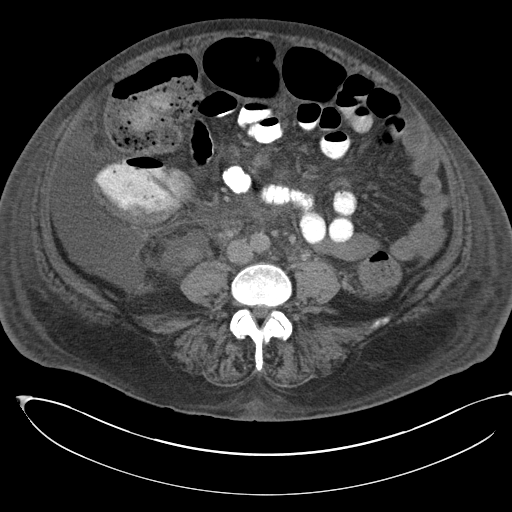
[im 50/99  soft-tissue]
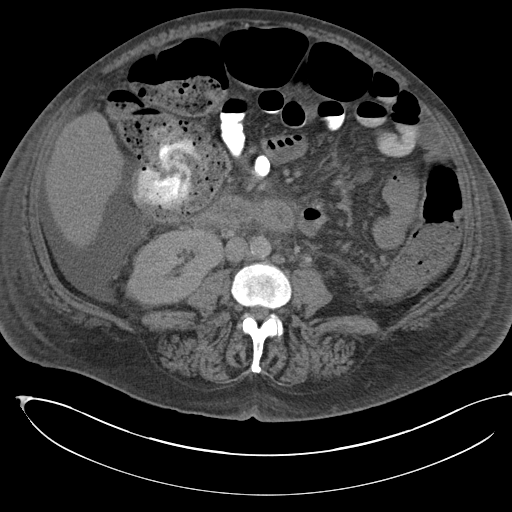
[im 56/99  soft-tissue]
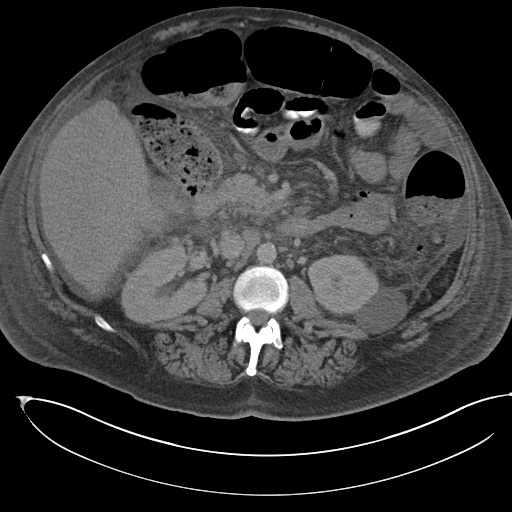
[im 62/99  soft-tissue]
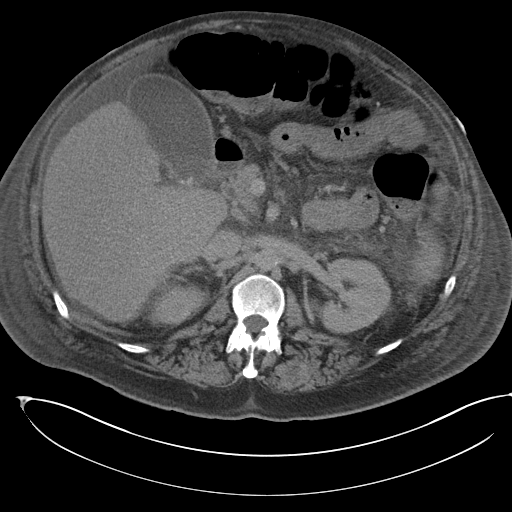
[im 62/99  bone]
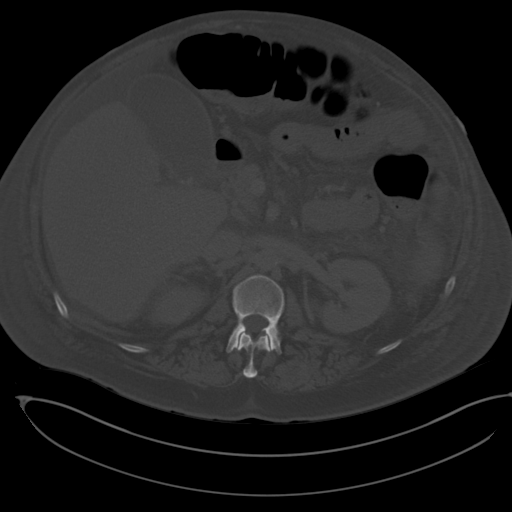
[im 68/99  soft-tissue]
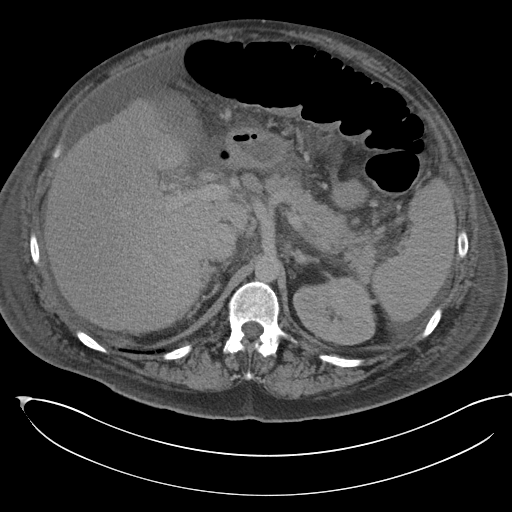
[im 80/99  soft-tissue]
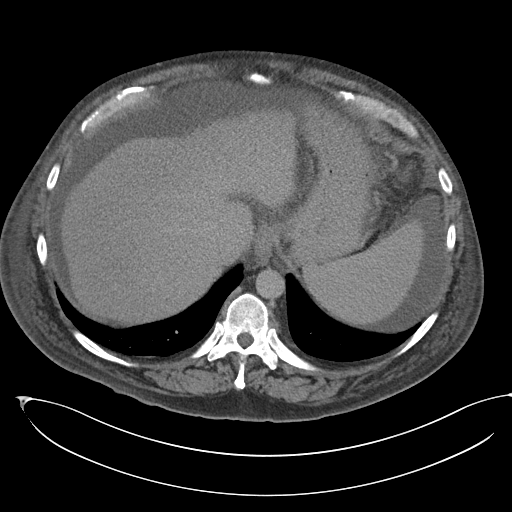
[im 86/99  soft-tissue]
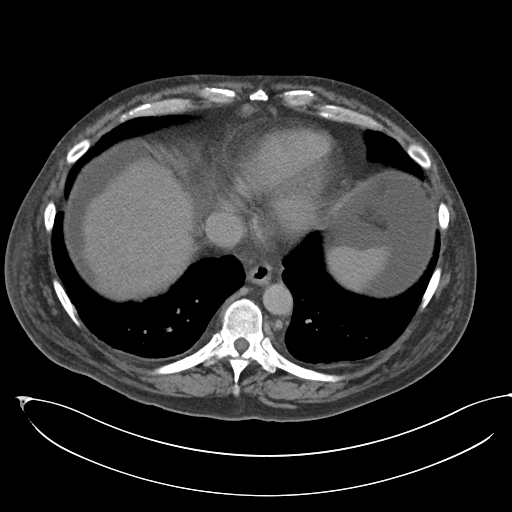
[im 92/99  soft-tissue]
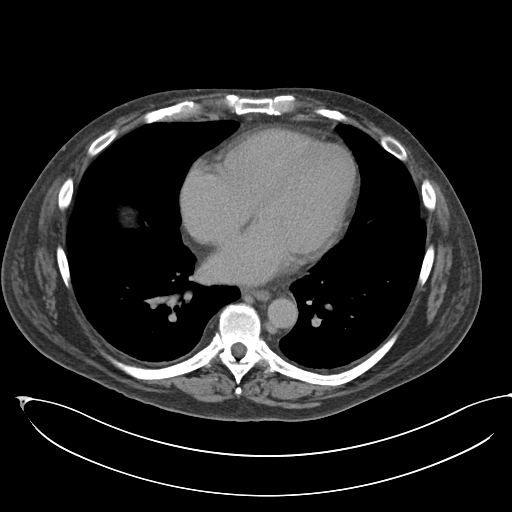

[Series 5: coronals · coronal · 0.92mm/px · 3 of 175 slices shown]
[im 59/175  soft-tissue]
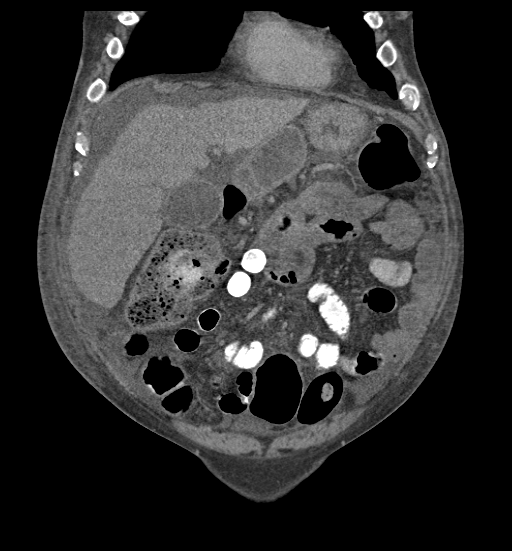
[im 78/175  soft-tissue]
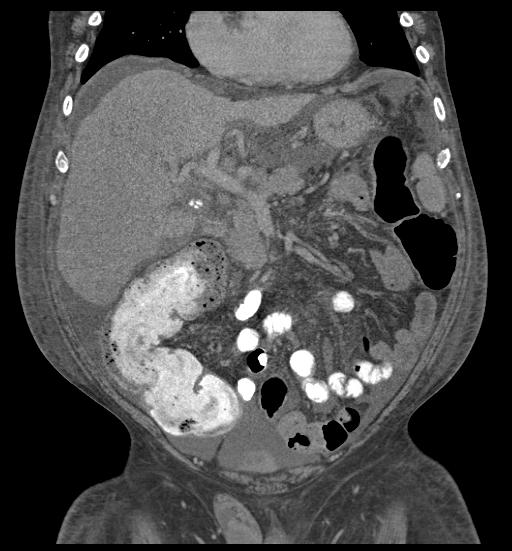
[im 97/175  soft-tissue]
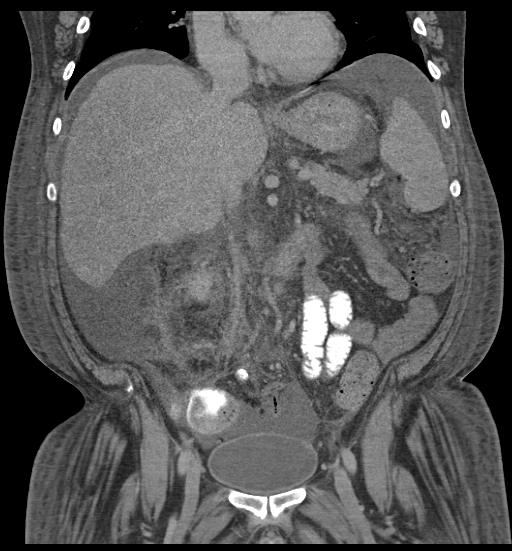

[16 of 46 positions shown; findings below may reference images not displayed]

FINDINGS: Lower chest:

Unremarkable appearance of the soft tissues of the chest wall.

Heart size within normal limits.  No pericardial fluid/thickening.

No lower mediastinal adenopathy.

Unremarkable appearance of the distal esophagus.

Small hiatal hernia.

No confluent airspace disease, pleural fluid, or pneumothorax within
visualized lung.

Abdomen/pelvis:

Cirrhotic changes of the liver. Cranial caudal span of the liver
measures 22 cm. Cranial caudal span of the spleen measures 16 cm.

Unremarkable bilateral adrenal glands.

Unremarkable pancreas.

Radiopaque gallstones within the gallbladder. No gallbladder wall
thickening. There is adjacent fluid though this is likely related to
ascites.

Free fluid throughout the abdomen, most pronounced within the
bilateral pericolic gutter, perisplenic region,. Pack region, and
dependently within the pelvis.

Haziness of the mesenteric fat.

Multiple lymph nodes in the hilum of the liver, including portacaval
nodes and periportal nodes. Additional lymph nodes in the preaortic
nodal station. These are all borderline enlarged.

No evidence of hydronephrosis or nephrolithiasis. Low-density cystic
lesion on the lateral cortex of the left kidney, compatible with
Bosniak 1 cyst.

Unremarkable course of the bilateral ureters. Unremarkable urinary
bladder.

Enteric contrast reaches the transverse colon. Moderate to large
stool burden.

No transition point or point of obstruction identified. No
abnormally distended small bowel. Unremarkable thickness of the
small bowel wall and the colonic wall. Appendix not visualized,
though there does not appear to be any specific focal inflammatory
changes at the cecum.

No significant atherosclerotic changes though there are scattered
calcifications.

Unremarkable appearance of the portal system with patency of the
portal vein. Splenic vein remains patent. No significant esophageal
varices or gastric varices identified.

Right-sided hydrocele.

No displaced fracture.  Mild degenerative changes of the spine.
IMPRESSION: No evidence of bowel obstruction, with enteric contrast reaching the
transverse colon. Moderate stool burden suggests constipation, or
potentially colonic ileus.

Stigmata of cirrhosis and portal hypertension, with
hepatosplenomegaly and ascites.

Cholelithiasis. If there is concern for acute biliary abnormality,
recommend correlation with lab values and patient presentation.

Additional findings as above.
